# Patient Record
Sex: Male | Born: 1950
Health system: Southern US, Community
[De-identification: ages and names within clinical notes are randomized; demographics above are authoritative.]

## PROBLEM LIST (undated history)

## (undated) DIAGNOSIS — K2970 Gastritis, unspecified, without bleeding: Secondary | ICD-10-CM

## (undated) DIAGNOSIS — K3189 Other diseases of stomach and duodenum: Secondary | ICD-10-CM

## (undated) DIAGNOSIS — I10 Essential (primary) hypertension: Secondary | ICD-10-CM

## (undated) DIAGNOSIS — Z86010 Personal history of colonic polyps: Secondary | ICD-10-CM

## (undated) DIAGNOSIS — M1990 Unspecified osteoarthritis, unspecified site: Secondary | ICD-10-CM

## (undated) DIAGNOSIS — I420 Dilated cardiomyopathy: Secondary | ICD-10-CM

## (undated) DIAGNOSIS — R600 Localized edema: Secondary | ICD-10-CM

## (undated) DIAGNOSIS — I482 Chronic atrial fibrillation, unspecified: Secondary | ICD-10-CM

## (undated) DIAGNOSIS — I5022 Chronic systolic (congestive) heart failure: Secondary | ICD-10-CM

## (undated) DIAGNOSIS — B9681 Helicobacter pylori [H. pylori] as the cause of diseases classified elsewhere: Secondary | ICD-10-CM

## (undated) DIAGNOSIS — K5730 Diverticulosis of large intestine without perforation or abscess without bleeding: Secondary | ICD-10-CM

## (undated) DIAGNOSIS — K766 Portal hypertension: Secondary | ICD-10-CM

## (undated) DIAGNOSIS — F1010 Alcohol abuse, uncomplicated: Secondary | ICD-10-CM

## (undated) DIAGNOSIS — K2990 Gastroduodenitis, unspecified, without bleeding: Secondary | ICD-10-CM

## (undated) DIAGNOSIS — D61818 Other pancytopenia: Secondary | ICD-10-CM

## (undated) DIAGNOSIS — K709 Alcoholic liver disease, unspecified: Secondary | ICD-10-CM

## (undated) DIAGNOSIS — E669 Obesity, unspecified: Secondary | ICD-10-CM

## (undated) HISTORY — DX: Other diseases of stomach and duodenum: K31.89

## (undated) HISTORY — DX: Chronic systolic (congestive) heart failure: I50.22

## (undated) HISTORY — DX: Helicobacter pylori (H. pylori) as the cause of diseases classified elsewhere: B96.81

## (undated) HISTORY — DX: Chronic atrial fibrillation, unspecified: I48.20

## (undated) HISTORY — DX: Portal hypertension: K76.6

## (undated) HISTORY — DX: Gastroduodenitis, unspecified, without bleeding: K29.90

## (undated) HISTORY — DX: Localized edema: R60.0

## (undated) HISTORY — DX: Essential (primary) hypertension: I10

## (undated) HISTORY — DX: Alcohol abuse, uncomplicated: F10.10

## (undated) HISTORY — DX: Other pancytopenia: D61.818

## (undated) HISTORY — DX: Alcoholic liver disease, unspecified: K70.9

## (undated) HISTORY — DX: Gastritis, unspecified, without bleeding: K29.70

## (undated) HISTORY — DX: Personal history of colonic polyps: Z86.010

## (undated) HISTORY — DX: Dilated cardiomyopathy: I42.0

## (undated) HISTORY — DX: Diverticulosis of large intestine without perforation or abscess without bleeding: K57.30

---

## 2003-06-28 ENCOUNTER — Emergency Department (HOSPITAL_COMMUNITY): Admission: EM | Admit: 2003-06-28 | Discharge: 2003-06-28 | Payer: Self-pay | Admitting: Emergency Medicine

## 2003-06-30 ENCOUNTER — Emergency Department (HOSPITAL_COMMUNITY): Admission: EM | Admit: 2003-06-30 | Discharge: 2003-06-30 | Payer: Self-pay

## 2004-08-20 ENCOUNTER — Encounter: Admission: RE | Admit: 2004-08-20 | Discharge: 2004-08-20 | Payer: Self-pay | Admitting: Internal Medicine

## 2004-09-22 ENCOUNTER — Ambulatory Visit: Payer: Self-pay | Admitting: "Endocrinology

## 2013-06-13 ENCOUNTER — Ambulatory Visit: Payer: Self-pay | Admitting: Sports Medicine

## 2013-06-21 ENCOUNTER — Ambulatory Visit (INDEPENDENT_AMBULATORY_CARE_PROVIDER_SITE_OTHER): Payer: 59 | Admitting: Sports Medicine

## 2013-06-21 ENCOUNTER — Encounter: Payer: Self-pay | Admitting: Sports Medicine

## 2013-06-21 VITALS — BP 157/95 | Ht 73.0 in | Wt 315.0 lb

## 2013-06-21 DIAGNOSIS — M25569 Pain in unspecified knee: Secondary | ICD-10-CM

## 2013-06-21 DIAGNOSIS — G8929 Other chronic pain: Secondary | ICD-10-CM

## 2013-06-21 NOTE — Progress Notes (Signed)
   Subjective:    Patient ID: Thomas [name], male    DOB: 04-05-1951, 63 y.o.   MRN: [id number]  HPI chief complaint: Bilateral knee pain  Very pleasant 63 year old male comes in today requesting bilateral knee injections. He has been a patient of mine in the past at [place] performed injections before with good symptom relief. Last injections were thought to be about 2 years ago. He describes a diffuse aching discomfort in each knee. Some swelling in the left knee. No recent trauma. No fevers or chills. Symptoms improve at rest but do not resolve  Patient denies any significant past medical history and is on no chronic medications but the patient also does not have a PCP No known drug allergies    Review of Systems     Objective:   Physical Exam Obese, no acute distress. Awake alert and oriented x3. Vital signs are reviewed  Left knee: Range of motion 0-110. 1+ boggy synovitis. No significant effusion. Diffuse tenderness to palpation. Pain but no popping with McMurray's.  Right knee: Range of motion 0-120. No effusion. Diffuse tenderness to palpation. Pain but no popping with McMurray's.  There is bilateral lower extremity edema, 1-2+ on the right about halfway up the lower leg. He has 3+ pitting edema involving the entire left lower leg up to the knee.  Neurovascularly intact distally.  Walking with an obvious limp       Assessment & Plan:  Bilateral knee pain secondary to DJD Bilateral lower extremity edema Elevated blood pressure  I've injected each of his knees with cortisone today. The left knee was injected using an anterior lateral approach. The right knee was injected using an anterior medial approach. He tolerated this without difficulty. He will sign a records release for me to get the records from [facility]. I've also referred him to [facility] to get established there is a patient. He desperately needs  a PCP to evaluate and treat his lower extremity edema. If knee pain persists despite today's injections I would start with updated x-rays of each knee. He will followup with me for ongoing or recalcitrant issues.  Consent obtained and verified. Time-out conducted. Noted no overlying erythema, induration, or other signs of local infection. Skin prepped in a sterile fashion. Topical analgesic spray: Ethyl chloride. Joint: left knee Needle: 22g 1.5 inch Completed without difficulty. Meds: 3cc 1% xylocaine, 1cc (40mg ) depomedrol  Consent obtained and verified. Time-out conducted. Noted no overlying erythema, induration, or other signs of local infection. Skin prepped in a sterile fashion. Topical analgesic spray: Ethyl chloride. Joint: right knee Needle: 22g 1.5 inch Completed without difficulty. Meds: 3cc 1% xylocaine, 1cc (40mg ) depomedrol  Advised to call if fevers/chills, erythema, induration, drainage, or persistent bleeding.   Advised to call if fevers/chills, erythema, induration, drainage, or persistent bleeding.

## 2013-07-12 ENCOUNTER — Encounter: Payer: Self-pay | Admitting: Sports Medicine

## 2013-07-12 ENCOUNTER — Ambulatory Visit (INDEPENDENT_AMBULATORY_CARE_PROVIDER_SITE_OTHER): Payer: 59 | Admitting: Sports Medicine

## 2013-07-12 VITALS — BP 175/104 | HR 77 | Ht 73.0 in | Wt 315.0 lb

## 2013-07-12 DIAGNOSIS — M1610 Unilateral primary osteoarthritis, unspecified hip: Secondary | ICD-10-CM

## 2013-07-12 DIAGNOSIS — M169 Osteoarthritis of hip, unspecified: Secondary | ICD-10-CM

## 2013-07-12 NOTE — Progress Notes (Signed)
   Subjective:    Patient ID: Thomas [name], male    DOB: Jan 02, 1951, 63 y.o.   MRN: [id number]  HPI chief complaint: Right hip pain  Patient comes in today complaining of chronic right hip pain. I've cared for this condition at [facility] in the past. Since his last office visit I have received their notes. 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 He is on long-term disability and has been so since 2006. His symptoms are tolerable with over-the-counter NSAIDs. Pain is diffuse in the hip and worse with activity. Some nighttime pain as well. No previous hip surgeries. In regards to his bilateral knee pain, he notes good improvement with recent cortisone injections.    Review of Systems     Objective:   Physical Exam Well-developed, well-nourished. No acute distress. Awake alert and oriented x3. Vital signs are reviewed. Blood pressure is 175/104  Right hip: Limited passive and active range of motion. Reproducible groin pain with internal rotation. Mild to moderate quad atrophy. Weakness with resisted hip flexion and abduction. Neurovascularly intact distally. Walking with a limp.       Assessment & Plan:  1. Right hip pain secondary to end-stage DJD 2. Improved bilateral knee pain secondary to DJD 3. Elevated blood pressure, likely hypertension  Patient's right hip pain is not severe enough at this point in time that he wants any aggressive treatment. He is happy continuing with his when necessary over-the-counter NSAIDs. I will fill out the appropriate paperwork for his long-term disability. Patient will followup with me in one year or sooner if necessary. I once again discussed the importance of him establishing care with a primary care physician. I expressed my concern about his blood pressure. I provided him with the names of several physicians at [facility]. He reassures me that he will call them for an appointment.

## 2013-08-17 ENCOUNTER — Emergency Department (HOSPITAL_COMMUNITY): Payer: 59

## 2013-08-17 ENCOUNTER — Encounter (HOSPITAL_COMMUNITY): Payer: Self-pay | Admitting: Emergency Medicine

## 2013-08-17 ENCOUNTER — Inpatient Hospital Stay (HOSPITAL_COMMUNITY)
Admission: EM | Admit: 2013-08-17 | Discharge: 2013-08-24 | DRG: 308 | Disposition: A | Discharge status: Home / Self Care | Payer: 59 | Attending: Internal Medicine | Admitting: Internal Medicine

## 2013-08-17 DIAGNOSIS — D61818 Other pancytopenia: Secondary | ICD-10-CM

## 2013-08-17 DIAGNOSIS — I482 Chronic atrial fibrillation, unspecified: Secondary | ICD-10-CM | POA: Diagnosis present

## 2013-08-17 DIAGNOSIS — I5021 Acute systolic (congestive) heart failure: Secondary | ICD-10-CM

## 2013-08-17 DIAGNOSIS — R1310 Dysphagia, unspecified: Secondary | ICD-10-CM | POA: Diagnosis present

## 2013-08-17 DIAGNOSIS — K2900 Acute gastritis without bleeding: Secondary | ICD-10-CM | POA: Diagnosis present

## 2013-08-17 DIAGNOSIS — R195 Other fecal abnormalities: Secondary | ICD-10-CM

## 2013-08-17 DIAGNOSIS — I5022 Chronic systolic (congestive) heart failure: Secondary | ICD-10-CM | POA: Diagnosis present

## 2013-08-17 DIAGNOSIS — D126 Benign neoplasm of colon, unspecified: Secondary | ICD-10-CM | POA: Diagnosis present

## 2013-08-17 DIAGNOSIS — I5031 Acute diastolic (congestive) heart failure: Secondary | ICD-10-CM | POA: Insufficient documentation

## 2013-08-17 DIAGNOSIS — K2970 Gastritis, unspecified, without bleeding: Secondary | ICD-10-CM

## 2013-08-17 DIAGNOSIS — Z6838 Body mass index (BMI) 38.0-38.9, adult: Secondary | ICD-10-CM

## 2013-08-17 DIAGNOSIS — R0602 Shortness of breath: Secondary | ICD-10-CM

## 2013-08-17 DIAGNOSIS — E876 Hypokalemia: Secondary | ICD-10-CM | POA: Diagnosis present

## 2013-08-17 DIAGNOSIS — K709 Alcoholic liver disease, unspecified: Secondary | ICD-10-CM

## 2013-08-17 DIAGNOSIS — R7989 Other specified abnormal findings of blood chemistry: Secondary | ICD-10-CM

## 2013-08-17 DIAGNOSIS — K766 Portal hypertension: Secondary | ICD-10-CM | POA: Diagnosis present

## 2013-08-17 DIAGNOSIS — F1010 Alcohol abuse, uncomplicated: Secondary | ICD-10-CM

## 2013-08-17 DIAGNOSIS — K3189 Other diseases of stomach and duodenum: Secondary | ICD-10-CM

## 2013-08-17 DIAGNOSIS — I4891 Unspecified atrial fibrillation: Principal | ICD-10-CM | POA: Diagnosis present

## 2013-08-17 DIAGNOSIS — Z860100 Personal history of colon polyps, unspecified: Secondary | ICD-10-CM

## 2013-08-17 DIAGNOSIS — I872 Venous insufficiency (chronic) (peripheral): Secondary | ICD-10-CM | POA: Diagnosis present

## 2013-08-17 DIAGNOSIS — K5730 Diverticulosis of large intestine without perforation or abscess without bleeding: Secondary | ICD-10-CM | POA: Diagnosis present

## 2013-08-17 DIAGNOSIS — Z8249 Family history of ischemic heart disease and other diseases of the circulatory system: Secondary | ICD-10-CM

## 2013-08-17 DIAGNOSIS — I959 Hypotension, unspecified: Secondary | ICD-10-CM | POA: Diagnosis not present

## 2013-08-17 DIAGNOSIS — K449 Diaphragmatic hernia without obstruction or gangrene: Secondary | ICD-10-CM | POA: Diagnosis present

## 2013-08-17 DIAGNOSIS — I5043 Acute on chronic combined systolic (congestive) and diastolic (congestive) heart failure: Secondary | ICD-10-CM | POA: Diagnosis present

## 2013-08-17 DIAGNOSIS — R600 Localized edema: Secondary | ICD-10-CM

## 2013-08-17 DIAGNOSIS — R945 Abnormal results of liver function studies: Secondary | ICD-10-CM

## 2013-08-17 DIAGNOSIS — I420 Dilated cardiomyopathy: Secondary | ICD-10-CM | POA: Diagnosis present

## 2013-08-17 DIAGNOSIS — K2990 Gastroduodenitis, unspecified, without bleeding: Secondary | ICD-10-CM

## 2013-08-17 DIAGNOSIS — F1020 Alcohol dependence, uncomplicated: Secondary | ICD-10-CM | POA: Diagnosis present

## 2013-08-17 DIAGNOSIS — E669 Obesity, unspecified: Secondary | ICD-10-CM | POA: Diagnosis present

## 2013-08-17 DIAGNOSIS — I428 Other cardiomyopathies: Secondary | ICD-10-CM | POA: Diagnosis present

## 2013-08-17 DIAGNOSIS — I509 Heart failure, unspecified: Secondary | ICD-10-CM | POA: Diagnosis present

## 2013-08-17 HISTORY — DX: Obesity, unspecified: E66.9

## 2013-08-17 HISTORY — DX: Unspecified osteoarthritis, unspecified site: M19.90

## 2013-08-17 LAB — ABO/RH: ABO/RH(D): A POS

## 2013-08-17 LAB — DIFFERENTIAL
Basophils Absolute: 0 10*3/uL (ref 0.0–0.1)
Basophils Relative: 0 % (ref 0–1)
Eosinophils Absolute: 0 10*3/uL (ref 0.0–0.7)
Eosinophils Relative: 0 % (ref 0–5)
LYMPHS ABS: 0.3 10*3/uL — AB (ref 0.7–4.0)
LYMPHS PCT: 12 % (ref 12–46)
MONOS PCT: 6 % (ref 3–12)
Monocytes Absolute: 0.2 10*3/uL (ref 0.1–1.0)
NEUTROS ABS: 2.3 10*3/uL (ref 1.7–7.7)
NEUTROS PCT: 82 % — AB (ref 43–77)

## 2013-08-17 LAB — COMPREHENSIVE METABOLIC PANEL
ALK PHOS: 64 U/L (ref 39–117)
ALT: 28 U/L (ref 0–53)
AST: 93 U/L — ABNORMAL HIGH (ref 0–37)
Albumin: 3.4 g/dL — ABNORMAL LOW (ref 3.5–5.2)
BILIRUBIN TOTAL: 1.8 mg/dL — AB (ref 0.3–1.2)
BUN: 23 mg/dL (ref 6–23)
CHLORIDE: 87 meq/L — AB (ref 96–112)
CO2: 24 mEq/L (ref 19–32)
Calcium: 8.8 mg/dL (ref 8.4–10.5)
Creatinine, Ser: 0.7 mg/dL (ref 0.50–1.35)
GFR calc non Af Amer: >90 mL/min (ref >90)
GLUCOSE: 168 mg/dL — AB (ref 70–99)
POTASSIUM: 3.6 meq/L — AB (ref 3.7–5.3)
SODIUM: 133 meq/L — AB (ref 137–147)
TOTAL PROTEIN: 6.8 g/dL (ref 6.0–8.3)

## 2013-08-17 LAB — CBC
HCT: 36.2 % — ABNORMAL LOW (ref 39.0–52.0)
HEMOGLOBIN: 13.1 g/dL (ref 13.0–17.0)
MCH: 36.1 pg — ABNORMAL HIGH (ref 26.0–34.0)
MCHC: 36.2 g/dL — ABNORMAL HIGH (ref 30.0–36.0)
MCV: 99.7 fL (ref 78.0–100.0)
PLATELETS: 49 10*3/uL — AB (ref 150–400)
RBC: 3.63 MIL/uL — AB (ref 4.22–5.81)
RDW: 14.1 % (ref 11.5–15.5)
WBC: 2.7 10*3/uL — AB (ref 4.0–10.5)

## 2013-08-17 LAB — I-STAT TROPONIN, ED: TROPONIN I, POC: 0.01 ng/mL (ref 0.00–0.08)

## 2013-08-17 LAB — D-DIMER, QUANTITATIVE (NOT AT ARMC): D DIMER QUANT: 1.52 ug{FEU}/mL — AB (ref 0.00–0.48)

## 2013-08-17 LAB — MAGNESIUM: MAGNESIUM: 1.5 mg/dL (ref 1.5–2.5)

## 2013-08-17 LAB — PRO B NATRIURETIC PEPTIDE: Pro B Natriuretic peptide (BNP): 1234 pg/mL — ABNORMAL HIGH (ref 0–125)

## 2013-08-17 LAB — TSH: TSH: 1.65 u[IU]/mL (ref 0.350–4.500)

## 2013-08-17 MED ORDER — LORAZEPAM 2 MG/ML IJ SOLN
1.0000 mg | Freq: Four times a day (QID) | INTRAMUSCULAR | Status: AC | PRN
  Administered 2013-08-17: 1 mg via INTRAVENOUS
  Filled 2013-08-17: qty 1

## 2013-08-17 MED ORDER — ADULT MULTIVITAMIN W/MINERALS CH
1.0000 | ORAL_TABLET | Freq: Every day | ORAL | Status: DC
  Administered 2013-08-18 – 2013-08-24 (×8): 1 via ORAL
  Filled 2013-08-17 (×8): qty 1

## 2013-08-17 MED ORDER — METOPROLOL TARTRATE 1 MG/ML IV SOLN
2.5000 mg | INTRAVENOUS | Status: AC
  Administered 2013-08-17: 2.5 mg via INTRAVENOUS
  Filled 2013-08-17: qty 5

## 2013-08-17 MED ORDER — DILTIAZEM HCL 100 MG IV SOLR
5.0000 mg/h | INTRAVENOUS | Status: DC
  Administered 2013-08-17: 5 mg/h via INTRAVENOUS
  Filled 2013-08-17: qty 100

## 2013-08-17 MED ORDER — SODIUM CHLORIDE 0.9 % IV BOLUS (SEPSIS)
500.0000 mL | Freq: Once | INTRAVENOUS | Status: AC
  Administered 2013-08-17: 500 mL via INTRAVENOUS

## 2013-08-17 MED ORDER — FOLIC ACID 1 MG PO TABS
1.0000 mg | ORAL_TABLET | Freq: Every day | ORAL | Status: DC
  Administered 2013-08-18: 1 mg via ORAL
  Filled 2013-08-17 (×2): qty 1

## 2013-08-17 MED ORDER — POTASSIUM CHLORIDE CRYS ER 20 MEQ PO TBCR
40.0000 meq | EXTENDED_RELEASE_TABLET | Freq: Once | ORAL | Status: AC
  Administered 2013-08-18: 40 meq via ORAL
  Filled 2013-08-17: qty 2

## 2013-08-17 MED ORDER — VITAMIN B-1 100 MG PO TABS
100.0000 mg | ORAL_TABLET | Freq: Every day | ORAL | Status: DC
  Administered 2013-08-18 – 2013-08-24 (×7): 100 mg via ORAL
  Filled 2013-08-17 (×8): qty 1

## 2013-08-17 MED ORDER — METOPROLOL TARTRATE 12.5 MG HALF TABLET
12.5000 mg | ORAL_TABLET | Freq: Two times a day (BID) | ORAL | Status: DC
  Administered 2013-08-18 (×2): 12.5 mg via ORAL
  Filled 2013-08-17 (×5): qty 1

## 2013-08-17 MED ORDER — FUROSEMIDE 10 MG/ML IJ SOLN
40.0000 mg | Freq: Every day | INTRAMUSCULAR | Status: DC
  Administered 2013-08-17 – 2013-08-22 (×6): 40 mg via INTRAVENOUS
  Filled 2013-08-17 (×6): qty 4

## 2013-08-17 MED ORDER — LORAZEPAM 1 MG PO TABS: 1.0000 mg | ORAL_TABLET | Freq: Four times a day (QID) | ORAL | Status: AC | PRN

## 2013-08-17 MED ORDER — THIAMINE HCL 100 MG/ML IJ SOLN
100.0000 mg | Freq: Every day | INTRAMUSCULAR | Status: DC
  Administered 2013-08-18: 12:00:00 via INTRAVENOUS
  Filled 2013-08-17 (×7): qty 1

## 2013-08-17 NOTE — H&P (Signed)
Thomas [name] is an 63 y.o. male.   Chief Complaint: Afib HPI:   The patient is a 6 male with a history of obesity, ETOH abuse and arthritis.  He reports feeling dizzy and wobbly in the legs for the last two days, 2 weeks of LEE a lot worse in the left leg and some dark tarry stools for two weeks.  He reports a decreased appetite as well.  He takes no medications and has not seen a PCP in a long time.  The patient currently denies nausea, vomiting, fever, chest pain, shortness of breath, orthopnea, dizziness, PND, cough, congestion, abdominal pain, hematochezia, melena, lower extremity edema, claudication.   Past Medical History  Diagnosis Date  . Obesity   . Arthritis     History reviewed. No pertinent past surgical history.  Family History  Problem Relation Age of Onset  . Hypertension Father    Social History:  reports that he has never smoked. He has never used smokeless tobacco. He reports that he drinks about 18 ounces of alcohol per week. He reports that he does not use illicit drugs.  Allergies: No Known Allergies   (Not in a hospital admission)  Results for orders placed during the hospital encounter of 08/17/13 (from the past 48 hour(s))  CBC     Status: Abnormal   Collection Time    08/17/13  1:40 PM      Result Value Ref Range   WBC 2.7 (*) 4.0 - 10.5 K/uL   Comment: REPEATED TO VERIFY   RBC 3.63 (*) 4.22 - 5.81 MIL/uL   Hemoglobin 13.1  13.0 - 17.0 g/dL   HCT 36.2 (*) 39.0 - 52.0 %   MCV 99.7  78.0 - 100.0 fL   MCH 36.1 (*) 26.0 - 34.0 pg   MCHC 36.2 (*) 30.0 - 36.0 g/dL   RDW 14.1  11.5 - 15.5 %   Platelets 49 (*) 150 - 400 K/uL   Comment: REPEATED TO VERIFY     SPECIMEN CHECKED FOR CLOTS     PLATELET COUNT CONFIRMED BY SMEAR  DIFFERENTIAL     Status: Abnormal   Collection Time    08/17/13  1:40 PM      Result Value Ref Range   Neutrophils Relative % 82 (*) 43 - 77 %   Neutro Abs 2.3  1.7 - 7.7 K/uL   Lymphocytes Relative 12  12 - 46 %   Lymphs  Abs 0.3 (*) 0.7 - 4.0 K/uL   Monocytes Relative 6  3 - 12 %   Monocytes Absolute 0.2  0.1 - 1.0 K/uL   Eosinophils Relative 0  0 - 5 %   Eosinophils Absolute 0.0  0.0 - 0.7 K/uL   Basophils Relative 0  0 - 1 %   Basophils Absolute 0.0  0.0 - 0.1 K/uL  COMPREHENSIVE METABOLIC PANEL     Status: Abnormal   Collection Time    08/17/13  1:40 PM      Result Value Ref Range   Sodium 133 (*) 137 - 147 mEq/L   Potassium 3.6 (*) 3.7 - 5.3 mEq/L   Chloride 87 (*) 96 - 112 mEq/L   CO2 24  19 - 32 mEq/L   Glucose, Bld 168 (*) 70 - 99 mg/dL   BUN 23  6 - 23 mg/dL   Creatinine, Ser 0.70  0.50 - 1.35 mg/dL   Calcium 8.8  8.4 - 10.5 mg/dL   Total Protein 6.8  6.0 -  8.3 g/dL   Albumin 3.4 (*) 3.5 - 5.2 g/dL   AST 93 (*) 0 - 37 U/L   ALT 28  0 - 53 U/L   Alkaline Phosphatase 64  39 - 117 U/L   Total Bilirubin 1.8 (*) 0.3 - 1.2 mg/dL   GFR calc non Af Amer >90  >90 mL/min   GFR calc Af Amer >90  >90 mL/min   Comment: (NOTE)     The eGFR has been calculated using the CKD EPI equation.     This calculation has not been validated in all clinical situations.     eGFR's persistently <90 mL/min signify possible Chronic Kidney     Disease.  PRO B NATRIURETIC PEPTIDE     Status: Abnormal   Collection Time    08/17/13  1:46 PM      Result Value Ref Range   Pro B Natriuretic peptide (BNP) 1234.0 (*) 0 - 125 pg/mL  I-STAT TROPOININ, ED     Status: None   Collection Time    08/17/13  2:16 PM      Result Value Ref Range   Troponin i, poc 0.01  0.00 - 0.08 ng/mL   Comment 3            Comment: Due to the release kinetics of cTnI,     a negative result within the first hours     of the onset of symptoms does not rule out     myocardial infarction with certainty.     If myocardial infarction is still suspected,     repeat the test at appropriate intervals.   Dg Chest 2 View  08/17/2013   CLINICAL DATA:  Shortness of breath. Dizziness. Chest pain. Lower extremity swelling.  EXAM: CHEST  2 VIEW   COMPARISON:  None.  FINDINGS: The heart size and mediastinal contours are within normal limits. Both lungs are clear. The visualized skeletal structures are unremarkable.  IMPRESSION: No active cardiopulmonary disease.   Electronically Signed   By: Earle Gell M.D.   On: 08/17/2013 15:02    Review of Systems  Constitutional: Negative for fever and diaphoresis.  HENT: Negative for congestion.   Respiratory: Positive for cough and shortness of breath (with exertion). Negative for sputum production.   Cardiovascular: Positive for leg swelling. Negative for chest pain, palpitations, orthopnea and PND.  Gastrointestinal: Positive for melena. Negative for nausea, vomiting, abdominal pain and blood in stool.  Genitourinary: Negative for hematuria.  Musculoskeletal: Positive for joint pain (Hip and knee).  Neurological: Positive for dizziness.  All other systems reviewed and are negative.   Blood pressure 114/70, pulse 57, temperature 97.8 F (36.6 C), temperature source Oral, resp. rate 15, height 6' (1.829 m), weight 330 lb (149.687 kg), SpO2 98.00%. Physical Exam  Nursing note and vitals reviewed. Constitutional: He is oriented to person, place, and time. He appears well-developed. No distress.  Obese  HENT:  Head: Normocephalic and atraumatic.  Mouth/Throat: Oropharynx is clear and moist. No oropharyngeal exudate.  Eyes: EOM are normal. Pupils are equal, round, and reactive to light. No scleral icterus.  Neck: Normal range of motion. Neck supple.  Cardiovascular: S1 normal and S2 normal.  An irregularly irregular rhythm present. Tachycardia present.   No murmur heard. Pulses:      Radial pulses are 2+ on the right side, and 2+ on the left side.       Dorsalis pedis pulses are 2+ on the right side, and 2+ on the  left side.  No carotid Bruit  Respiratory: Effort normal and breath sounds normal. He has no wheezes. He has no rales.  GI: Soft. Bowel sounds are normal. He exhibits no  distension. There is no tenderness.  Musculoskeletal: He exhibits edema.  Left leg has 3+ pitting edema and trace in the right.  Neurological: He is alert and oriented to person, place, and time. He exhibits normal muscle tone.  Skin: Skin is warm and dry.  Psychiatric: He has a normal mood and affect.     Assessment/Plan Principal Problem:   Atrial fibrillation with RVR His BP is limiting the use of cardizem.  Will try lopressor.   May need to start amiodarone.  Hematology consult before adding heparin.  TEE/DCCV at some point.  Ischemic eval.  Check Echo.   Active Problems:   SOB (shortness of breath)   Acute diastolic congestive heart failure  BNP elevated will add lasix IV 40 mg daily.  Checking Echo   Pancytopenia  Hematology consult requested.  Consider megaloblastic anemia from excessive ETOH.    ETOH abuse  Check a G98 and folic acid.   CIWA order set.   Hypokalemia  Replace   Edema of left lower extremity  Severe edema compared to right leg. Check ddimer.  LE venous dopplers.   Elevated AST  Could be from [place], [facility] 08/17/2013, 4:58 PM

## 2013-08-17 NOTE — Consult Note (Signed)
[place] [phone]   [phone]  CONSULT NOTE  REFERRING PHYSICIAN: Sueanne Margarita, MD  REASON FOR CONSULTATION:   To aid in the management thrombocytopenia and leukopenia   HPI Thomas [name] is a 63 y.o. male with  history of ETOH abuse,  Obesity, lower extremity edema and DJD who started feeling  dizzy for the past two days,  Progressively worsening bilateral lower extremity swelling left more than the right for the past two weeks. He also c/o passing bright red blood in the stool with intermittent black tarry stool for the past 2 weeks. He reports  decreased appetite as well. He takes aleve for his right hip pain. He says he has not seen a PCP in a long time.In ER he was found to have Afib with RVR and cardiology was consulted. His labs revealed wbc of 2.7 with ANC of 2300, platelet count 49000. He also c/o intermittent gum bleeding while brushing teeth.   The patient currently denies nausea, vomiting, fever, chest pain, shortness of breath, orthopnea, PND, cough, congestion, abdominal pain.He c/o discomfort in both the lower extremities   Past Medical History  Diagnosis Date  . Obesity   . Arthritis     History reviewed. No pertinent past surgical history.  Family History  Problem Relation Age of Onset  . Hypertension Father     Social History History  Substance Use Topics  . Smoking status: Never Smoker   . Smokeless tobacco: Never Used  . Alcohol Use: 18.0 oz/week    30 Cans of beer per week     Comment: daily    No Known Allergies  Current Facility-Administered Medications  Medication Dose Route Frequency Provider Last Rate Last Dose  . diltiazem (CARDIZEM) 100 mg in dextrose 5 % 100 mL infusion  5-15 mg/hr Intravenous Titrated Charles B. Karle Starch, MD   10 mg/hr at 08/17/13 1509    Review of Systems  A 10 point review of symptoms were assessed and pertinent symptoms were mentioned in HPI  Physical  Exam  GENERAL:alert and oriented x3,  no distress, obese and well developed SKIN: no rashes or significant lesions HEAD: Normocephalic, atraumatic EYES: PERRLA, EOMI, Conjunctiva are pink and non-injected, sclera slightly icteric EARS: External ears normal OROPHARYNX:no erythema, lips, buccal mucosa, and tongue normal and mucous membranes are moist  NECK: supple, no adenopathy, no JVD, no stridor, non-tender LYMPH:  ?left supraclavicular  lymphadenopathy,  LUNGS: poor inspiratory effort, coarse sounds heard anteriorly HEART: positive for irregular rhythem and tachycardia ABDOMEN: soft, obese and normal bowel sounds,no hepatosplenomegaly EXTREMITIES: Bil. Lower extremity edema left more than the right. no clubbing and no cyanosis  NEURO: alert & oriented x 3 with fluent speech, no focal motor/sensory deficits, gait normal   PERFORMANCE STATUS: ECOG 2 - Symptomatic, <50% confined to bed   LABORATORY DATA: Lab Results  Component Value Date   WBC 2.7* 08/17/2013   HGB 13.1 08/17/2013   HCT 36.2* 08/17/2013   MCV 99.7 08/17/2013   PLT 49* 08/17/2013     RADIOGRAPHIC STUDIES: Dg Chest 2 View  08/17/2013   CLINICAL DATA:  Shortness of breath. Dizziness. Chest pain. Lower extremity swelling.  EXAM: CHEST  2 VIEW  COMPARISON:  None.  FINDINGS: The heart size and mediastinal contours are within normal limits. Both lungs are clear. The visualized skeletal structures are unremarkable.  IMPRESSION: No active cardiopulmonary disease.   Electronically Signed   By: Earle Gell M.D.   On: 08/17/2013 15:02  ASSESSMENT/PLAN: 1. Pancytopenia most likely secondary alcohol induced bonemarrow suppression compounded by NSAID use. F/u with vitamin b12, TSH,  ANA levels, folate, retic count and LDH levels to r/o other etiologies.  2. F/u with stool occult and recommend GI evaluation   3.He can be started on anticoagulation for AFIB, once cleared by GI and platelet count improves at least to  60,000  4. Two units of platelet transfusion were ordered  5. Will obtain ultrasound of neck to assess for left supra clavicular lymphadenopathy  6. F/u with bil.  venous doppler studies ordered to R/o DVT  7. Counselled on stop using alcohol and to avoid NSAID's  8. Monitor daily CBC and DIff  Thank you so much for allowing me to participate in the care of Thomas [name]. I will continue to follow up the patient with you and assist in his care.  I spent 35 minutes counseling the patient face to face. The total time spent in the appointment was 50 minutes    Wilmon Arms, MD Hematology/ Medical Oncology 08/17/2013, 7:23 PM

## 2013-08-17 NOTE — ED Provider Notes (Signed)
CSN: [id number]     Arrival date & time 08/17/13  1323 History   First MD Initiated Contact with Patient 08/17/13 1409     Chief Complaint  Patient presents with  . Leg Pain  . Dizziness     (Consider location/radiation/quality/duration/timing/severity/associated sxs/prior Treatment) Patient is a 63 y.o. male presenting with leg pain and dizziness.  Leg Pain Dizziness  Pt with no known PMH who does not go to a doctor regularly reports about a week of general weakness, legs giving out from time to time but no CP, SOB, vomiting. He was feeling particularly bad today and so came to the ED for eval where he was found to be in atrial fibrillation with RVR.   Past Medical History  Diagnosis Date  . Obesity   . Arthritis    History reviewed. No pertinent past surgical history. Family History  Problem Relation Age of Onset  . Hypertension Father    History  Substance Use Topics  . Smoking status: Never Smoker   . Smokeless tobacco: Never Used  . Alcohol Use: 18.0 oz/week    30 Cans of beer per week     Comment: daily    Review of Systems  Neurological: Positive for dizziness.   All other systems reviewed and are negative except as noted in HPI.     Allergies  Review of patient's allergies indicates no known allergies.  Home Medications   Prior to Admission medications   Medication Sig Start Date End Date Taking? Authorizing Provider  naproxen sodium (ANAPROX) 220 MG tablet Take 220 mg by mouth every 8 (eight) hours as needed (pain).   Yes Historical Provider, MD   BP 94/59  Pulse 120  Temp(Src) 97.8 F (36.6 C) (Oral)  Resp 13  Ht 6' (1.829 m)  Wt 330 lb (149.687 kg)  BMI 44.75 kg/m2  SpO2 100% Physical Exam  Nursing note and vitals reviewed. Constitutional: He is oriented to person, place, and time. He appears well-developed and well-nourished.  HENT:  Head: Normocephalic and atraumatic.  Eyes: EOM are normal. Pupils are equal, round, and reactive to light.   Neck: Normal range of motion. Neck supple.  Cardiovascular: Normal heart sounds and intact distal pulses.  Tachycardia present.   Pulmonary/Chest: Effort normal and breath sounds normal.  Abdominal: Bowel sounds are normal. He exhibits no distension. There is no tenderness.  Musculoskeletal: Normal range of motion. He exhibits no edema and no tenderness.  Neurological: He is alert and oriented to person, place, and time. He has normal strength. No cranial nerve deficit or sensory deficit.  Skin: Skin is warm and dry. No rash noted.  Psychiatric: He has a normal mood and affect.    ED Course  Procedures (including critical care time) Labs Review Labs Reviewed  CBC - Abnormal; Notable for the following:    WBC 2.7 (*)    RBC 3.63 (*)    HCT 36.2 (*)    MCH 36.1 (*)    MCHC 36.2 (*)    Platelets 49 (*)    All other components within normal limits  DIFFERENTIAL - Abnormal; Notable for the following:    Neutrophils Relative % 82 (*)    Lymphs Abs 0.3 (*)    All other components within normal limits  COMPREHENSIVE METABOLIC PANEL - Abnormal; Notable for the following:    Sodium 133 (*)    Potassium 3.6 (*)    Chloride 87 (*)    Glucose, Bld 168 (*)  Albumin 3.4 (*)    AST 93 (*)    Total Bilirubin 1.8 (*)    All other components within normal limits  PRO B NATRIURETIC PEPTIDE - Abnormal; Notable for the following:    Pro B Natriuretic peptide (BNP) 1234.0 (*)    All other components within normal limits  Randolm Idol, ED    Imaging Review Dg Chest 2 View  08/17/2013   CLINICAL DATA:  Shortness of breath. Dizziness. Chest pain. Lower extremity swelling.  EXAM: CHEST  2 VIEW  COMPARISON:  None.  FINDINGS: The heart size and mediastinal contours are within normal limits. Both lungs are clear. The visualized skeletal structures are unremarkable.  IMPRESSION: No active cardiopulmonary disease.   Electronically Signed   By: Earle Gell M.D.   On: 08/17/2013 15:02     EKG  Interpretation   Date/Time:  Thursday August 17 2013 13:43:14 EDT Ventricular Rate:  159 PR Interval:    QRS Duration: 86 QT Interval:  308 QTC Calculation: 501 R Axis:   22 Text Interpretation:  Atrial fibrillation with rapid ventricular response  with premature ventricular or aberrantly conducted complexes Minimal  voltage criteria for LVH, may be normal variant Abnormal ECG No old  tracing to compare Confirmed by [facility]  MD, Juanda Crumble [phone]) on 08/17/2013  2:08:58 PM      MDM   Final diagnoses:  Atrial fibrillation with rapid ventricular response    Pt started on cardizem drip, but BP dropped. Drip held and IVF bolus given. Spoke with Cards who will evaluate the patient in the ED.     Charles B. Karle Starch, MD 08/17/13 [id number]

## 2013-08-17 NOTE — H&P (Signed)
Patient seen, interviewed and examined and chart reviewed. Presented to the ER with complaints of feeling dizzy and unsteady on his feet for several days.  He also complains of LLE edema and dark stools for several weeks.  He drinks a significant amount of Alcohol up to 30 beers weekly.  Found in ER to have rapid afib and was placed on IV Cardizem but dropped his BP and was stopped.  Currently his HR is 130bpm and SBP back to 120's off Cardizem.  He has 1+ edema of the RLE and 3+ edema of the LLE.  His labs are remarkable for low WBC at 2.9 and thrombocytopenia which may be due to bone marrow suppression from ETOH abuse.  Will admit to tele bed and try low dose IV Metoprolol.  If HR not controlled then will need to start IV Amio gtt.  At this time will hold on anticoagulation in light of recent history of black stools and thrombocytopenia until seen tonight by Hematology.  Will get a 2D echo to assess LVF.  Start IV Lasix for acute diastolic CHF with elevated proBNP and LE edema.  LE venous doppler of LLE to rule out DVT.  DT prophylaxis.  SCD's for DVT prophylaxis once DVT ruled out by LE venous doppler

## 2013-08-17 NOTE — ED Notes (Addendum)
Pt reports bilateral leg pain and dizziness x 2 days. Reports difficulty ambulating due to his legs "not working right." grips equal, no facial droop or arm drift noted at triage. HR 159 at triage, denies hx of afib

## 2013-08-18 ENCOUNTER — Inpatient Hospital Stay (HOSPITAL_COMMUNITY): Payer: 59

## 2013-08-18 DIAGNOSIS — F1010 Alcohol abuse, uncomplicated: Secondary | ICD-10-CM

## 2013-08-18 DIAGNOSIS — I059 Rheumatic mitral valve disease, unspecified: Secondary | ICD-10-CM

## 2013-08-18 DIAGNOSIS — K709 Alcoholic liver disease, unspecified: Secondary | ICD-10-CM | POA: Diagnosis present

## 2013-08-18 DIAGNOSIS — R609 Edema, unspecified: Secondary | ICD-10-CM

## 2013-08-18 DIAGNOSIS — E876 Hypokalemia: Secondary | ICD-10-CM

## 2013-08-18 DIAGNOSIS — R195 Other fecal abnormalities: Secondary | ICD-10-CM | POA: Diagnosis present

## 2013-08-18 DIAGNOSIS — D61818 Other pancytopenia: Secondary | ICD-10-CM

## 2013-08-18 DIAGNOSIS — R791 Abnormal coagulation profile: Secondary | ICD-10-CM

## 2013-08-18 LAB — CBC WITH DIFFERENTIAL/PLATELET
BASOS ABS: 0 10*3/uL (ref 0.0–0.1)
Basophils Relative: 1 % (ref 0–1)
Eosinophils Absolute: 0 10*3/uL (ref 0.0–0.7)
Eosinophils Relative: 1 % (ref 0–5)
HCT: 33.4 % — ABNORMAL LOW (ref 39.0–52.0)
Hemoglobin: 11.5 g/dL — ABNORMAL LOW (ref 13.0–17.0)
Lymphocytes Relative: 27 % (ref 12–46)
Lymphs Abs: 0.7 10*3/uL (ref 0.7–4.0)
MCH: 35.4 pg — ABNORMAL HIGH (ref 26.0–34.0)
MCHC: 34.4 g/dL (ref 30.0–36.0)
MCV: 102.8 fL — ABNORMAL HIGH (ref 78.0–100.0)
Monocytes Absolute: 0.2 10*3/uL (ref 0.1–1.0)
Monocytes Relative: 7 % (ref 3–12)
NEUTROS ABS: 1.5 10*3/uL — AB (ref 1.7–7.7)
NEUTROS PCT: 64 % (ref 43–77)
Platelets: 79 10*3/uL — ABNORMAL LOW (ref 150–400)
RBC: 3.25 MIL/uL — ABNORMAL LOW (ref 4.22–5.81)
RDW: 14.2 % (ref 11.5–15.5)
WBC: 2.4 10*3/uL — ABNORMAL LOW (ref 4.0–10.5)

## 2013-08-18 LAB — BASIC METABOLIC PANEL
BUN: 20 mg/dL (ref 6–23)
CHLORIDE: 92 meq/L — AB (ref 96–112)
CO2: 32 meq/L (ref 19–32)
Calcium: 8.5 mg/dL (ref 8.4–10.5)
Creatinine, Ser: 0.8 mg/dL (ref 0.50–1.35)
GFR calc non Af Amer: >90 mL/min (ref >90)
GLUCOSE: 102 mg/dL — AB (ref 70–99)
Potassium: 3.2 mEq/L — ABNORMAL LOW (ref 3.7–5.3)
Sodium: 139 mEq/L (ref 137–147)

## 2013-08-18 LAB — LACTATE DEHYDROGENASE: LDH: 476 U/L — ABNORMAL HIGH (ref 94–250)

## 2013-08-18 LAB — CBC
HCT: 35.6 % — ABNORMAL LOW (ref 39.0–52.0)
Hemoglobin: 12.5 g/dL — ABNORMAL LOW (ref 13.0–17.0)
MCH: 35.6 pg — ABNORMAL HIGH (ref 26.0–34.0)
MCHC: 35.1 g/dL (ref 30.0–36.0)
MCV: 101.4 fL — AB (ref 78.0–100.0)
PLATELETS: 88 10*3/uL — AB (ref 150–400)
RBC: 3.51 MIL/uL — ABNORMAL LOW (ref 4.22–5.81)
RDW: 14.4 % (ref 11.5–15.5)
WBC: 3 10*3/uL — AB (ref 4.0–10.5)

## 2013-08-18 LAB — RETICULOCYTES
RBC.: 3.25 MIL/uL — AB (ref 4.22–5.81)
RETIC CT PCT: 0.3 % — AB (ref 0.4–3.1)
Retic Count, Absolute: 9.8 10*3/uL — ABNORMAL LOW (ref 19.0–186.0)

## 2013-08-18 LAB — VITAMIN B12: VITAMIN B 12: 232 pg/mL (ref 211–911)

## 2013-08-18 LAB — FOLATE: Folate: 3 ng/mL — ABNORMAL LOW

## 2013-08-18 LAB — HEPARIN LEVEL (UNFRACTIONATED): Heparin Unfractionated: 0.72 IU/mL — ABNORMAL HIGH (ref 0.30–0.70)

## 2013-08-18 MED ORDER — AMIODARONE HCL IN DEXTROSE 360-4.14 MG/200ML-% IV SOLN
30.0000 mg/h | INTRAVENOUS | Status: DC
  Administered 2013-08-18 – 2013-08-22 (×6): 30 mg/h via INTRAVENOUS
  Filled 2013-08-18 (×18): qty 200

## 2013-08-18 MED ORDER — PANTOPRAZOLE SODIUM 40 MG PO TBEC
40.0000 mg | DELAYED_RELEASE_TABLET | Freq: Every day | ORAL | Status: DC
  Administered 2013-08-18 – 2013-08-24 (×6): 40 mg via ORAL
  Filled 2013-08-18 (×7): qty 1

## 2013-08-18 MED ORDER — HEPARIN (PORCINE) IN NACL 100-0.45 UNIT/ML-% IJ SOLN
1500.0000 [IU]/h | INTRAMUSCULAR | Status: DC
  Administered 2013-08-18 – 2013-08-20 (×4): 1500 [IU]/h via INTRAVENOUS
  Filled 2013-08-18 (×6): qty 250

## 2013-08-18 MED ORDER — IOHEXOL 350 MG/ML SOLN
70.0000 mL | Freq: Once | INTRAVENOUS | Status: AC | PRN
  Administered 2013-08-18: 70 mL via INTRAVENOUS

## 2013-08-18 MED ORDER — MAGNESIUM SULFATE 40 MG/ML IJ SOLN
2.0000 g | Freq: Once | INTRAMUSCULAR | Status: DC
  Filled 2013-08-18: qty 50

## 2013-08-18 MED ORDER — FOLIC ACID 1 MG PO TABS
5.0000 mg | ORAL_TABLET | Freq: Every day | ORAL | Status: DC
Start: 2013-08-18 — End: 2013-08-24
  Administered 2013-08-18 – 2013-08-24 (×7): 5 mg via ORAL
  Filled 2013-08-18 (×7): qty 5

## 2013-08-18 MED ORDER — HEPARIN BOLUS VIA INFUSION
4000.0000 [IU] | Freq: Once | INTRAVENOUS | Status: AC
  Administered 2013-08-18: 4000 [IU] via INTRAVENOUS
  Filled 2013-08-18: qty 4000

## 2013-08-18 MED ORDER — AMIODARONE IV BOLUS ONLY 150 MG/100ML: 150.0000 mg | Freq: Once | INTRAVENOUS | Status: DC

## 2013-08-18 MED ORDER — POTASSIUM CHLORIDE CRYS ER 20 MEQ PO TBCR
40.0000 meq | EXTENDED_RELEASE_TABLET | Freq: Once | ORAL | Status: AC
  Administered 2013-08-18: 40 meq via ORAL
  Filled 2013-08-18: qty 2

## 2013-08-18 MED ORDER — AMIODARONE HCL IN DEXTROSE 360-4.14 MG/200ML-% IV SOLN
60.0000 mg/h | INTRAVENOUS | Status: AC
  Administered 2013-08-18: 60 mg/h via INTRAVENOUS
  Filled 2013-08-18: qty 200

## 2013-08-18 MED ORDER — AMIODARONE LOAD VIA INFUSION
150.0000 mg | Freq: Once | INTRAVENOUS | Status: AC
  Administered 2013-08-18: 150 mg via INTRAVENOUS
  Filled 2013-08-18: qty 83.34

## 2013-08-18 MED ORDER — CYANOCOBALAMIN 1000 MCG/ML IJ SOLN
1000.0000 ug | Freq: Once | INTRAMUSCULAR | Status: AC
  Administered 2013-08-18: 1000 ug via INTRAMUSCULAR
  Filled 2013-08-18: qty 1

## 2013-08-18 MED ORDER — AMIODARONE LOAD VIA INFUSION
150.0000 mg | Freq: Once | INTRAVENOUS | Status: AC
Start: 2013-08-18 — End: 2013-08-18
  Administered 2013-08-18: 150 mg via INTRAVENOUS
  Filled 2013-08-18: qty 83.34

## 2013-08-18 NOTE — Progress Notes (Addendum)
Nutrition Brief Note  Patient identified on the Malnutrition Screening Tool (MST) Report.  Wt Readings from Last 15 Encounters:  08/18/13 289 lb 1.6 oz (131.135 kg)  07/12/13 315 lb (142.883 kg)  06/21/13 315 lb (142.883 kg)    Body mass index is 38.15 kg/(m^2). Patient meets criteria for Obesity Class II based on current BMI.   Current diet order is 2 gm Sodium, patient is consuming approximately 100% of meals at this time. Labs and medications reviewed.   No nutrition interventions warranted at this time. If nutrition issues arise, please consult RD.   Arthur Holms, RD, LDN Pager #: [phone] After-Hours Pager #: [phone]

## 2013-08-18 NOTE — Progress Notes (Signed)
Patient seen and examined and chart reviewed. Still in afib with RVR despite IV Amio.  Will give an Amio bolus.  Start Heparin (Hematology cleared use of Heparin once platelet count >60).  Check chest CT angio given elevated d-dimer and check LE venous dopplers.  Check 2D echo.  GI consult for dark stools.  Replete potassium.

## 2013-08-18 NOTE — Progress Notes (Addendum)
Thomas [name]  [phone]    HOSPITAL PROGRESS NOTE  No new events overnight. No new complaints. Received 2 units of platelets.  Updated labs:  Platelets today at 79k. H/H 11.5/33.4 (2 g less than prior day). WBC 2.4 with ANC 1.5. vitaminB12 is 232, TSH 1.650, ANA levels pending, folate 3.0, retic count 9.8 manual and LDH levels 476. His MCV is elevated at 102. Ultrasound of the  neck to assess for left supra clavicular lymphadenopathy odered, possibly to be performed today.  D Dimer elevated at 1.52. Left supraclavicular ultrasound not performed to date.  MEDICATIONS:  Scheduled Meds: . cyanocobalamin  1,000 mcg Intramuscular Once  . folic acid  5 mg Oral Daily  . furosemide  40 mg Intravenous Daily  . magnesium sulfate 1 - 4 g bolus IVPB  2 g Intravenous Once  . metoprolol tartrate  12.5 mg Oral Q12H  . multivitamin with minerals  1 tablet Oral Daily  . thiamine  100 mg Oral Daily   Or  . thiamine  100 mg Intravenous Daily   Continuous Infusions: . amiodarone 60 mg/hr (08/18/13 0527)   Followed by  . amiodarone    . diltiazem (CARDIZEM) infusion Stopped (08/17/13 1553)   PRN Meds:.LORazepam, LORazepam ALLERGIES: No Known Allergies   PHYSICAL EXAMINATION:   Filed Vitals:   08/18/13 0541  BP: 88/62  Pulse: 116  Temp: 99.4 F (37.4 C)  Resp: 18   Filed Weights   08/17/13 1329 08/17/13 1852 08/18/13 0500  Weight: 330 lb (149.687 kg) 328 lb 7.8 oz (149 kg) 289 lb 1.6 oz (131.135 kg)   GENERAL:alert and oriented x3, no distress, obese and well developed  SKIN: no rashes or significant lesions  HEAD: Normocephalic, atraumatic  EYES: PERRLA, EOMI, Conjunctiva are pink and non-injected, sclera slightly icteric  EARS: External ears normal  OROPHARYNX:no erythema, lips, buccal mucosa, and tongue normal and mucous membranes are moist  NECK: supple, no adenopathy, no JVD, no stridor, non-tender  LYMPH: ?left supraclavicular  lymphadenopathy LUNGS: poor inspiratory effort, coarse sounds heard anteriorly  HEART: positive for irregular rhythem and tachycardia  ABDOMEN: soft, obese and normal bowel sounds,no hepatosplenomegaly  EXTREMITIES: Bilateral  Lower extremity edema left more than the right. no clubbing and no cyanosis  NEURO: alert & oriented x 3 with fluent speech, no focal motor/sensory deficits, gait normal   PERFORMANCE STATUS: ECOG 2 - Symptomatic, <50% confined to bed      LABORATORY/RADIOLOGY DATA:   Recent Labs Lab 08/17/13 1340 08/18/13 0837  WBC 2.7* 2.4*  HGB 13.1 11.5*  HCT 36.2* 33.4*  PLT 49* 79*  MCV 99.7 102.8*  MCH 36.1* 35.4*  MCHC 36.2* 34.4  RDW 14.1 14.2  LYMPHSABS 0.3* 0.7  MONOABS 0.2 0.2  EOSABS 0.0 0.0  BASOSABS 0.0 0.0    CMP    Recent Labs Lab 08/17/13 1340 08/17/13 2108 08/18/13 0837  NA 133*  --  139  K 3.6*  --  3.2*  CL 87*  --  92*  CO2 24  --  32  GLUCOSE 168*  --  102*  BUN 23  --  20  CREATININE 0.70  --  0.80  CALCIUM 8.8  --  8.5  MG  --  1.5  --   AST 93*  --   --   ALT 28  --   --   ALKPHOS 64  --   --   BILITOT 1.8*  --   --  Component Value Date/Time   BILITOT 1.8* 08/17/2013 1340    Anemia panel:    Recent Labs  08/17/13 2108 08/18/13 0837  VITAMINB12 232  --   FOLATE 3.0*  --   RETICCTPCT  --  0.3*     Recent Labs  08/17/13 2108  TSH 1.650      Liver Function Tests:  Recent Labs Lab 08/17/13 1340  AST 93*  ALT 28  ALKPHOS 64  BILITOT 1.8*  PROT 6.8  ALBUMIN 3.4*    D-Dimer  Recent Labs  08/17/13 2108  DDIMER 1.52*    Thyroid function studies  Recent Labs  08/17/13 2108  TSH 1.650    Radiology Studies:  Dg Chest 2 View  08/17/2013   CLINICAL DATA:  Shortness of breath. Dizziness. Chest pain. Lower extremity swelling.  EXAM: CHEST  2 VIEW  COMPARISON:  None.  FINDINGS: The heart size and mediastinal contours are within normal limits. Both lungs are clear. The visualized  skeletal structures are unremarkable.  IMPRESSION: No active cardiopulmonary disease.   Electronically Signed   By: Earle Gell M.D.   On: 08/17/2013 15:02       ASSESSMENT AND PLAN:  1. Pancytopenia most likely secondary alcohol induced bone marrow suppression compounded by NSAID use versus secondary R/o splenomegaly: Recommend Ultrasound of the abdomen to assess for hepatosplenomegaly  2. F/u with stool occult ; GI evaluation to be done today to rule out GIB.   3. He can be started on anticoagulation for AFIB, once cleared by GI.  Platelets now adequate at 79 k after 2 units of platelets.  Of note, patient is receiving Amiodarone and Heparin IV  per Pharmacy as of today. Continue to monitor closely his counts due to increased risk of bleeding.   5.  Ultrasound of neck to assess for left supra clavicular lymphadenopathy odered, possibly to be performed today.   6. F/u with bilateral  venous doppler studies ordered to R/o DVT, results pending. CT angio has been ordered due to D Dimer elevation.  7. Counseled on stop using alcohol and to avoid NSAID's   8. Monitor daily CBC and DIff   Thank you so much for allowing me to participate in the care of Thomas [name]. I will continue to follow up the patient with you and assist in his care.    **Disclaimer: This note was dictated with voice recognition software. Similar sounding words can inadvertently be transcribed and this note may contain transcription errors which may not have been corrected upon publication of note.Rondel Jumbo, PA-C 08/18/2013, 9:53 AM

## 2013-08-18 NOTE — Progress Notes (Signed)
MD made aware of Pt BP and told to hold night time 12.5mg  Metoprolol and continue to monitor. Call light in reach.   Thomas [name]

## 2013-08-18 NOTE — Progress Notes (Signed)
MD paged about pt BP of 88/62.  MD order to hold Magnesium Sulfate until after morning rounds.  Call light in reach, RN will continue to monitor.   Nolon Nations, RN

## 2013-08-18 NOTE — Progress Notes (Signed)
Pt given 150mg  Amnio bolus then rate set at 33.65ml/hr.  Before Bolus pt BP 100/66 with heart rate of 119, Rhythm A. Fib.  After Amnio bolus pt BP 88/62; HR 116.  Pt resting in bed, with no c/o of pain at this time.  Call light in reach, RN will conintue to monitor.    Nolon Nations, RN

## 2013-08-18 NOTE — Consult Note (Addendum)
[facility] Consult: 10:57 AM 08/18/2013  LOS: 1 day    Referring Provider: Fransico Him MD Primary Care Physician:  No PCP Per Patient Primary Gastroenterologist:  unassigned    Reason for Consultation:  FOBT + stool, pancytopenia, ETOH abuse.    HPI: Thomas [name] is an obese 63 y.o. male. Admitted 4/30 with dizziness, wobbly legs x 2 days. 2 weeks painless, accelerating LLE edema (baseline level of chronic LE swelling) and 2 weeks of dark, tarry stools and anorexia. Found to be in Afib with RVR. + D dimer. Hemocults not yet performed.    Studies reveal hypokalemia, huponatremia, AST of 93, t bili of 1.8.  Has been seen by hematology for pancytopenia. Platelets have been transfused. Neck ultrasound ordered to evaluate left supra clavicular lymphadenopathy.  However Dr Johnsie Kindred feels "Pancytopenia most likely secondary alcohol induced bonemarrow suppression compounded by NSAID use".   Doppler studies and 2d echo pending.   GI wise: drinks moon shine (90 to 95% proof) consuming about one gallon with friends over a weeks time.  Dark, loose stools once per day along with anorexia for 2 weeks.  No nausea.  + weight loss. Taking 1-2 Aleve per day but stopped taking about 3 days ago.  Some intermittent dysphagia, mostly to solids.   REVIEW OF SYSTEMS: Pain in legs, right hip is chronic.  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 No cough, some DOE, no PND.  No nose bleeds, no derm bleeding.   No syncope, blackouts, memory issues.  No falls.  No eyesight issues, no dental pain of trouble chewing. No sores or rash.  Urinates about 3 times per night, no off-colored or bloody urine.  Family hx negative for colon cancer, though a sister died of some sort of  cancer (perhaps a gyn type) in her 24s.       Past Medical History  Diagnosis Date  . Obesity   . Arthritis     History reviewed. No pertinent past surgical history.  Prior to Admission medications   Medication Sig Start Date End Date Taking? Authorizing Provider  naproxen sodium (ANAPROX) 220 MG tablet Take 220 mg by mouth every 8 (eight) hours as needed (pain).   Yes Historical Provider, MD    Scheduled Meds: . amiodarone  150 mg Intravenous Once  . cyanocobalamin  1,000 mcg Intramuscular Once  . folic acid  5 mg Oral Daily  . furosemide  40 mg Intravenous Daily  . magnesium sulfate 1 - 4 g bolus IVPB  2 g Intravenous Once  . metoprolol tartrate  12.5 mg Oral Q12H  . multivitamin with minerals  1 tablet Oral Daily  . pantoprazole  40 mg Oral Q0600  . potassium chloride  40 mEq Oral Once  . thiamine  100 mg Oral Daily   Or  . thiamine  100 mg Intravenous Daily   Infusions: . amiodarone    . diltiazem (CARDIZEM) infusion Stopped (08/17/13 1553)   PRN Meds: LORazepam, LORazepam  Allergies as of 08/17/2013  . (No Known Allergies)    Family History  Problem Relation Age of Onset  . Hypertension Father         Family hx negative for colon cancer, though a sister died of some sort of cancer (perhaps a gyn type) in her 87s.   History   Social History  . Marital Status: Single    Spouse Name: N/A    Number of Children: N/A  . Years of Education: N/A   Occupational History  . 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  Disabled as of 2006   Social History Main Topics  . Smoking status: Never Smoker   . Smokeless tobacco: Never Used  . Alcohol Use: 18.0 oz/week    30 Cans of beer per week     Comment: daily  . Drug Use: No       Social History Narrative  . Lives with his healthy 71 y/o mother.      PHYSICAL EXAM: Vital signs in last 24 hours: Filed Vitals:   08/18/13 0541  BP: 88/62  Pulse: 116  Temp: 99.4 F (37.4 C)  Resp: 18   Wt  Readings from Last 3 Encounters:  08/18/13 131.135 kg (289 lb 1.6 oz)  07/12/13 142.883 kg (315 lb)  06/21/13 142.883 kg (315 lb)   General: obese, unwell appearing AAM,  Comfortable, NAD Head:  No swelling, no asymmetry  Eyes:  No icterus, slight icterus.  EOMI.  No pallor Ears:  Slightly HOH  Nose:  No congestion or discharge Mouth:  Fair dental health, some missing teeth.  Some gum disease and build up of tartar/plaque.  No widespread dental caries.  Neck:  No mass, no TMG. Lungs:  Clear bil.  No cough or labored breathing Heart: Irreg Irreg.  No MRG Abdomen:  Soft, obese, no mass/bruits/HSM.   Rectal: scant bit of brown stool, no gross blood but FOBT is rapidly/pervasively positive.    Musc/Skeltl: no joint contractures Extremities:  Left > right LE edema  Neurologic:  No confusion, no tremors, no confusion.  Oriented x 3.  No asterixis.  Skin:  No rash, sores.  Some bruises and old scars on legs where he had bumped legs.  Dense onychomycosis on toenails Tattoos:  none Nodes:  No cervical adenopathy.    Psych:  Pleasant, relaxed, cooperative.   Intake/Output from previous day: 04/30 0701 - 05/01 0700 In: 420 [Blood:420] Out: 1175 [Urine:1175] Intake/Output this shift:    LAB RESULTS:  Recent Labs  08/17/13 1340 08/18/13 0837  WBC 2.7* 2.4*  HGB 13.1 11.5*  HCT 36.2* 33.4*  PLT 49* 79*   BMET Lab Results  Component Value Date   NA 139 08/18/2013   NA 133* 08/17/2013   K 3.2* 08/18/2013   K 3.6* 08/17/2013   CL 92* 08/18/2013   CL 87* 08/17/2013   CO2 32 08/18/2013   CO2 24 08/17/2013   GLUCOSE 102* 08/18/2013   GLUCOSE 168* 08/17/2013   BUN 20 08/18/2013   BUN 23 08/17/2013   CREATININE 0.80 08/18/2013   CREATININE 0.70 08/17/2013   CALCIUM 8.5 08/18/2013   CALCIUM 8.8 08/17/2013   LFT  Recent Labs  08/17/13 1340  PROT 6.8  ALBUMIN 3.4*  AST 93*  ALT 28  ALKPHOS 64  BILITOT 1.8*   PT/INR No results found for this basename: INR, PROTIME   Hepatitis Panel No  results found for this basename: HEPBSAG, HCVAB, HEPAIGM, HEPBIGM,  in  the last 72 hours  RADIOLOGY STUDIES: Dg Chest 2 View 08/17/2013   FINDINGS: The heart size and mediastinal contours are within normal limits. Both lungs are clear. The visualized skeletal structures are unremarkable.  IMPRESSION: No active cardiopulmonary disease.   Electronically Signed   By: Earle Gell M.D.   On: 08/17/2013 15:02    ENDOSCOPIC STUDIES: none   IMPRESSION:   *  Pancytopenia:  Leukopenia >> thrombocytopenia>> than anemia.  Platelets improved after 2 units platelets.   *  FOBT + stool.  Dark, tarry stools and anorexia for 2 weeks. Rule out ETOH/NSAID gastritis or ulcers.  Rule out portal htn/esophageal varices.   *  Afib with RVR, improved with Amiodarone.  Starting Heparin drip, likely to need long term AC.   *  Elevated D dimer, LE dopper studies ordered.   *  ETOH abuse. Slight increase of AST, c/w ETOH abuse.      PLAN:     *  Needs colonoscopy/EGDbut ? Is timing of these given heart rate and work up of ?DVT/PE.  *  Watch for acceleration of GI bleeding on Heparin gtt.  *  Add Protonix po once daily as you have done.   *  Pending tests for today include 2D echo, LE dopplers, CTA chest, neck ultrasound. *  Ok to feed from GI standpoint.  *  CMET, CBC in AM.  *  Ok to send one FOBT card to lab, so + status is documented in Epic, but more than one is not necessary.    Vena Rua  08/18/2013, 10:57 AM Pager: [phone]    [facility] Attending  I have also seen and assessed the patient and agree with the above note. He has alcoholic liver disease - I bet he has cirrhosis. He says he will quit drinking. Pancytopenia mostly from liver dz/EtyOh/portal htn I bet. Given anemia and heme + stool and need for A/C Tx EGD and colonoscopy will be appropriate when he is improved but before he starts oral anti-coag tx.  The risks and benefits as well as alternatives of endoscopic procedure(s)  have been discussed and reviewed. All questions answered. The patient agrees to proceed.  Will check a lead level given moonshine consumption.  Dr. Collene Mares will check him this weekend.  Gatha Mayer, MD, [facility] [phone] (pager) 08/18/2013 4:15 PM

## 2013-08-18 NOTE — Progress Notes (Signed)
    Subjective:  No complaints, calm, cooperative  Objective:  Vital Signs in the last 24 hours: Temp:  [97.8 F (36.6 C)-99.4 F (37.4 C)] 99.4 F (37.4 C) (05/01 0541) Pulse Rate:  [29-120] 116 (05/01 0541) Resp:  [10-22] 18 (05/01 0541) BP: (87-136)/(44-89) 88/62 mmHg (05/01 0541) SpO2:  [92 %-100 %] 96 % (05/01 0541) Weight:  [289 lb 1.6 oz (131.135 kg)-330 lb (149.687 kg)] 289 lb 1.6 oz (131.135 kg) (05/01 0500)  Intake/Output from previous day:  Intake/Output Summary (Last 24 hours) at 08/18/13 1111 Last data filed at 08/18/13 0527  Gross per 24 hour  Intake    420 ml  Output   1175 ml  Net   -755 ml    Physical Exam: General appearance: alert, cooperative and no distress Lungs: clear to auscultation bilaterally Heart: irregularly irregular rhythm   Rate: 100  Rhythm: atrial fibrillation  Lab Results:  Recent Labs  08/17/13 1340 08/18/13 0837  WBC 2.7* 2.4*  HGB 13.1 11.5*  PLT 49* 79*    Recent Labs  08/17/13 1340 08/18/13 0837  NA 133* 139  K 3.6* 3.2*  CL 87* 92*  CO2 24 32  GLUCOSE 168* 102*  BUN 23 20  CREATININE 0.70 0.80   No results found for this basename: TROPONINI, CK, MB,  in the last 72 hours No results found for this basename: INR,  in the last 72 hours  Imaging: Imaging results have been reviewed  Cardiac Studies:  Assessment/Plan:   Principal Problem:   Atrial fibrillation with RVR Active Problems:   SOB (shortness of breath)   Pancytopenia   ETOH abuse   Positive D dimer   Hypokalemia   Edema of left lower extremity   Elevated LFTs    PLAN: Replace K+, start IV Heparin, f/u LFTs in am. D dimer + so he will need CTA of chest, LE venous dopplers cancelled ? wil re order.  Echo ordered for today. I have contacted GI.  Add PPI.   Kerin Ransom PA-C [facility] [phone] 08/18/2013, 11:11 AM

## 2013-08-18 NOTE — Consult Note (Signed)
  Amiodarone Drug - Drug Interaction Consult Note  Recommendations: 63 y/o male with history of ETOH abuse admitted for complaints of dizziness, unsteadiness, LLE edema, and dark stools.  He was found to have atrial fibrillation w/RVR.  Amiodarone has been started to control his HR. Current potential drug interactions noted below.  Amiodarone is metabolized by the cytochrome P450 system and therefore has the potential to cause many drug interactions. Amiodarone has an average plasma half-life of 50 days (range 20 to 100 days).   There is potential for drug interactions to occur several weeks or months after stopping treatment and the onset of drug interactions may be slow after initiating amiodarone.   []  Statins: Increased risk of myopathy. Simvastatin- restrict dose to 20mg  daily. Other statins: counsel patients to report any muscle pain or weakness immediately.  []  Anticoagulants: Amiodarone can increase anticoagulant effect. Consider warfarin dose reduction. Patients should be monitored closely and the dose of anticoagulant altered accordingly, remembering that amiodarone levels take several weeks to stabilize.  []  Antiepileptics: Amiodarone can increase plasma concentration of phenytoin, the dose should be reduced. Note that small changes in phenytoin dose can result in large changes in levels. Monitor patient and counsel on signs of toxicity.  [x]  Beta blockers: increased risk of bradycardia, AV block and myocardial depression. Sotalol - avoid concomitant use.  [x]   Calcium channel blockers (diltiazem and verapamil): increased risk of bradycardia, AV block and myocardial depression.  []   Cyclosporine: Amiodarone increases levels of cyclosporine. Reduced dose of cyclosporine is recommended.  []  Digoxin dose should be halved when amiodarone is started.  [x]  Diuretics: increased risk of cardiotoxicity if hypokalemia occurs.  []  Oral hypoglycemic agents (glyburide, glipizide,  glimepiride): increased risk of hypoglycemia. Patient's glucose levels should be monitored closely when initiating amiodarone therapy.   []  Drugs that prolong the QT interval:  Torsades de pointes risk may be increased with concurrent use - avoid if possible.  Monitor QTc, also keep magnesium/potassium WNL if concurrent therapy can't be avoided. Marland Kitchen Antibiotics: e.g. fluoroquinolones, erythromycin. . Antiarrhythmics: e.g. quinidine, procainamide, disopyramide, sotalol. . Antipsychotics: e.g. phenothiazines, haloperidol.  . Lithium, tricyclic antidepressants, and methadone. Thank You,  Estelle June , Pharm. D. 08/18/2013 8:58 AM

## 2013-08-18 NOTE — Progress Notes (Signed)
Pt resting in bed, had ambulated to bathroom with minimal assist.  Prior to ambulation pt BP 80/66; and 78/60 manually.  Pt asymptomatic, currently washing up in chair.  MD paged, waiting on call back.  Call light in reach, RN will continue to monitor.   Nolon Nations, RN

## 2013-08-18 NOTE — Progress Notes (Signed)
  Echocardiogram 2D Echocardiogram has been performed.  Thomas [name] 08/18/2013, 5:46 PM

## 2013-08-18 NOTE — Progress Notes (Signed)
RN notified pt in [place] with heart rate as high as 233.  Pt resting in bed asymptomatic.  VS stable BP 105/68; HR 11; 98% on RA.  EKG performed.  MD paged and made aware pt had 48 beats of VTach.  MD going to update orders.  Call light in reach, RN will continue to monitor.   Nolon Nations, RN

## 2013-08-18 NOTE — Progress Notes (Addendum)
*  PRELIMINARY RESULTS* Vascular Ultrasound Left lower extremity venous duplex has been completed.  Preliminary findings: Left:  No evidence of DVT, superficial thrombosis, or Baker's cyst.  Left groin area: enlarged lymph node noted.    [place] 08/18/2013, 6:13 PM

## 2013-08-18 NOTE — Consult Note (Signed)
PHARMACY CONSULT NOTE  Pharmacy Consult :  Heparin Indication : atrial fibrillation w/RVR   Allergies: No Known Allergies  Dosing weight : 110 kg  Vital Signs: BP 88/62  Pulse 116  Temp(Src) 99.4 F (37.4 C) (Oral)  Resp 18  Ht 6\' 1"  (1.854 m)  Wt 289 lb 1.6 oz (131.135 kg)  BMI 38.15 kg/m2  SpO2 96%  Active Problems: Principal Problem:   Atrial fibrillation with RVR Active Problems:   SOB (shortness of breath)   Acute diastolic congestive heart failure   Pancytopenia   ETOH abuse   Hypokalemia   Edema of left lower extremity   Elevated LFTs   Positive D dimer   Labs:  Recent Labs  08/17/13 1340 08/18/13 0837  HGB 13.1 11.5*  HCT 36.2* 33.4*  PLT 49* 79*  CREATININE 0.70 0.80   Estimated Creatinine Clearance: 139.4 ml/min (by C-G formula based on Cr of 0.8).  Medical / Surgical History: Past Medical History  Diagnosis Date  . Obesity   . Arthritis    History reviewed. No pertinent past surgical history.  Current Medication[s] Include: Medication PTA: Prescriptions prior to admission  Medication Sig Dispense Refill  . naproxen sodium (ANAPROX) 220 MG tablet Take 220 mg by mouth every 8 (eight) hours as needed (pain).        Scheduled:  Scheduled:  . amiodarone  150 mg Intravenous Once  . cyanocobalamin  1,000 mcg Intramuscular Once  . folic acid  5 mg Oral Daily  . furosemide  40 mg Intravenous Daily  . magnesium sulfate 1 - 4 g bolus IVPB  2 g Intravenous Once  . metoprolol tartrate  12.5 mg Oral Q12H  . multivitamin with minerals  1 tablet Oral Daily  . pantoprazole  40 mg Oral Q0600  . potassium chloride  40 mEq Oral Once  . thiamine  100 mg Oral Daily   Or  . thiamine  100 mg Intravenous Daily   Infusion[s]: Infusions:  . amiodarone    . diltiazem (CARDIZEM) infusion Stopped (08/17/13 1553)   Antibiotic[s]: Anti-infectives   None     Assessment:  62 y.o.male with history of ETOH, pancytopenia with Plts < 100 k, Acute  diastolic CHF, and atrial fibrillation w/RVR.  Hematology consulted regarding use of anticoagulants with low platelets.  Cleared to start Heparin when Platelets > 60,000.  Cardiology ordered Heparin to be started per pharmacy consult with Platelets up to 79,000  Goal of Therapy:  Heparin goal is 0.3-0.7 units/ml.      Plan:   Heparin bolus 4000 units IV now, then  Start Heparin infusion at 1500 units/hr  Next Heparin level, CBC in 6 hours, 1730 pm  Daily heparin level and CBC.  Monitor for bleeding complications. Follow Platelet counts.  Porschia Willbanks, Craig Guess,  Pharm.D.. 08/18/2013,  10:53 AM

## 2013-08-19 DIAGNOSIS — I428 Other cardiomyopathies: Secondary | ICD-10-CM

## 2013-08-19 DIAGNOSIS — I5022 Chronic systolic (congestive) heart failure: Secondary | ICD-10-CM | POA: Diagnosis present

## 2013-08-19 DIAGNOSIS — I420 Dilated cardiomyopathy: Secondary | ICD-10-CM | POA: Diagnosis present

## 2013-08-19 DIAGNOSIS — R7989 Other specified abnormal findings of blood chemistry: Secondary | ICD-10-CM

## 2013-08-19 DIAGNOSIS — I5021 Acute systolic (congestive) heart failure: Secondary | ICD-10-CM

## 2013-08-19 LAB — COMPREHENSIVE METABOLIC PANEL
ALT: 34 U/L (ref 0–53)
AST: 168 U/L — ABNORMAL HIGH (ref 0–37)
Albumin: 3.2 g/dL — ABNORMAL LOW (ref 3.5–5.2)
Alkaline Phosphatase: 59 U/L (ref 39–117)
BUN: 18 mg/dL (ref 6–23)
CO2: 28 mEq/L (ref 19–32)
Calcium: 8.6 mg/dL (ref 8.4–10.5)
Chloride: 91 mEq/L — ABNORMAL LOW (ref 96–112)
Creatinine, Ser: 0.84 mg/dL (ref 0.50–1.35)
GFR calc Af Amer: >90 mL/min (ref >90)
GFR calc non Af Amer: >90 mL/min (ref >90)
Glucose, Bld: 100 mg/dL — ABNORMAL HIGH (ref 70–99)
Potassium: 3.5 mEq/L — ABNORMAL LOW (ref 3.7–5.3)
Sodium: 134 mEq/L — ABNORMAL LOW (ref 137–147)
Total Bilirubin: 1.1 mg/dL (ref 0.3–1.2)
Total Protein: 6.2 g/dL (ref 6.0–8.3)

## 2013-08-19 LAB — PREPARE PLATELET PHERESIS
UNIT DIVISION: 0
UNIT DIVISION: 0

## 2013-08-19 LAB — RETICULOCYTES
RBC.: 3.17 MIL/uL — ABNORMAL LOW (ref 4.22–5.81)
RETIC CT PCT: 0.4 % (ref 0.4–3.1)
Retic Count, Absolute: 12.7 10*3/uL — ABNORMAL LOW (ref 19.0–186.0)

## 2013-08-19 LAB — CBC WITH DIFFERENTIAL/PLATELET
BASOS ABS: 0 10*3/uL (ref 0.0–0.1)
BASOS PCT: 1 % (ref 0–1)
Eosinophils Absolute: 0 10*3/uL (ref 0.0–0.7)
Eosinophils Relative: 1 % (ref 0–5)
HCT: 32.4 % — ABNORMAL LOW (ref 39.0–52.0)
Hemoglobin: 11.3 g/dL — ABNORMAL LOW (ref 13.0–17.0)
Lymphocytes Relative: 40 % (ref 12–46)
Lymphs Abs: 1.2 10*3/uL (ref 0.7–4.0)
MCH: 35.3 pg — ABNORMAL HIGH (ref 26.0–34.0)
MCHC: 34.9 g/dL (ref 30.0–36.0)
MCV: 101.3 fL — ABNORMAL HIGH (ref 78.0–100.0)
Monocytes Absolute: 0.3 10*3/uL (ref 0.1–1.0)
Monocytes Relative: 11 % (ref 3–12)
NEUTROS PCT: 48 % (ref 43–77)
Neutro Abs: 1.4 10*3/uL — ABNORMAL LOW (ref 1.7–7.7)
Platelets: 82 10*3/uL — ABNORMAL LOW (ref 150–400)
RBC: 3.2 MIL/uL — ABNORMAL LOW (ref 4.22–5.81)
RDW: 14.2 % (ref 11.5–15.5)
WBC: 3 10*3/uL — ABNORMAL LOW (ref 4.0–10.5)

## 2013-08-19 LAB — HEPARIN LEVEL (UNFRACTIONATED): HEPARIN UNFRACTIONATED: 0.57 [IU]/mL (ref 0.30–0.70)

## 2013-08-19 LAB — MAGNESIUM: Magnesium: 1.6 mg/dL (ref 1.5–2.5)

## 2013-08-19 LAB — GLUCOSE, CAPILLARY: Glucose-Capillary: 100 mg/dL — ABNORMAL HIGH (ref 70–99)

## 2013-08-19 MED ORDER — DIGOXIN 0.25 MG/ML IJ SOLN
0.2500 mg | Freq: Four times a day (QID) | INTRAMUSCULAR | Status: AC
  Administered 2013-08-19 (×2): 0.25 mg via INTRAVENOUS
  Filled 2013-08-19 (×3): qty 1

## 2013-08-19 MED ORDER — DIGOXIN 125 MCG PO TABS
0.1250 mg | ORAL_TABLET | Freq: Every day | ORAL | Status: DC
  Administered 2013-08-20 – 2013-08-24 (×5): 0.125 mg via ORAL
  Filled 2013-08-19 (×5): qty 1

## 2013-08-19 MED ORDER — POTASSIUM CHLORIDE CRYS ER 20 MEQ PO TBCR
40.0000 meq | EXTENDED_RELEASE_TABLET | Freq: Every day | ORAL | Status: DC
  Administered 2013-08-19: 40 meq via ORAL
  Filled 2013-08-19 (×2): qty 2

## 2013-08-19 NOTE — Consult Note (Signed)
PHARMACY CONSULT NOTE  Pharmacy Consult :  Heparin Indication : atrial fibrillation w/RVR   Allergies: No Known Allergies  Dosing weight : 110 kg  Vital Signs: BP 111/72  Pulse 115  Temp(Src) 98.3 F (36.8 C) (Oral)  Resp 20  Ht 6\' 1"  (1.854 m)  Wt 288 lb 11.2 oz (130.953 kg)  BMI 38.10 kg/m2  SpO2 97%  Active Problems: Principal Problem:   Atrial fibrillation with RVR Active Problems:   SOB (shortness of breath)   Pancytopenia   ETOH abuse   Hypokalemia   Edema of left lower extremity   Elevated LFTs   Positive D dimer   Alcoholic liver disease   Heme + stool   Labs:  Recent Labs  08/17/13 1340 08/18/13 0837  HGB 13.1 11.5*  HCT 36.2* 33.4*  PLT 49* 79*  CREATININE 0.70 0.80   Estimated Creatinine Clearance: 132.7 ml/min (by C-G formula based on Cr of 0.84).  Medical / Surgical History: Past Medical History  Diagnosis Date  . Obesity   . Arthritis    History reviewed. No pertinent past surgical history.  Current Medication[s] Include: Medication PTA: Prescriptions prior to admission  Medication Sig Dispense Refill  . naproxen sodium (ANAPROX) 220 MG tablet Take 220 mg by mouth every 8 (eight) hours as needed (pain).        Scheduled:  Scheduled:  . folic acid  5 mg Oral Daily  . furosemide  40 mg Intravenous Daily  . magnesium sulfate 1 - 4 g bolus IVPB  2 g Intravenous Once  . metoprolol tartrate  12.5 mg Oral Q12H  . multivitamin with minerals  1 tablet Oral Daily  . pantoprazole  40 mg Oral Q0600  . thiamine  100 mg Oral Daily   Or  . thiamine  100 mg Intravenous Daily   Infusion[s]: Infusions:  . amiodarone 30 mg/hr (08/19/13 0006)  . diltiazem (CARDIZEM) infusion Stopped (08/17/13 1553)  . heparin 1,500 Units/hr (08/19/13 0150)   Antibiotic[s]: Anti-infectives   None     Assessment:  63 y.o.male with history of ETOH, pancytopenia with Plts < 604 k, Acute diastolic CHF, and atrial fibrillation w/RVR. Heparin is therapeutic  this AM.   Goal of Therapy:  Heparin goal is 0.3-0.7 units/ml.      Plan:   Cont heparin at 1500 units/hr Daily HL

## 2013-08-19 NOTE — Progress Notes (Addendum)
Cross cover [facility] Subjective: Patient admitted with guaiac positive stools and pancytopenia thought to be from bone marrow supression. Also has atrial fibrillation with RVR improved on Amiodarone. He denies having any abdominal pain, nausea, vomiting, diarrhea or constipation.  Objective: Vital signs in last 24 hours: Temp:  [98.3 F (36.8 C)-98.7 F (37.1 C)] 98.3 F (36.8 C) (05/02 0420) Pulse Rate:  [76-115] 115 (05/02 0420) Resp:  [18-20] 20 (05/02 0420) BP: (78-111)/(60-72) 111/72 mmHg (05/02 0420) SpO2:  [97 %-100 %] 97 % (05/02 0420) Weight:  [130.953 kg (288 lb 11.2 oz)] 130.953 kg (288 lb 11.2 oz) (05/02 0420) Last BM Date: 08/18/13  Intake/Output from previous day: 05/01 0701 - 05/02 0700 In: 120 [P.O.:120] Out: 100 [Urine:100] Intake/Output this shift: Total I/O In: 240 [P.O.:240] Out: 400 [Urine:400]  General appearance: alert, cooperative, appears stated age and morbidly obese Resp: clear to auscultation bilaterally Cardio: irregularly irregular rhythm GI: soft, non-tender; bowel sounds normal; no masses,  no organomegaly Extremities: extremities atraumatic, no cyanosis but 3+ edema noted with exfoliating skin over both lower limbs   Lab Results:  Recent Labs  08/18/13 0837 08/18/13 1852 08/19/13 0420  WBC 2.4* 3.0* 3.0*  HGB 11.5* 12.5* 11.3*  HCT 33.4* 35.6* 32.4*  PLT 79* 88* 82*   BMET  Recent Labs  08/17/13 1340 08/18/13 0837 08/19/13 0420  NA 133* 139 134*  K 3.6* 3.2* 3.5*  CL 87* 92* 91*  CO2 24 32 28  GLUCOSE 168* 102* 100*  BUN 23 20 18   CREATININE 0.70 0.80 0.84  CALCIUM 8.8 8.5 8.6   LFT  Recent Labs  08/19/13 0420  PROT 6.2  ALBUMIN 3.2*  AST 168*  ALT 34  ALKPHOS 59  BILITOT 1.1   Studies/Results: Dg Chest 2 View  08/17/2013   CLINICAL DATA:  Shortness of breath. Dizziness. Chest pain. Lower extremity swelling.  EXAM: CHEST  2 VIEW  COMPARISON:  None.  FINDINGS: The heart size and mediastinal contours are within  normal limits. Both lungs are clear. The visualized skeletal structures are unremarkable.  IMPRESSION: No active cardiopulmonary disease.   Electronically Signed   By: Earle Gell M.D.   On: 08/17/2013 15:02   Ct Angio Chest Pe W/cm &/or Wo Cm  08/18/2013   CLINICAL DATA:  Positive D-dimer. Admitted with dizziness and left lower extremity edema.  EXAM: CT ANGIOGRAPHY CHEST WITH CONTRAST  TECHNIQUE: Multidetector CT imaging of the chest was performed using the standard protocol during bolus administration of intravenous contrast. Multiplanar CT image reconstructions and MIPs were obtained to evaluate the vascular anatomy.  CONTRAST:  50mL OMNIPAQUE IOHEXOL 350 MG/ML SOLN  COMPARISON:  Chest radiograph 08/17/2013  FINDINGS: There is no evidence for a large or central pulmonary embolism. The study has technical limitations due to motion artifact and poor opacification of the distal branches. Coronary arteries are heavily calcified. No significant pericardial or pleural fluid. The left hepatic lobe is diffusely low density. The right hepatic lobe has poor definition of the internal architecture. There appears to be recanalization of the umbilical vein. Findings are concerning for geographic hepatic steatosis and possibly cirrhosis. Normal appearance of the adrenal glands. No significant chest lymphadenopathy. The gallbladder is distended and contains high density fluid. Findings are suggestive for sludge.  Trachea and mainstem bronchi are patent. There are patchy parenchymal densities in the lower lungs, left side greater than right. Small amount of pleural-based consolidation in the medial right lower lobe. Findings are concerning for areas of airspace disease  and infection. No significant pleural effusions. No acute bone abnormality.  Review of the MIP images confirms the above findings.  IMPRESSION: The study has technical limitations due to motion artifact. There is no evidence for a large or central pulmonary  embolism.  Patchy airspace densities in the lower lobes, left side greater than right. Findings are concerning for pneumonia or aspiration.  Abnormal appearance of the liver. Findings may represent a combination of geographic hepatic steatosis and cirrhosis.  High-density material in the gallbladder. Findings could represent sludge.   Electronically Signed   By: Markus Daft M.D.   On: 08/18/2013 16:01   Medications: I have reviewed the patient's current medications.  Assessment/Plan: *1) Pancytopenia with guaiac positive stools: An EGD/Colonoscopy is planned for Monday.  2) ETOH [moonshine] abuse; alcoholic liver disease; abnormal LFT's.  3) Atrial fibrillation with RVR-improving. 4) Bilateral lower extremity swelling. 5) Dilated cardiomypathy.     LOS: 2 days   Juanita Craver 08/19/2013, 12:39 PM

## 2013-08-19 NOTE — Progress Notes (Addendum)
SUBJECTIVE:  No complaints  OBJECTIVE:   Vitals:   Filed Vitals:   08/18/13 0541 08/18/13 2043 08/18/13 2054 08/19/13 0420  BP: 88/62 80/66 78/60  111/72  Pulse: 116 76  115  Temp: 99.4 F (37.4 C) 98.7 F (37.1 C)  98.3 F (36.8 C)  TempSrc: Oral Oral  Oral  Resp: 18 18  20   Height:      Weight:    288 lb 11.2 oz (130.953 kg)  SpO2: 96% 100%  97%   I&O's:   Intake/Output Summary (Last 24 hours) at 08/19/13 0757 Last data filed at 08/19/13 0300  Gross per 24 hour  Intake    120 ml  Output    100 ml  Net     20 ml   TELEMETRY: Reviewed telemetry pt in atrial fibrillation with RVR:     PHYSICAL EXAM General: Well developed, well nourished, in no acute distress Head: Eyes PERRLA, No xanthomas.   Normal cephalic and atramatic  Lungs:   Clear bilaterally to auscultation and percussion. Heart:   Irregularly irregular and tachy S1 S2 Pulses are 2+ & equal. Abdomen: Bowel sounds are positive, abdomen soft and non-tender without masses  Extremities:   No clubbing, cyanosis or edema.  DP +1 Neuro: Alert and oriented X 3. Psych:  Good affect, responds appropriately   LABS: Basic Metabolic Panel:  Recent Labs  08/17/13 2108 08/18/13 0837 08/19/13 0420  NA  --  139 134*  K  --  3.2* 3.5*  CL  --  92* 91*  CO2  --  32 28  GLUCOSE  --  102* 100*  BUN  --  20 18  CREATININE  --  0.80 0.84  CALCIUM  --  8.5 8.6  MG 1.5  --  1.6   Liver Function Tests:  Recent Labs  08/17/13 1340 08/19/13 0420  AST 93* 168*  ALT 28 34  ALKPHOS 64 59  BILITOT 1.8* 1.1  PROT 6.8 6.2  ALBUMIN 3.4* 3.2*   No results found for this basename: LIPASE, AMYLASE,  in the last 72 hours CBC:  Recent Labs  08/18/13 0837 08/18/13 1852 08/19/13 0420  WBC 2.4* 3.0* 3.0*  NEUTROABS 1.5*  --  1.4*  HGB 11.5* 12.5* 11.3*  HCT 33.4* 35.6* 32.4*  MCV 102.8* 101.4* 101.3*  PLT 79* 88* 82*   Cardiac Enzymes: No results found for this basename: CKTOTAL, CKMB, CKMBINDEX, TROPONINI,  in  the last 72 hours BNP: No components found with this basename: POCBNP,  D-Dimer:  Recent Labs  08/17/13 2108  DDIMER 1.52*   Hemoglobin A1C: No results found for this basename: HGBA1C,  in the last 72 hours Fasting Lipid Panel: No results found for this basename: CHOL, HDL, LDLCALC, TRIG, CHOLHDL, LDLDIRECT,  in the last 72 hours Thyroid Function Tests:  Recent Labs  08/17/13 2108  TSH 1.650   Anemia Panel:  Recent Labs  08/17/13 2108  08/19/13 0420  VITAMINB12 232  --   --   FOLATE 3.0*  --   --   RETICCTPCT  --   < > 0.4  < > = values in this interval not displayed. Coag Panel:   No results found for this basename: INR, PROTIME    RADIOLOGY: Dg Chest 2 View  08/17/2013   CLINICAL DATA:  Shortness of breath. Dizziness. Chest pain. Lower extremity swelling.  EXAM: CHEST  2 VIEW  COMPARISON:  None.  FINDINGS: The heart size and mediastinal contours are within normal limits.  Both lungs are clear. The visualized skeletal structures are unremarkable.  IMPRESSION: No active cardiopulmonary disease.   Electronically Signed   By: Earle Gell M.D.   On: 08/17/2013 15:02   Ct Angio Chest Pe W/cm &/or Wo Cm  08/18/2013   CLINICAL DATA:  Positive D-dimer. Admitted with dizziness and left lower extremity edema.  EXAM: CT ANGIOGRAPHY CHEST WITH CONTRAST  TECHNIQUE: Multidetector CT imaging of the chest was performed using the standard protocol during bolus administration of intravenous contrast. Multiplanar CT image reconstructions and MIPs were obtained to evaluate the vascular anatomy.  CONTRAST:  54mL OMNIPAQUE IOHEXOL 350 MG/ML SOLN  COMPARISON:  Chest radiograph 08/17/2013  FINDINGS: There is no evidence for a large or central pulmonary embolism. The study has technical limitations due to motion artifact and poor opacification of the distal branches. Coronary arteries are heavily calcified. No significant pericardial or pleural fluid. The left hepatic lobe is diffusely low density. The  right hepatic lobe has poor definition of the internal architecture. There appears to be recanalization of the umbilical vein. Findings are concerning for geographic hepatic steatosis and possibly cirrhosis. Normal appearance of the adrenal glands. No significant chest lymphadenopathy. The gallbladder is distended and contains high density fluid. Findings are suggestive for sludge.  Trachea and mainstem bronchi are patent. There are patchy parenchymal densities in the lower lungs, left side greater than right. Small amount of pleural-based consolidation in the medial right lower lobe. Findings are concerning for areas of airspace disease and infection. No significant pleural effusions. No acute bone abnormality.  Review of the MIP images confirms the above findings.  IMPRESSION: The study has technical limitations due to motion artifact. There is no evidence for a large or central pulmonary embolism.  Patchy airspace densities in the lower lobes, left side greater than right. Findings are concerning for pneumonia or aspiration.  Abnormal appearance of the liver. Findings may represent a combination of geographic hepatic steatosis and cirrhosis.  High-density material in the gallbladder. Findings could represent sludge.   Electronically Signed   By: Markus Daft M.D.   On: 08/18/2013 16:01   Assessment/Plan:  Principal Problem:  Atrial fibrillation with RVR on IV Heparin gtt  Active Problems:  SOB (shortness of breath) secondary to acute systolic CHF Pancytopenia - improved and most likely secondary to bone marrow suppression from ETOH - Heme following  ETOH abuse  Positive D dimer - no PE on chest CT Hypokalemia  Edema of left lower extremity - no DVT by venous dopplers but large left groin lymph node Elevated AST c/s ETOH abuse per GI Moderate LV dysfunction EF 30% with diffuse HK Moderate MR Dark Stools with anorexia for 2 weeks ? Secondary to ETOH/NSAID gastritis/ulcers - appreciate GI input Acute  systolic CHF with moderate LV dysfunction EF 30% probably from [facility] abuse +/- tachycardia induced CM  PLAN: 1.  Continue Protonix 2.  Continue IV Heparin gtt - will transition to NOAC once all GI/Heme workup complete 3.  Colonoscopy/EGD in near future per GI 4.  Replete potassium 5.  Will need Lexiscan myoview once elevated  HR and CHF resolved  to rule out ischemia given LV dysfunction 6.  Continue IV Amio gtt 7.  D/C Metoprolol due to hypotension 8.  Start Digoxin for rate control 9.  Kdur 78meq daily 10.  Check BMET in am 11.  Unable to add low dose ACE I at present due to low BP    Sueanne Margarita, MD  08/19/2013  7:57 AM

## 2013-08-20 LAB — GLUCOSE, CAPILLARY: GLUCOSE-CAPILLARY: 88 mg/dL (ref 70–99)

## 2013-08-20 LAB — CBC WITH DIFFERENTIAL/PLATELET
BASOS PCT: 1 % (ref 0–1)
Basophils Absolute: 0 10*3/uL (ref 0.0–0.1)
Eosinophils Absolute: 0.1 10*3/uL (ref 0.0–0.7)
Eosinophils Relative: 2 % (ref 0–5)
HCT: 29 % — ABNORMAL LOW (ref 39.0–52.0)
HEMOGLOBIN: 10.2 g/dL — AB (ref 13.0–17.0)
LYMPHS ABS: 1 10*3/uL (ref 0.7–4.0)
Lymphocytes Relative: 35 % (ref 12–46)
MCH: 35.4 pg — AB (ref 26.0–34.0)
MCHC: 35.2 g/dL (ref 30.0–36.0)
MCV: 100.7 fL — ABNORMAL HIGH (ref 78.0–100.0)
MONO ABS: 0.6 10*3/uL (ref 0.1–1.0)
Monocytes Relative: 20 % — ABNORMAL HIGH (ref 3–12)
Neutro Abs: 1.2 10*3/uL — ABNORMAL LOW (ref 1.7–7.7)
Neutrophils Relative %: 43 % (ref 43–77)
Platelets: 60 10*3/uL — ABNORMAL LOW (ref 150–400)
RBC: 2.88 MIL/uL — ABNORMAL LOW (ref 4.22–5.81)
RDW: 14.2 % (ref 11.5–15.5)
WBC: 2.9 10*3/uL — ABNORMAL LOW (ref 4.0–10.5)

## 2013-08-20 LAB — BASIC METABOLIC PANEL
BUN: 12 mg/dL (ref 6–23)
CHLORIDE: 93 meq/L — AB (ref 96–112)
CO2: 30 mEq/L (ref 19–32)
Calcium: 8.1 mg/dL — ABNORMAL LOW (ref 8.4–10.5)
Creatinine, Ser: 0.84 mg/dL (ref 0.50–1.35)
GFR calc Af Amer: >90 mL/min (ref >90)
GFR calc non Af Amer: >90 mL/min (ref >90)
Glucose, Bld: 89 mg/dL (ref 70–99)
Potassium: 3.3 mEq/L — ABNORMAL LOW (ref 3.7–5.3)
Sodium: 135 mEq/L — ABNORMAL LOW (ref 137–147)

## 2013-08-20 LAB — RETICULOCYTES
RBC.: 2.89 MIL/uL — ABNORMAL LOW (ref 4.22–5.81)
Retic Count, Absolute: 17.3 10*3/uL — ABNORMAL LOW (ref 19.0–186.0)
Retic Ct Pct: 0.6 % (ref 0.4–3.1)

## 2013-08-20 LAB — MAGNESIUM: MAGNESIUM: 1.5 mg/dL (ref 1.5–2.5)

## 2013-08-20 LAB — HEPARIN LEVEL (UNFRACTIONATED): Heparin Unfractionated: 0.48 IU/mL (ref 0.30–0.70)

## 2013-08-20 MED ORDER — PEG 3350-KCL-NA BICARB-NACL 420 G PO SOLR
4000.0000 mL | Freq: Once | ORAL | Status: AC
  Administered 2013-08-20: 4000 mL via ORAL
  Filled 2013-08-20: qty 4000

## 2013-08-20 MED ORDER — POTASSIUM CHLORIDE CRYS ER 20 MEQ PO TBCR
40.0000 meq | EXTENDED_RELEASE_TABLET | Freq: Two times a day (BID) | ORAL | Status: DC
  Administered 2013-08-20 (×2): 40 meq via ORAL
  Filled 2013-08-20 (×3): qty 2

## 2013-08-20 NOTE — Progress Notes (Signed)
ANTICOAGULATION CONSULT NOTE - Follow Up Consult  Pharmacy Consult for Heparin Indication: atrial fibrillation  No Known Allergies  Patient Measurements: Height: 6\' 1"  (185.4 cm) Weight: 288 lb 11.2 oz (130.953 kg) IBW/kg (Calculated) : 79.9 Heparin Dosing Weight: 110 kg  Vital Signs: Temp: 98.7 F (37.1 C) (05/03 0600) Temp src: Oral (05/03 0600) BP: 112/75 mmHg (05/03 0939) Pulse Rate: 93 (05/03 0939)  Labs:  Recent Labs  08/18/13 0837 08/18/13 1852 08/19/13 0420 08/20/13 0506  HGB 11.5* 12.5* 11.3* 10.2*  HCT 33.4* 35.6* 32.4* 29.0*  PLT 79* 88* 82* 60*  HEPARINUNFRC  --  0.72* 0.57 0.48  CREATININE 0.80  --  0.84 0.84    Estimated Creatinine Clearance: 132.7 ml/min (by C-G formula based on Cr of 0.84).   Assessment: 68 YOM with history of ETOH, pancytopenia with Plts < 124 k, Acute diastolic CHF, and atrial fibrillation w/RVR, on IV heparin, heparin level (0.48) is therapeutic this AM on 1500 units/hr. Hgb 11.3 > 10.2, plt 82 > 60K, no bleeding noted per RN.    Goal of Therapy:  Heparin level 0.3-0.7 units/ml Monitor platelets by anticoagulation protocol: Yes   Plan:  - Continue heparin infusion 1500 units/hr - Monitor s/sx of bleeding closely since pltc is trending down to 60k - f/u plan for oral anticoagulation.  Maryanna Shape, PharmD, BCPS  Clinical Pharmacist  Pager: [phone]   08/20/2013,11:12 AM

## 2013-08-20 NOTE — Progress Notes (Signed)
Cross cover [facility] Subjective: Since I last evaluated the patient, he seems to be doing somewhat better. On a heparin drip for atrial fibrillation. Aawiting workup for anemia and guaiac positive stools.  Objective: Vital signs in last 24 hours: Temp:  [97.6 F (36.4 C)-98.7 F (37.1 C)] 98.7 F (37.1 C) (05/03 0600) Pulse Rate:  [81-100] 93 (05/03 0939) Resp:  [18-21] 18 (05/03 0600) BP: (100-112)/(68-78) 112/75 mmHg (05/03 0939) SpO2:  [95 %-99 %] 95 % (05/03 0600) Weight:  [130.953 kg (288 lb 11.2 oz)] 130.953 kg (288 lb 11.2 oz) (05/03 0500) Last BM Date: 08/18/13  Intake/Output from previous day: 05/02 0701 - 05/03 0700 In: 1220.4 [P.O.:840; I.V.:380.4] Out: 3050 [Urine:3050] Intake/Output this shift:   General appearance: alert, cooperative, appears stated age, no distress and morbidly obese Resp: clear to auscultation bilaterally Cardio: irregularly irregular rhythm GI: soft, non-tender; bowel sounds normal; no masses,  no organomegaly Extremities: there is significant edema of the lower limbs with venous stasis Lab Results:  Recent Labs  08/18/13 1852 08/19/13 0420 08/20/13 0506  WBC 3.0* 3.0* 2.9*  HGB 12.5* 11.3* 10.2*  HCT 35.6* 32.4* 29.0*  PLT 88* 82* 60*   BMET  Recent Labs  08/18/13 0837 08/19/13 0420 08/20/13 0506  NA 139 134* 135*  K 3.2* 3.5* 3.3*  CL 92* 91* 93*  CO2 32 28 30  GLUCOSE 102* 100* 89  BUN 20 18 12   CREATININE 0.80 0.84 0.84  CALCIUM 8.5 8.6 8.1*   LFT  Recent Labs  08/19/13 0420  PROT 6.2  ALBUMIN 3.2*  AST 168*  ALT 34  ALKPHOS 59  BILITOT 1.1   Studies/Results: Ct Angio Chest Pe W/cm &/or Wo Cm  08/18/2013   CLINICAL DATA:  Positive D-dimer. Admitted with dizziness and left lower extremity edema.  EXAM: CT ANGIOGRAPHY CHEST WITH CONTRAST  TECHNIQUE: Multidetector CT imaging of the chest was performed using the standard protocol during bolus administration of intravenous contrast. Multiplanar CT image  reconstructions and MIPs were obtained to evaluate the vascular anatomy.  CONTRAST:  83mL OMNIPAQUE IOHEXOL 350 MG/ML SOLN  COMPARISON:  Chest radiograph 08/17/2013  FINDINGS: There is no evidence for a large or central pulmonary embolism. The study has technical limitations due to motion artifact and poor opacification of the distal branches. Coronary arteries are heavily calcified. No significant pericardial or pleural fluid. The left hepatic lobe is diffusely low density. The right hepatic lobe has poor definition of the internal architecture. There appears to be recanalization of the umbilical vein. Findings are concerning for geographic hepatic steatosis and possibly cirrhosis. Normal appearance of the adrenal glands. No significant chest lymphadenopathy. The gallbladder is distended and contains high density fluid. Findings are suggestive for sludge.  Trachea and mainstem bronchi are patent. There are patchy parenchymal densities in the lower lungs, left side greater than right. Small amount of pleural-based consolidation in the medial right lower lobe. Findings are concerning for areas of airspace disease and infection. No significant pleural effusions. No acute bone abnormality.  Review of the MIP images confirms the above findings.  IMPRESSION: The study has technical limitations due to motion artifact. There is no evidence for a large or central pulmonary embolism.  Patchy airspace densities in the lower lobes, left side greater than right. Findings are concerning for pneumonia or aspiration.  Abnormal appearance of the liver. Findings may represent a combination of geographic hepatic steatosis and cirrhosis.  High-density material in the gallbladder. Findings could represent sludge.   Electronically Signed  By: Markus Daft M.D.   On: 08/18/2013 16:01   Medications: I have reviewed the patient's current medications.  Assessment/Plan: 1) Pancytopenia [anemia] with guaiac positive stools: will schedule  an EGD/Colonoscopy for tomorrow. His Heparin will have to be held 4 hours prior to the procedure.  2) ETOH abuse with elevated AST/fatty liver. 3) Sludge in the gallbladder on CT.  4) Atrial fibrillation on Heparin drip.  5) Dilated cardiomyopathy.  6) Acute systolic CHF.  7) Positive D dimer.  LOS: 3 days   Juanita Craver 08/20/2013, 10:23 AM

## 2013-08-20 NOTE — Progress Notes (Addendum)
SUBJECTIVE:  No complaints except for LE edema  OBJECTIVE:   Vitals:   Filed Vitals:   08/19/13 1400 08/19/13 2053 08/20/13 0500 08/20/13 0600  BP: 100/77 112/78  104/68  Pulse: 81 100  91  Temp: 97.6 F (36.4 C) 98.2 F (36.8 C)  98.7 F (37.1 C)  TempSrc: Oral Oral  Oral  Resp: 18 21  18   Height:      Weight:   288 lb 11.2 oz (130.953 kg)   SpO2: 99% 99%  95%   I&O's:   Intake/Output Summary (Last 24 hours) at 08/20/13 1194 Last data filed at 08/20/13 0600  Gross per 24 hour  Intake  980.4 ml  Output   3050 ml  Net -2069.6 ml   TELEMETRY: Reviewed telemetry pt in atrial fibrillation at 106bpm  PHYSICAL EXAM General: Well developed, well nourished, in no acute distress Head: Eyes PERRLA, No xanthomas.   Normal cephalic and atramatic  Lungs:   Clear bilaterally to auscultation and percussion. Heart:   Irregularly irregular and tachy S1 S2 Pulses are 2+ & equal. Abdomen: Bowel sounds are positive, abdomen soft and non-tender without masses Extremities:  2+ edema Neuro: Alert and oriented X 3. Psych:  Good affect, responds appropriately   LABS: Basic Metabolic Panel:  Recent Labs  08/17/13 2108  08/19/13 0420 08/20/13 0506  NA  --   < > 134* 135*  K  --   < > 3.5* 3.3*  CL  --   < > 91* 93*  CO2  --   < > 28 30  GLUCOSE  --   < > 100* 89  BUN  --   < > 18 12  CREATININE  --   < > 0.84 0.84  CALCIUM  --   < > 8.6 8.1*  MG 1.5  --  1.6  --   < > = values in this interval not displayed. Liver Function Tests:  Recent Labs  08/17/13 1340 08/19/13 0420  AST 93* 168*  ALT 28 34  ALKPHOS 64 59  BILITOT 1.8* 1.1  PROT 6.8 6.2  ALBUMIN 3.4* 3.2*   No results found for this basename: LIPASE, AMYLASE,  in the last 72 hours CBC:  Recent Labs  08/19/13 0420 08/20/13 0506  WBC 3.0* 2.9*  NEUTROABS 1.4* 1.2*  HGB 11.3* 10.2*  HCT 32.4* 29.0*  MCV 101.3* 100.7*  PLT 82* 60*   Cardiac Enzymes: No results found for this basename: CKTOTAL, CKMB,  CKMBINDEX, TROPONINI,  in the last 72 hours BNP: No components found with this basename: POCBNP,  D-Dimer:  Recent Labs  08/17/13 2108  DDIMER 1.52*   Hemoglobin A1C: No results found for this basename: HGBA1C,  in the last 72 hours Fasting Lipid Panel: No results found for this basename: CHOL, HDL, LDLCALC, TRIG, CHOLHDL, LDLDIRECT,  in the last 72 hours Thyroid Function Tests:  Recent Labs  08/17/13 2108  TSH 1.650   Anemia Panel:  Recent Labs  08/17/13 2108  08/20/13 0506  VITAMINB12 232  --   --   FOLATE 3.0*  --   --   RETICCTPCT  --   < > 0.6  < > = values in this interval not displayed. Coag Panel:   No results found for this basename: INR, PROTIME    RADIOLOGY: Dg Chest 2 View  08/17/2013   CLINICAL DATA:  Shortness of breath. Dizziness. Chest pain. Lower extremity swelling.  EXAM: CHEST  2 VIEW  COMPARISON:  None.  FINDINGS: The heart size and mediastinal contours are within normal limits. Both lungs are clear. The visualized skeletal structures are unremarkable.  IMPRESSION: No active cardiopulmonary disease.   Electronically Signed   By: Earle Gell M.D.   On: 08/17/2013 15:02   Ct Angio Chest Pe W/cm &/or Wo Cm  08/18/2013   CLINICAL DATA:  Positive D-dimer. Admitted with dizziness and left lower extremity edema.  EXAM: CT ANGIOGRAPHY CHEST WITH CONTRAST  TECHNIQUE: Multidetector CT imaging of the chest was performed using the standard protocol during bolus administration of intravenous contrast. Multiplanar CT image reconstructions and MIPs were obtained to evaluate the vascular anatomy.  CONTRAST:  74mL OMNIPAQUE IOHEXOL 350 MG/ML SOLN  COMPARISON:  Chest radiograph 08/17/2013  FINDINGS: There is no evidence for a large or central pulmonary embolism. The study has technical limitations due to motion artifact and poor opacification of the distal branches. Coronary arteries are heavily calcified. No significant pericardial or pleural fluid. The left hepatic lobe is  diffusely low density. The right hepatic lobe has poor definition of the internal architecture. There appears to be recanalization of the umbilical vein. Findings are concerning for geographic hepatic steatosis and possibly cirrhosis. Normal appearance of the adrenal glands. No significant chest lymphadenopathy. The gallbladder is distended and contains high density fluid. Findings are suggestive for sludge.  Trachea and mainstem bronchi are patent. There are patchy parenchymal densities in the lower lungs, left side greater than right. Small amount of pleural-based consolidation in the medial right lower lobe. Findings are concerning for areas of airspace disease and infection. No significant pleural effusions. No acute bone abnormality.  Review of the MIP images confirms the above findings.  IMPRESSION: The study has technical limitations due to motion artifact. There is no evidence for a large or central pulmonary embolism.  Patchy airspace densities in the lower lobes, left side greater than right. Findings are concerning for pneumonia or aspiration.  Abnormal appearance of the liver. Findings may represent a combination of geographic hepatic steatosis and cirrhosis.  High-density material in the gallbladder. Findings could represent sludge.   Electronically Signed   By: Markus Daft M.D.   On: 08/18/2013 16:01   Assessment/Plan:  Principal Problem:  Atrial fibrillation with RVR on IV Heparin gtt - HR improved after adding dig Active Problems:  SOB (shortness of breath) secondary to acute systolic CHF - improved Pancytopenia - stable and most likely secondary to bone marrow suppression from ETOH - Heme following  ETOH abuse  Positive D dimer - no PE on chest CT  Hypokalemia  Edema of left lower extremity - no DVT by venous dopplers but large left groin lymph node  Elevated AST c/s ETOH abuse per GI  Moderate LV dysfunction EF 30% with diffuse HK  Moderate MR  Dark Stools with anorexia for 2 weeks ?  Secondary to ETOH/NSAID gastritis/ulcers - appreciate GI input  Acute systolic CHF with moderate LV dysfunction EF 30% probably from ETOH abuse +/- tachycardia induced CM .  He is net 2.5L neg   PLAN:  1. Continue Protonix  2. Continue IV Heparin gtt - will transition to NOAC once all GI/Heme workup complete  3. Colonoscopy/EGD in near future per GI  4. Replete potassium  5. Will need Lexiscan myoview once elevated HR and CHF resolved to rule out ischemia given LV dysfunction  6. Continue IV Amio gtt - will give an extra bolus of 150mg  IV to try to get HR below 100 7. Beta blocker on  hold due to hypotension  8. Contniue Digoxin, IV Cardizem gtt and Amio gtt for rate control  9. Replete potassium 10. Check BMET in am  11. Unable to add low dose ACE I at present due to low BP 12.  Continue IV Lasix - he still has significant LE edema     Sueanne Margarita, MD  08/20/2013  9:06 AM

## 2013-08-21 ENCOUNTER — Encounter (HOSPITAL_COMMUNITY): Payer: Self-pay | Admitting: Internal Medicine

## 2013-08-21 ENCOUNTER — Inpatient Hospital Stay (HOSPITAL_COMMUNITY): Payer: 59

## 2013-08-21 ENCOUNTER — Inpatient Hospital Stay (HOSPITAL_COMMUNITY): Payer: 59 | Admitting: Anesthesiology

## 2013-08-21 ENCOUNTER — Encounter (HOSPITAL_COMMUNITY)
Admission: EM | Disposition: A | Discharge status: Home / Self Care | Payer: Self-pay | Source: Home / Self Care | Attending: Cardiology

## 2013-08-21 ENCOUNTER — Encounter (HOSPITAL_COMMUNITY): Payer: 59 | Admitting: Anesthesiology

## 2013-08-21 ENCOUNTER — Encounter: Payer: Self-pay | Admitting: Internal Medicine

## 2013-08-21 DIAGNOSIS — K3189 Other diseases of stomach and duodenum: Secondary | ICD-10-CM

## 2013-08-21 DIAGNOSIS — Z860100 Personal history of colon polyps, unspecified: Secondary | ICD-10-CM

## 2013-08-21 DIAGNOSIS — K766 Portal hypertension: Secondary | ICD-10-CM

## 2013-08-21 DIAGNOSIS — R599 Enlarged lymph nodes, unspecified: Secondary | ICD-10-CM

## 2013-08-21 DIAGNOSIS — K2990 Gastroduodenitis, unspecified, without bleeding: Secondary | ICD-10-CM

## 2013-08-21 DIAGNOSIS — K2970 Gastritis, unspecified, without bleeding: Secondary | ICD-10-CM

## 2013-08-21 DIAGNOSIS — K319 Disease of stomach and duodenum, unspecified: Secondary | ICD-10-CM

## 2013-08-21 DIAGNOSIS — D126 Benign neoplasm of colon, unspecified: Secondary | ICD-10-CM

## 2013-08-21 HISTORY — PX: COLONOSCOPY: SHX5424

## 2013-08-21 HISTORY — DX: Personal history of colonic polyps: Z86.010

## 2013-08-21 HISTORY — PX: ESOPHAGOGASTRODUODENOSCOPY: SHX5428

## 2013-08-21 HISTORY — DX: Personal history of colon polyps, unspecified: Z86.0100

## 2013-08-21 LAB — BASIC METABOLIC PANEL
BUN: 10 mg/dL (ref 6–23)
CALCIUM: 8.1 mg/dL — AB (ref 8.4–10.5)
CO2: 28 mEq/L (ref 19–32)
CREATININE: 0.96 mg/dL (ref 0.50–1.35)
Chloride: 95 mEq/L — ABNORMAL LOW (ref 96–112)
GFR calc Af Amer: >90 mL/min (ref >90)
GFR calc non Af Amer: 88 mL/min — ABNORMAL LOW (ref >90)
Glucose, Bld: 91 mg/dL (ref 70–99)
Potassium: 3.4 mEq/L — ABNORMAL LOW (ref 3.7–5.3)
Sodium: 135 mEq/L — ABNORMAL LOW (ref 137–147)

## 2013-08-21 LAB — CBC WITH DIFFERENTIAL/PLATELET
BASOS ABS: 0 10*3/uL (ref 0.0–0.1)
Basophils Relative: 1 % (ref 0–1)
EOS ABS: 0 10*3/uL (ref 0.0–0.7)
Eosinophils Relative: 1 % (ref 0–5)
HCT: 28.5 % — ABNORMAL LOW (ref 39.0–52.0)
Hemoglobin: 10 g/dL — ABNORMAL LOW (ref 13.0–17.0)
LYMPHS PCT: 25 % (ref 12–46)
Lymphs Abs: 0.9 10*3/uL (ref 0.7–4.0)
MCH: 35.6 pg — ABNORMAL HIGH (ref 26.0–34.0)
MCHC: 35.1 g/dL (ref 30.0–36.0)
MCV: 101.4 fL — ABNORMAL HIGH (ref 78.0–100.0)
MONO ABS: 1 10*3/uL (ref 0.1–1.0)
Monocytes Relative: 28 % — ABNORMAL HIGH (ref 3–12)
NEUTROS PCT: 45 % (ref 43–77)
Neutro Abs: 1.6 10*3/uL — ABNORMAL LOW (ref 1.7–7.7)
PLATELETS: 66 10*3/uL — AB (ref 150–400)
RBC: 2.81 MIL/uL — ABNORMAL LOW (ref 4.22–5.81)
RDW: 14.4 % (ref 11.5–15.5)
WBC: 3.5 10*3/uL — ABNORMAL LOW (ref 4.0–10.5)

## 2013-08-21 LAB — HEPARIN LEVEL (UNFRACTIONATED): Heparin Unfractionated: 0.58 IU/mL (ref 0.30–0.70)

## 2013-08-21 LAB — RETICULOCYTES
RBC.: 2.81 MIL/uL — ABNORMAL LOW (ref 4.22–5.81)
RETIC CT PCT: 1.2 % (ref 0.4–3.1)
Retic Count, Absolute: 33.7 10*3/uL (ref 19.0–186.0)

## 2013-08-21 LAB — GLUCOSE, CAPILLARY
GLUCOSE-CAPILLARY: 101 mg/dL — AB (ref 70–99)
Glucose-Capillary: 85 mg/dL (ref 70–99)

## 2013-08-21 LAB — ANA: Anti Nuclear Antibody(ANA): NEGATIVE

## 2013-08-21 SURGERY — EGD (ESOPHAGOGASTRODUODENOSCOPY)
Anesthesia: Monitor Anesthesia Care

## 2013-08-21 MED ORDER — IOHEXOL 300 MG/ML  SOLN
100.0000 mL | Freq: Once | INTRAMUSCULAR | Status: AC | PRN
  Administered 2013-08-21: 100 mL via INTRAVENOUS

## 2013-08-21 MED ORDER — PROMETHAZINE HCL 25 MG/ML IJ SOLN
6.2500 mg | INTRAMUSCULAR | Status: DC | PRN
Start: 2013-08-21 — End: 2013-08-24

## 2013-08-21 MED ORDER — POTASSIUM CHLORIDE CRYS ER 20 MEQ PO TBCR
40.0000 meq | EXTENDED_RELEASE_TABLET | Freq: Three times a day (TID) | ORAL | Status: DC
  Administered 2013-08-21 – 2013-08-24 (×9): 40 meq via ORAL
  Filled 2013-08-21 (×10): qty 2

## 2013-08-21 MED ORDER — SODIUM CHLORIDE 0.9 % IV SOLN
INTRAVENOUS | Status: DC
  Administered 2013-08-21: 18:00:00 via INTRAVENOUS

## 2013-08-21 MED ORDER — LACTATED RINGERS IV SOLN
INTRAVENOUS | Status: DC | PRN
  Administered 2013-08-21: 12:00:00 via INTRAVENOUS

## 2013-08-21 MED ORDER — OXYCODONE HCL 5 MG PO TABS: 5.0000 mg | ORAL_TABLET | Freq: Once | ORAL | Status: AC | PRN

## 2013-08-21 MED ORDER — OXYCODONE HCL 5 MG/5ML PO SOLN: 5.0000 mg | Freq: Once | ORAL | Status: AC | PRN

## 2013-08-21 MED ORDER — LACTATED RINGERS IV SOLN
INTRAVENOUS | Status: DC
  Administered 2013-08-21: 1000 mL via INTRAVENOUS

## 2013-08-21 MED ORDER — PROPOFOL 10 MG/ML IV BOLUS
INTRAVENOUS | Status: DC | PRN
  Administered 2013-08-21 (×2): 50 mg via INTRAVENOUS
  Administered 2013-08-21: 20 mg via INTRAVENOUS
  Administered 2013-08-21 (×3): 50 mg via INTRAVENOUS
  Administered 2013-08-21: 20 mg via INTRAVENOUS

## 2013-08-21 MED ORDER — HYDROMORPHONE HCL PF 1 MG/ML IJ SOLN: 0.2500 mg | INTRAMUSCULAR | Status: DC | PRN

## 2013-08-21 MED ORDER — SODIUM CHLORIDE 0.9 % IV SOLN: INTRAVENOUS | Status: DC

## 2013-08-21 NOTE — Anesthesia Postprocedure Evaluation (Signed)
Anesthesia Post Note  Patient: Thomas [name]  Procedure(s) Performed: Procedure(s) (LRB): ESOPHAGOGASTRODUODENOSCOPY (EGD) (N/A) COLONOSCOPY (N/A)  Anesthesia type: MAC  Patient location: PACU  Post pain: Pain level controlled  Post assessment: Patient's Cardiovascular Status Stable  Last Vitals:  Filed Vitals:   08/21/13 1445  BP: 117/83  Pulse: 81  Temp: 36.4 C  Resp: 20    Post vital signs: Reviewed and stable  Level of consciousness: sedated  Complications: No apparent anesthesia complications

## 2013-08-21 NOTE — Op Note (Signed)
[facility] [place], [address]   ENDOSCOPY PROCEDURE REPORT  PATIENT: Thomas [name], Thomas [name]  MR#: [id number] BIRTHDATE: 03/10/1951 , 62  yrs. old GENDER: Male ENDOSCOPIST: Jerene Bears, MD REFERRED BY:  [facility] PROCEDURE DATE:  08/21/2013 PROCEDURE:  EGD, diagnostic ASA CLASS:     Class III INDICATIONS:  Anemia.   abnormal CT of the GI tract.   Heme positive stool. MEDICATIONS: MAC sedation, administered by CRNA and See Anesthesia Report. TOPICAL ANESTHETIC: none  DESCRIPTION OF PROCEDURE: After the risks benefits and alternatives of the procedure were thoroughly explained, informed consent was obtained.  The PENTAX GASTOROSCOPE [id number] endoscope was introduced through the mouth and advanced to the second portion of the duodenum. Without limitations.  The instrument was slowly withdrawn as the mucosa was fully examined.     ESOPHAGUS: The mucosa of the esophagus appeared normal.   Regular Z-line.  No esophageal varices seen.  STOMACH: Moderate portal hypertensive gastropathy was found in the gastric cardia, fundus and proximal gastric body.   Moderate acute gastritis (inflammation) with 2 small superficial ulcers was found in the gastric antrum.  There were erosions present.  Query alcohol induced gastritis.  A small hiatal hernia was noted.  DUODENUM: Mild duodenal inflammation was found in the duodenal bulb. Normal examined portion of D2  Retroflexed views revealed a hiatal hernia.     The scope was then withdrawn from the patient and the procedure completed.  COMPLICATIONS: There were no complications.  ENDOSCOPIC IMPRESSION: 1.   The mucosa of the esophagus appeared normal. No esophageal varices 2.   Portal hypertensive gastropathy was found in the gastric fundus and gastric body 3.   Acute gastritis (inflammation) was found in the gastric antrum 4.   Small hiatal hernia 5.   Duodenal inflammation was found in the  duodenal bulb    RECOMMENDATIONS: 1.  Daily PPI 2.  Check H pylori serology and treat if positive 3.  Alcohol cessation 4.  Outpatient GI follow-up  eSigned:  Jerene Bears, MD 08/21/2013 1:11 PM   CC:The Patient  PATIENT NAME:  Thomas [name], Thomas [name] MR#: [id number]

## 2013-08-21 NOTE — Anesthesia Procedure Notes (Signed)
Procedure Name: MAC Date/Time: 08/21/2013 12:00 PM Performed by: Neldon Newport Pre-anesthesia Checklist: Emergency Drugs available, Suction available, Timeout performed, Patient being monitored and Patient identified Patient Re-evaluated:Patient Re-evaluated prior to inductionPlacement Confirmation: positive ETCO2

## 2013-08-21 NOTE — Anesthesia Preprocedure Evaluation (Addendum)
Anesthesia Evaluation  Patient identified by MRN, date of birth, ID band Patient awake    Reviewed: Allergy & Precautions, H&P , NPO status , Patient's Chart, lab work & pertinent test results  Airway Mallampati: II TM Distance: >3 FB Neck ROM: full    Dental  (+) Teeth Intact, Dental Advidsory Given, Poor Dentition   Pulmonary neg pulmonary ROS, shortness of breath and with exertion,    Pulmonary exam normal       Cardiovascular +CHF Rhythm:Irregular     Neuro/Psych negative neurological ROS  negative psych ROS   GI/Hepatic (+)     substance abuse  alcohol use,   Endo/Other  Morbid obesity  Renal/GU      Musculoskeletal   Abdominal   Peds  Hematology  (+) anemia ,   Anesthesia Other Findings   Reproductive/Obstetrics                         Anesthesia Physical Anesthesia Plan  ASA: III  Anesthesia Plan: MAC   Post-op Pain Management:    Induction:   Airway Management Planned:   Additional Equipment:   Intra-op Plan:   Post-operative Plan:   Informed Consent: I have reviewed the patients History and Physical, chart, labs and discussed the procedure including the risks, benefits and alternatives for the proposed anesthesia with the patient or authorized representative who has indicated his/her understanding and acceptance.   Dental Advisory Given  Plan Discussed with: CRNA, Anesthesiologist and Surgeon  Anesthesia Plan Comments:       Anesthesia Quick Evaluation

## 2013-08-21 NOTE — Progress Notes (Addendum)
Patient seen, examined, and I agree with the above documentation, including the assessment and plan. Plan EGD and colonoscopy today, heparin stopped 6 hours before procedure The nature of the procedures, as well as the risks, benefits, and alternatives were carefully and thoroughly reviewed with the patient. Ample time for discussion and questions allowed. The patient understood, was satisfied, and agreed to proceed.

## 2013-08-21 NOTE — Progress Notes (Signed)
Subjective: No SOB, CP.  Legs feel weak when he walks  Objective: Vital signs in last 24 hours: Temp:  [98.2 F (36.8 C)-98.6 F (37 C)] 98.6 F (37 C) (05/04 0449) Pulse Rate:  [86-100] 86 (05/04 0449) Resp:  [16-19] 19 (05/04 0449) BP: (99-116)/(56-77) 114/77 mmHg (05/04 0449) SpO2:  [99 %-100 %] 99 % (05/04 0449) Weight:  [289 lb (131.09 kg)] 289 lb (131.09 kg) (05/04 0449) Last BM Date: 08/18/13  Intake/Output from previous day: 05/03 0701 - 05/04 0700 In: 240 [P.O.:240] Out: 1850 [Urine:1850] Intake/Output this shift:    Medications Current Facility-Administered Medications  Medication Dose Route Frequency Provider Last Rate Last Dose  . amiodarone IV infusion  30 mg/hr Intravenous Continuous Alwyn Pea, MD 16.7 mL/hr at 08/20/13 2009 30 mg/hr at 08/20/13 2009  . digoxin (LANOXIN) tablet 0.125 mg  0.125 mg Oral Daily Sueanne Margarita, MD   0.125 mg at 08/20/13 0939  . diltiazem (CARDIZEM) 100 mg in dextrose 5 % 100 mL infusion  5-15 mg/hr Intravenous Titrated Charles B. Karle Starch, MD   10 mg/hr at 08/17/13 1509  . folic acid (FOLVITE) tablet 5 mg  5 mg Oral Daily Padma Kamineni, MD   5 mg at 08/20/13 0939  . furosemide (LASIX) injection 40 mg  40 mg Intravenous Daily Tarri Fuller, PA-C   40 mg at 08/20/13 0939  . magnesium sulfate IVPB 2 g 50 mL  2 g Intravenous Once Alwyn Pea, MD      . multivitamin with minerals tablet 1 tablet  1 tablet Oral Daily Tarri Fuller, PA-C   1 tablet at 08/20/13 [id number]  . pantoprazole (PROTONIX) EC tablet 40 mg  40 mg Oral Q0600 Erlene Quan, PA-C   40 mg at 08/21/13 2956  . potassium chloride SA (K-DUR,KLOR-CON) CR tablet 40 mEq  40 mEq Oral BID Sueanne Margarita, MD   40 mEq at 08/20/13 2237  . thiamine (VITAMIN B-1) tablet 100 mg  100 mg Oral Daily Tarri Fuller, PA-C   100 mg at 08/20/13 2130   Or  . thiamine (B-1) injection 100 mg  100 mg Intravenous Daily Tarri Fuller, PA-C        PE: General appearance: alert, cooperative and  no distress Lungs: clear to auscultation bilaterally Heart: irregularly irregular rhythm and No MM Abdomen: +BS nontender Extremities: 1 left LEE Pulses: 2+ and symmetric Skin: Warm and dry Neurologic: Grossly normal  Lab Results:   Recent Labs  08/19/13 0420 08/20/13 0506 08/21/13 0530  WBC 3.0* 2.9* 3.5*  HGB 11.3* 10.2* 10.0*  HCT 32.4* 29.0* 28.5*  PLT 82* 60* 66*   BMET  Recent Labs  08/19/13 0420 08/20/13 0506 08/21/13 0530  NA 134* 135* 135*  K 3.5* 3.3* 3.4*  CL 91* 93* 95*  CO2 28 30 28   GLUCOSE 100* 89 91  BUN 18 12 10   CREATININE 0.84 0.84 0.96  CALCIUM 8.6 8.1* 8.1*     Assessment/Plan   Principal Problem:   Atrial fibrillation with RVR  Still in afib.  Improved.  Rate ~110.   On digoxin, Amiodarone IV.  IV dilt held.  Active Problems:   SOB (shortness of breath)  Likely from Afib   Pancytopenia  Partially from ETOH use.  SP 2 units of platelets.  Hgb stable 10.0.  Hematology following.    Heme + stool  GI planning EGD/Colonoscopy. On protonix.     ETOH abuse  Elevated AST and evidence of cirrhosis on CT  Hypokalemia  Continue replacement.  Change to TID     Edema of left lower extremity  No DVT found.  Enlarged lymph node in groin.    Elevated LFTs  Continue to follow   Positive D dimer  No evidence of a large or central pulmonary embolism    Alcoholic liver disease      DCM (dilated cardiomyopathy) EF 30% with diffuse hypokinesis, moderate MR, LA moderately dilated.  Needs ischemic evaluation.    Acute systolic CHF (congestive heart failure)  Lasix 40mg  IV daily.  SCr stable.  Net fluids -1.6L/-4.2L.  Continue dose and reevaluate in the morning.    LOS: 4 days    Tarri Fuller PA-C 08/21/2013 8:48 AM  Attending Note:   The patient was seen and examined.  Agree with assessment and plan as noted above.  Changes made to the above note as needed.  Patient is going for colonscopy today.   Stable from a cardiac  standpoint  Ramond Dial., MD, [facility] 08/21/2013, 7:07 PM

## 2013-08-21 NOTE — Progress Notes (Signed)
ANTICOAGULATION CONSULT NOTE - Follow Up Consult  Pharmacy Consult for Heparin Indication: atrial fibrillation  No Known Allergies  Patient Measurements: Height: 6\' 1"  (185.4 cm) Weight: 289 lb (131.09 kg) IBW/kg (Calculated) : 79.9 Heparin Dosing Weight: 110 kg  Vital Signs: Temp: 97.5 F (36.4 C) (05/04 1445) Temp src: Oral (05/04 1445) BP: 117/83 mmHg (05/04 1445) Pulse Rate: 81 (05/04 1445)  Labs:  Recent Labs  08/19/13 0420 08/20/13 0506 08/21/13 0530  HGB 11.3* 10.2* 10.0*  HCT 32.4* 29.0* 28.5*  PLT 82* 60* 66*  HEPARINUNFRC 0.57 0.48 0.58  CREATININE 0.84 0.84 0.96    Estimated Creatinine Clearance: 116.2 ml/min (by C-G formula based on Cr of 0.96).   Assessment: 64 YOM with history of ETOH, pancytopenia with Plts < 482 k, acute diastolic CHF, and atrial fibrillation w/RVR. Pt was on therapeutic IV heparin (level 0.58) this a.m. Heparin d/c at 0800 for EGD/colonoscopy today. Noted pt also underwent polypectomy of 2 polyps 3-5 mm each today. Hgb down to 10. Plt 66 (low but stable). No bleeding noted per RN.   Goal of Therapy:  Heparin level 0.3-0.7 units/ml Monitor platelets by anticoagulation protocol: Yes   Plan:  - F/u restart heparin infusion tomorrow - doubt will restart today s/p polypectomy this afternoon. Will d/c a.m. heparin level as heparin likely to be restarted after 0500 tomorrow. - Monitor s/sx of bleeding closely since pltc low - F/u plan for oral anticoagulation start  Sherlon Handing, PharmD, BCPS Clinical pharmacist, pager [phone]  08/21/2013,2:48 PM

## 2013-08-21 NOTE — Op Note (Signed)
[facility] [place], [address]   COLONOSCOPY PROCEDURE REPORT  PATIENT: [name], [name]  MR#: [id number] BIRTHDATE: 1951/04/11 , 62  yrs. old GENDER: Male ENDOSCOPIST: Jerene Bears, MD REFERRED JO:[facility] Hospital[facility] PROCEDURE DATE:  08/21/2013 PROCEDURE:   Colonoscopy with cold biopsy polypectomy First Screening Colonoscopy - Avg.  risk and is 50 yrs.  old or older - No.  Prior Negative Screening - Now for repeat screening. N/A  History of Adenoma - Now for follow-up colonoscopy & has been > or = to 3 yrs.  N/A  Polyps Removed Today? Yes. ASA CLASS:   Class III INDICATIONS:Anemia, non-specific, heme-positive stool, and first colonoscopy. MEDICATIONS: MAC sedation, administered by CRNA and See Anesthesia Report.  DESCRIPTION OF PROCEDURE:   After the risks benefits and alternatives of the procedure were thoroughly explained, informed consent was obtained.  A digital rectal exam revealed no rectal mass.   The adult Pentax colonoscopy   was introduced through the anus and advanced to the IC valve and cecum, though the cecal base was incompletely visualized. No adverse events experienced.   The quality of the prep was fair, using Colyte and despite 2 tap water enemas.  Copious irrigation and lavage was performed.  The instrument was then slowly withdrawn as the colon was fully examined.   COLON FINDINGS: Despite counter-pressure and change in patient position the scope could not be completely seated in the cecal base.  Cold forceps were used to pull the cecum into view as much as possible. Two sessile polyps ranging between 3-56mm in size were found in the ascending colon and descending colon.  Polypectomy was performed with cold forceps.  All resections were complete and all polyp tissue was completely retrieved.   There was moderate diverticulosis noted in the sigmoid colon with associated muscular hypertrophy and petechiae.  No active  bleeding or evidence of recent bleeding was seen. Retroflexed views revealed no abnormalities.        The scope was withdrawn and the procedure completed.  COMPLICATIONS: There were no complications.   ENDOSCOPIC IMPRESSION: 1.  Two sessile polyps ranging between 3-13mm in size were found in the ascending colon and descending colon; Polypectomy was performed with cold forceps 2.  There was moderate diverticulosis noted in the sigmoid colon 3.   Incomplete visualization of cecal base  RECOMMENDATIONS: 1.  Await pathology results 2.  High fiber diet 3.  Follow-up in GI clinic after discharge.  See EGD report. Consider Cologuard as an outpatient given incomplete visualization of the cecum.   eSigned:  Jerene Bears, MD 08/21/2013 1:20 PM   cc: The Patient   PATIENT NAME:  Thomas [name], Thomas [name] MR#: [id number]

## 2013-08-21 NOTE — Progress Notes (Signed)
          Daily Rounding Note  08/21/2013, 9:32 AM  LOS: 4 days   SUBJECTIVE:       Stools green, brown, not dark or bloody.  Drank all golytely last night.   OBJECTIVE:         Vital signs in last 24 hours:    Temp:  [98.2 F (36.8 C)-98.6 F (37 C)] 98.6 F (37 C) (05/04 0449) Pulse Rate:  [86-100] 86 (05/04 0449) Resp:  [16-19] 19 (05/04 0449) BP: (99-116)/(56-77) 114/77 mmHg (05/04 0449) SpO2:  [99 %-100 %] 99 % (05/04 0449) Weight:  [131.09 kg (289 lb)] 131.09 kg (289 lb) (05/04 0449) Last BM Date: 08/18/13 General: pleasant, NAD   Heart: RRR Chest: clear Abdomen: soft, NT, ND.  Active BS  Extremities: no CCE Neuro/Psych:  Pleasant, relaxed, not confused.  Intake/Output from previous day: 05/03 0701 - 05/04 0700 In: 240 [P.O.:240] Out: 1850 [Urine:1850]  Intake/Output this shift:    Lab Results:  Recent Labs  08/19/13 0420 08/20/13 0506 08/21/13 0530  WBC 3.0* 2.9* 3.5*  HGB 11.3* 10.2* 10.0*  HCT 32.4* 29.0* 28.5*  PLT 82* 60* 66*   BMET  Recent Labs  08/19/13 0420 08/20/13 0506 08/21/13 0530  NA 134* 135* 135*  K 3.5* 3.3* 3.4*  CL 91* 93* 95*  CO2 28 30 28   GLUCOSE 100* 89 91  BUN 18 12 10   CREATININE 0.84 0.84 0.96  CALCIUM 8.6 8.1* 8.1*   LFT  Recent Labs  08/19/13 0420  PROT 6.2  ALBUMIN 3.2*  AST 168*  ALT 34  ALKPHOS 59  BILITOT 1.1    ASSESMENT:   *  Pancytopenia  *  FOBT +  *  ETOH abuse  *  Fatty liver, likely cirrhosis  *  ? Biliary sludge.   *  Dilated CM   PLAN   *  Tap water enemas, colon egd today.     Vena Rua  08/21/2013, 9:32 AM Pager: [phone]

## 2013-08-21 NOTE — Consult Note (Signed)
Thomas [name]   DOB:Jul 01, 1950   WC#:[id number]   VOH#:[id number]  Subjective: Patient denies any shortness of breath, chest pain or palpitations S/p EGD and colonoscopy today  Objective:  Filed Vitals:   08/21/13 2037  BP: 98/63  Pulse: 83  Temp: 98.5 F (36.9 C)  Resp: 18    Body mass index is 38.14 kg/(m^2).  Intake/Output Summary (Last 24 hours) at 08/21/13 2136 Last data filed at 08/21/13 2039  Gross per 24 hour  Intake   1040 ml  Output   1450 ml  Net   -410 ml     Sclerae unicteric  Oropharynx clear  No peripheral adenopathy  Lungs clear -- no rales or rhonchi  Heart irregular rate and rhythm  Abdomen benign  MSK no focal spinal tenderness, no peripheral edema  Neuro nonfocal    CBG (last 3)   Recent Labs  08/20/13 0623 08/21/13 0608 08/21/13 1141  GLUCAP 88 101* 85     Labs:  Lab Results  Component Value Date   WBC 3.5* 08/21/2013   HGB 10.0* 08/21/2013   HCT 28.5* 08/21/2013   MCV 101.4* 08/21/2013   PLT 66* 08/21/2013   NEUTROABS 1.6* 08/21/2013    @LASTCHEMISTRY @  Urine Studies No results found for this basename: UACOL, UAPR, USPG, UPH, UTP, UGL, UKET, UBIL, UHGB, UNIT, UROB, ULEU, UEPI, UWBC, URBC, UBAC, CAST, CRYS, UCOM, BILUA,  in the last 72 hours  Basic Metabolic Panel:  Recent Labs Lab 08/17/13 1340 08/17/13 2108 08/18/13 0837 08/19/13 0420 08/20/13 0506 08/21/13 0530  NA 133*  --  139 134* 135* 135*  K 3.6*  --  3.2* 3.5* 3.3* 3.4*  CL 87*  --  92* 91* 93* 95*  CO2 24  --  32 28 30 28   GLUCOSE 168*  --  102* 100* 89 91  BUN 23  --  20 18 12 10   CREATININE 0.70  --  0.80 0.84 0.84 0.96  CALCIUM 8.8  --  8.5 8.6 8.1* 8.1*  MG  --  1.5  --  1.6 1.5  --    GFR Estimated Creatinine Clearance: 116.2 ml/min (by C-G formula based on Cr of 0.96). Liver Function Tests:  Recent Labs Lab 08/17/13 1340 08/19/13 0420  AST 93* 168*  ALT 28 34  ALKPHOS 64 59  BILITOT 1.8* 1.1  PROT 6.8 6.2  ALBUMIN 3.4* 3.2*   No results found for  this basename: LIPASE, AMYLASE,  in the last 168 hours No results found for this basename: AMMONIA,  in the last 168 hours Coagulation profile No results found for this basename: INR, PROTIME,  in the last 168 hours  CBC:  Recent Labs Lab 08/17/13 1340 08/18/13 0837 08/18/13 1852 08/19/13 0420 08/20/13 0506 08/21/13 0530  WBC 2.7* 2.4* 3.0* 3.0* 2.9* 3.5*  NEUTROABS 2.3 1.5*  --  1.4* 1.2* 1.6*  HGB 13.1 11.5* 12.5* 11.3* 10.2* 10.0*  HCT 36.2* 33.4* 35.6* 32.4* 29.0* 28.5*  MCV 99.7 102.8* 101.4* 101.3* 100.7* 101.4*  PLT 49* 79* 88* 82* 60* 66*   Cardiac Enzymes: No results found for this basename: CKTOTAL, CKMB, CKMBINDEX, TROPONINI,  in the last 168 hours BNP: No components found with this basename: POCBNP,  CBG:  Recent Labs Lab 08/19/13 0610 08/20/13 0623 08/21/13 0608 08/21/13 1141  GLUCAP 100* 88 101* 85   D-Dimer No results found for this basename: DDIMER,  in the last 72 hours Hgb A1c No results found for this basename: HGBA1C,  in the last  72 hours Lipid Profile No results found for this basename: CHOL, HDL, LDLCALC, TRIG, CHOLHDL, LDLDIRECT,  in the last 72 hours Thyroid function studies No results found for this basename: TSH, T4TOTAL, FREET3, T3FREE, THYROIDAB,  in the last 72 hours Anemia work up  Recent Labs  08/20/13 0506 08/21/13 0530  RETICCTPCT 0.6 1.2   Microbiology No results found for this or any previous visit (from the past 240 hour(s)).    Studies:  US Soft Tissue Head/neck  08/21/2013   CLINICAL DATA:  Adenopathy suspected on physical exam  EXAM: ULTRASOUND OF HEAD/NECK SOFT TISSUES  TECHNIQUE: Ultrasound examination of the head and neck soft tissues was performed in the area of clinical concern.  COMPARISON:  None.  FINDINGS: Two left supraclavicular lymph nodes are identified both with characteristic ultrasound morphology, both 6 mm short axis diameter. No adenopathy, mass, cyst, or other lesion identified. Survey images of the  right supraclavicular region unremarkable.  IMPRESSION: Two normal-sized left supraclavicular lymph nodes are identified.   Electronically Signed   By: Arne Cleveland M.D.   On: 08/21/2013 16:21    Assessment/Plan:   1. Pancytopenia most likely secondary alcohol induced bone marrow suppression compounded by NSAID use versus secondary R/o splenomegaly: Recommend Ultrasound of the abdomen to assess for hepatosplenomegaly   2.Ultrasound of neck revealed  left supra clavicular lymphadenopathy. Spoke with radiology and radiology recommended CT of neck to assess for accesable lymphnodes for biopsy.   3.continue anticoagulation as per pharmacy recommendations with careful monitoring of platelet count    Wilmon Arms, MD Medical oncology 08/21/2013  9:36 PM

## 2013-08-21 NOTE — Transfer of Care (Signed)
Immediate Anesthesia Transfer of Care Note  Patient: Thomas [name]  Procedure(s) Performed: Procedure(s): ESOPHAGOGASTRODUODENOSCOPY (EGD) (N/A) COLONOSCOPY (N/A)  Patient Location: Endoscopy Unit  Anesthesia Type:MAC  Level of Consciousness: awake, alert  and oriented  Airway & Oxygen Therapy: Patient Spontanous Breathing and Patient connected to nasal cannula oxygen  Post-op Assessment: Report given to PACU RN, Post -op Vital signs reviewed and stable and Patient moving all extremities X 4  Post vital signs: Reviewed and stable  Complications: No apparent anesthesia complications

## 2013-08-22 ENCOUNTER — Encounter (HOSPITAL_COMMUNITY): Payer: Self-pay | Admitting: Internal Medicine

## 2013-08-22 ENCOUNTER — Encounter: Payer: Self-pay | Admitting: Internal Medicine

## 2013-08-22 ENCOUNTER — Inpatient Hospital Stay (HOSPITAL_COMMUNITY): Payer: 59

## 2013-08-22 LAB — CBC WITH DIFFERENTIAL/PLATELET
BASOS ABS: 0 10*3/uL (ref 0.0–0.1)
EOS ABS: 0 10*3/uL (ref 0.0–0.7)
HEMATOCRIT: 29.6 % — AB (ref 39.0–52.0)
Hemoglobin: 10.2 g/dL — ABNORMAL LOW (ref 13.0–17.0)
Lymphocytes Relative: 22 % (ref 12–46)
Lymphs Abs: 0.9 10*3/uL (ref 0.7–4.0)
MCH: 35.1 pg — AB (ref 26.0–34.0)
MCHC: 34.5 g/dL (ref 30.0–36.0)
MCV: 101.7 fL — AB (ref 78.0–100.0)
MONO ABS: 0.9 10*3/uL (ref 0.1–1.0)
Monocytes Relative: 21 % — ABNORMAL HIGH (ref 3–12)
NEUTROS ABS: 2.2 10*3/uL (ref 1.7–7.7)
Neutrophils Relative %: 55 % (ref 43–77)
Platelets: 73 10*3/uL — ABNORMAL LOW (ref 150–400)
RBC: 2.91 MIL/uL — ABNORMAL LOW (ref 4.22–5.81)
RDW: 14.7 % (ref 11.5–15.5)
WBC: 4 10*3/uL (ref 4.0–10.5)

## 2013-08-22 LAB — BASIC METABOLIC PANEL
BUN: 9 mg/dL (ref 6–23)
CALCIUM: 8.6 mg/dL (ref 8.4–10.5)
CO2: 27 meq/L (ref 19–32)
CREATININE: 1.07 mg/dL (ref 0.50–1.35)
Chloride: 98 mEq/L (ref 96–112)
GFR calc Af Amer: 85 mL/min — ABNORMAL LOW (ref >90)
GFR calc non Af Amer: 74 mL/min — ABNORMAL LOW (ref >90)
Glucose, Bld: 89 mg/dL (ref 70–99)
Potassium: 3.9 mEq/L (ref 3.7–5.3)
Sodium: 140 mEq/L (ref 137–147)

## 2013-08-22 LAB — GLUCOSE, CAPILLARY
GLUCOSE-CAPILLARY: 88 mg/dL (ref 70–99)
GLUCOSE-CAPILLARY: 107 mg/dL — AB (ref 70–99)
Glucose-Capillary: 79 mg/dL (ref 70–99)
Glucose-Capillary: 108 mg/dL — ABNORMAL HIGH (ref 70–99)

## 2013-08-22 LAB — RETICULOCYTES
RBC.: 2.89 MIL/uL — ABNORMAL LOW (ref 4.22–5.81)
Retic Count, Absolute: 75.1 10*3/uL (ref 19.0–186.0)
Retic Ct Pct: 2.6 % (ref 0.4–3.1)

## 2013-08-22 LAB — LEAD, BLOOD: Lead-Whole Blood: 60 ug/dL — ABNORMAL HIGH (ref <10)

## 2013-08-22 MED ORDER — REGADENOSON 0.4 MG/5ML IV SOLN
0.4000 mg | Freq: Once | INTRAVENOUS | Status: AC
  Administered 2013-08-23: 0.4 mg via INTRAVENOUS
  Filled 2013-08-22: qty 5

## 2013-08-22 MED ORDER — CARVEDILOL 6.25 MG PO TABS
6.2500 mg | ORAL_TABLET | Freq: Two times a day (BID) | ORAL | Status: DC
  Administered 2013-08-22 – 2013-08-24 (×4): 6.25 mg via ORAL
  Filled 2013-08-22 (×8): qty 1

## 2013-08-22 MED ORDER — FUROSEMIDE 10 MG/ML IJ SOLN
40.0000 mg | Freq: Two times a day (BID) | INTRAMUSCULAR | Status: DC
  Administered 2013-08-22 – 2013-08-24 (×4): 40 mg via INTRAVENOUS
  Filled 2013-08-22 (×6): qty 4

## 2013-08-22 NOTE — Progress Notes (Signed)
Patient seen, examined, and I agree with the above documentation, including the assessment and plan. New diagnosis of cirrhosis, alcohol related. Viral hepatitis serologies pending H. pylori antibody pending, treated positive I expect gastritis was alcohol related and also portal hypertensive gastropathy related Small polyps are adenoma, repeat colonoscopy 5 years Has GI followup with Dr. Carlean Purl,  Would continue daily PPI until then Alcohol abstinence paramount given advanced liver disease and also to help reduce portal pressure. Consider outpatient rehabilitation and importance of AA meetings emphasized Labs consistent with some degree of acute alcoholic hepatitis Check LFTs tomorrow Please call with questions

## 2013-08-22 NOTE — Care Management Note (Signed)
    Page 1 of 2   08/24/2013     4:15:14 PM CARE MANAGEMENT NOTE 08/24/2013  Patient:  Thomas [name],Thomas [name]   Account Number:  [id number]  Date Initiated:  08/22/2013  Documentation initiated by:  Welford Christmas  Subjective/Objective Assessment:   Pt adm on 4/30 with AFIB with RVR, CHF, ETOH abuse.  PTA, pt independent, lives with mother.     Action/Plan:   Will follow for dc needs as pt progresses.  P.T. recommending HHPT; would benefit from [facility] for CHF follow up at dc.   Anticipated DC Date:  08/25/2013   Anticipated DC Plan:  [place]  CM consult      [facility] Choice  HOME HEALTH   Choice offered to / List presented to:  C-1 Patient   DME arranged  Vassie Moselle      DME agency  [place] arranged  HH-1 RN  [facility]      [place].   Status of service:  Completed, signed off Medicare Important Message given?   (If response is "NO", the following Medicare IM given date fields will be blank) Date Medicare IM given:   Date Additional Medicare IM given:    Discharge Disposition:  [place]  Per UR Regulation:  Reviewed for med. necessity/level of care/duration of stay  If discussed at [place] of Stay Meetings, dates discussed:   08/24/2013    Comments:  08/24/13 Ellan Lambert, RN, BSN [phone] Pt for dc home today; agreeable to [facility] care.  Referral to [facility] for follow up; start of care 24-48h post dc date. Requests RW for home.  Follow up appt made at [facility] for 5/13 at 4:30pm.

## 2013-08-22 NOTE — Evaluation (Signed)
Physical Therapy Evaluation Patient Details Name: Thomas [name] MRN: [id number] DOB: 07-Feb-1951 Today's Date: 08/22/2013   History of Present Illness  47 male with a history of obesity, ETOH abuse and arthritis. Pt to ED with dizziness and weakness and found to have A-fib with RVR. Pt also with pancytopenia and seen by hematology.  Clinical Impression  Pt admitted with above. Pt currently with functional limitations due to the deficits listed below (see PT Problem List).  Pt will benefit from skilled PT to increase their independence and safety with mobility to allow discharge to the venue listed below.       Follow Up Recommendations Home health PT;Supervision - Intermittent    Equipment Recommendations  Rolling walker with 5" wheels    Recommendations for Other Services       Precautions / Restrictions Precautions Precautions: Fall      Mobility  Bed Mobility Overal bed mobility: Modified Independent                Transfers Overall transfer level: Needs assistance Equipment used: Rolling walker (2 wheeled);None Transfers: Sit to/from Stand Sit to Stand: Min guard         General transfer comment: For balance and safety.  Ambulation/Gait Ambulation/Gait assistance: Min assist Ambulation Distance (Feet): 70 Feet Assistive device: Rolling walker (2 wheeled);None Gait Pattern/deviations: Step-through pattern;Decreased step length - right;Decreased step length - left;Trunk flexed Gait velocity: decr Gait velocity interpretation: Below normal speed for age/gender General Gait Details: Verbal cues to stay closer to walker and to stand more erect.  Pt with hips externally rotated and heavy reliance on arms.  Stairs            Wheelchair Mobility    Modified Rankin (Stroke Patients Only)       Balance Overall balance assessment: Needs assistance         Standing balance support: During functional activity;No upper extremity supported Standing  balance-Leahy Scale: Fair                               Pertinent Vitals/Pain VSS    Home Living Family/patient expects to be discharged to:: Private residence Living Arrangements: Parent Available Help at Discharge: Family Type of Home: House Home Access: Stairs to enter Entrance Stairs-Rails: Right Entrance Stairs-Number of Steps: 3-4 Home Layout: One level Home Equipment: Strawberry - single point      Prior Function Level of Independence: Independent               Hand Dominance        Extremity/Trunk Assessment   Upper Extremity Assessment: Generalized weakness           Lower Extremity Assessment: Generalized weakness         Communication   Communication: No difficulties  Cognition Arousal/Alertness: Awake/alert Behavior During Therapy: WFL for tasks assessed/performed Overall Cognitive Status: Within Functional Limits for tasks assessed                      General Comments      Exercises        Assessment/Plan    PT Assessment Patient needs continued PT services  PT Diagnosis Difficulty walking;Abnormality of gait;Generalized weakness   PT Problem List Decreased strength;Decreased balance;Decreased activity tolerance;Decreased mobility;Obesity;Decreased knowledge of use of DME;Decreased safety awareness;Decreased knowledge of precautions  PT Treatment Interventions DME instruction;Gait training;Functional mobility training;Therapeutic activities;Therapeutic exercise;Balance training;Patient/family education   PT Goals (  Current goals can be found in the Care Plan section) Acute Rehab PT Goals Patient Stated Goal: to get stronger PT Goal Formulation: With patient Time For Goal Achievement: 08/29/13 Potential to Achieve Goals: Good    Frequency Min 3X/week   Barriers to discharge        Co-evaluation               End of Session   Activity Tolerance: Patient tolerated treatment well Patient left: in  bed;with call bell/phone within reach           Time: 1154-1216 PT Time Calculation (min): 22 min   Charges:   PT Evaluation $Initial PT Evaluation Tier I: 1 Procedure $PT Re-evaluation: 1 Procedure PT Treatments $Gait Training: 8-22 mins   PT G CodesShary Decamp Cartina Brousseau 08/22/2013, 2:01 PM  [employer] PT [phone]

## 2013-08-22 NOTE — Progress Notes (Signed)
Daily Rounding Note  08/22/2013, 12:27 PM  LOS: 5 days   SUBJECTIVE:       Passing flatus, eating solids.  No complaints except unsteady and weak on feet. Staff helped him walk with walker.  Still on amiodarone drip.  OBJECTIVE:         Vital signs in last 24 hours:    Temp:  [97.5 F (36.4 C)-98.6 F (37 C)] 98.6 F (37 C) (05/05 0405) Pulse Rate:  [59-87] 86 (05/05 0405) Resp:  [13-20] 18 (05/05 0405) BP: (98-127)/(63-90) 127/78 mmHg (05/05 0405) SpO2:  [94 %-100 %] 97 % (05/05 0405) Weight:  [131.9 kg (290 lb 12.6 oz)] 131.9 kg (290 lb 12.6 oz) (05/05 0500) Last BM Date: 08/22/13 General: looks chronically unwell, obese   Heart: irreg, irreg.  Rate in 90s. Chest: clear, no labored breathing Abdomen: obese, soft, NT, ND.  Active BS  Extremities: 3 + pedal edema Neuro/Psych:  Pleasant, no tremor, no confusion, no asterixis.   Intake/Output from previous day: 05/04 0701 - 05/05 0700 In: 1811.7 [P.O.:240; I.V.:1571.7] Out: 2200 [Urine:2200]  Intake/Output this shift: Total I/O In: 276.7 [P.O.:240; I.V.:36.7] Out: 200 [Urine:200]  Lab Results:  Recent Labs  08/20/13 0506 08/21/13 0530 08/22/13 0418  WBC 2.9* 3.5* 4.0  HGB 10.2* 10.0* 10.2*  HCT 29.0* 28.5* 29.6*  PLT 60* 66* 73*   BMET  Recent Labs  08/20/13 0506 08/21/13 0530 08/22/13 0418  NA 135* 135* 140  K 3.3* 3.4* 3.9  CL 93* 95* 98  CO2 30 28 27   GLUCOSE 89 91 89  BUN 12 10 9   CREATININE 0.84 0.96 1.07  CALCIUM 8.1* 8.1* 8.6   LFT No results found for this basename: PROT, ALBUMIN, AST, ALT, ALKPHOS, BILITOT, BILIDIR, IBILI,  in the last 72 hours PT/INR No results found for this basename: LABPROT, INR,  in the last 72 hours Hepatitis Panel No results found for this basename: HEPBSAG, HCVAB, HEPAIGM, HEPBIGM,  in the last 72 hours  Studies/Results: Ct Soft Tissue Neck W Contrast  08/22/2013   CLINICAL DATA:  Lymphadenopathy.   EXAM: CT NECK WITH CONTRAST  TECHNIQUE: Multidetector CT imaging of the neck was performed using the standard protocol following the bolus administration of intravenous contrast.  CONTRAST:  146mL OMNIPAQUE IOHEXOL 300 MG/ML  SOLN  COMPARISON:  US SOFT TISSUE HEAD/NECK dated 08/21/2013; CT ANGIO CHEST W/CM &/OR WO/CM dated 08/18/2013  FINDINGS: Prominent venous structures in the supraclavicular fossa without discrete nodal conglomeration. 7 mm round left level 4 versus 5b lymph node, in addition to smaller level 5 lymph nodes (coronal 58/101). No lymphadenopathy by CT size criteria.  Aerodigestive tract is nonsuspicious, retropharyngeal course of the carotid arteries mildly deforms the posterior wall of the pharynx. Mild calcific atherosclerosis of the carotid bulbs. No free fluid or focal without collections of the neck. Normal appearance of the major salivary glands and thyroid gland though, evaluation somewhat limited by shoulder attenuation.  Strain cervical lordosis with moderate degenerative disc disease. Mild levoscoliosis. Small right maxillary mucosal retention cyst with mucosal thickening. Mastoid air cells are well aerated.  IMPRESSION: No lymphadenopathy by CT size criteria; 7 mm left level 4 versus 5b round lymph node.   Electronically Signed   By: Elon Alas   On: 08/22/2013 04:05   US Soft Tissue Head/neck  08/21/2013   CLINICAL DATA:  Adenopathy suspected on physical exam  EXAM: ULTRASOUND OF HEAD/NECK SOFT TISSUES  TECHNIQUE: Ultrasound examination  of the head and neck soft tissues was performed in the area of clinical concern.  COMPARISON:  None.  FINDINGS: Two left supraclavicular lymph nodes are identified both with characteristic ultrasound morphology, both 6 mm short axis diameter. No adenopathy, mass, cyst, or other lesion identified. Survey images of the right supraclavicular region unremarkable.  IMPRESSION: Two normal-sized left supraclavicular lymph nodes are identified.    Electronically Signed   By: Arne Cleveland M.D.   On: 08/21/2013 16:21   US Abdomen Complete  08/22/2013   CLINICAL DATA:  Pancytopenia  EXAM: ULTRASOUND ABDOMEN COMPLETE  COMPARISON:  None.  FINDINGS: Gallbladder:  Mild gallbladder distention. No wall thickening or stones. Negative sonographic Murphy's.  Common bile duct:  Diameter: Normal caliber, 4 mm  Liver:  Increased/heterogeneous echotexture suggesting fatty infiltration or intrinsic liver disease. No biliary ductal dilatation or focal abnormality.  IVC:  No abnormality visualized.  Pancreas:  Visualized portion unremarkable.  Spleen:  Normal in craniocaudal length at 6.7 cm.  No focal abnormality.  Right Kidney:  Length: 12.6. Echogenicity within normal limits. No mass or hydronephrosis visualized.  Left Kidney:  Length: 13.3. Normal echogenicity. No hydronephrosis. 5 mm echogenic focus within the lower pole may represent a small angiomyolipoma.  Abdominal aorta:  No aneurysm visualized.  Other findings:  None.  IMPRESSION: Gallbladder appears mildly distended, but there is no wall thickening or sonographic Murphy's sign. Negative for stones.  Suspect small angiomyolipoma in the lower pole of the left kidney.  Increased echotexture throughout the liver compatible with fatty infiltration or intrinsic liver disease.  No evidence of splenomegaly.  No acute findings.   Electronically Signed   By: Rolm Baptise M.D.   On: 08/22/2013 10:23   Scheduled Meds: . carvedilol  6.25 mg Oral BID WC  . digoxin  0.125 mg Oral Daily  . folic acid  5 mg Oral Daily  . furosemide  40 mg Intravenous BID  . magnesium sulfate 1 - 4 g bolus IVPB  2 g Intravenous Once  . multivitamin with minerals  1 tablet Oral Daily  . pantoprazole  40 mg Oral Q0600  . potassium chloride  40 mEq Oral TID  . [START ON 08/23/2013] regadenoson  0.4 mg Intravenous Once  . thiamine  100 mg Oral Daily   Or  . thiamine  100 mg Intravenous Daily   Continuous Infusions: . sodium chloride  20 mL/hr at 08/22/13 0800  . sodium chloride 20 mL/hr at 08/21/13 1750  . lactated ringers 1,000 mL (08/21/13 1131)   PRN Meds:.HYDROmorphone (DILAUDID) injection, promethazine  Radiology/Imaging: ULTRASOUND ABDOMEN COMPLETE 08/22/13 FINDINGS:  Gallbladder: Mild gallbladder distention. No wall thickening or stones. Negative sonographic Murphy's.  Common bile duct: Diameter: Normal caliber, 4 mm  Liver: Increased/heterogeneous echotexture suggesting fatty infiltration or  intrinsic liver disease. No biliary ductal dilatation or focal abnormality.  IVC: No abnormality visualized.  Pancreas: Visualized portion unremarkable.  Spleen: Normal in craniocaudal length at 6.7 cm. No focal abnormality.  Right Kidney: Length: 12.6. Echogenicity within normal limits. No mass or  hydronephrosis visualized.  Left Kidney: Length: 13.3. Normal echogenicity. No hydronephrosis. 5 mm echogenic  focus within the lower pole may represent a small angiomyolipoma.  Abdominal aorta: No aneurysm visualized.  Other findings: None.  IMPRESSION:  Gallbladder appears mildly distended, but there is no wall  thickening or sonographic Murphy's sign. Negative for stones.  Suspect small angiomyolipoma in the lower pole of the left kidney.  Increased echotexture throughout the liver compatible with fatty  infiltration or intrinsic liver disease. No evidence of splenomegaly.  No acute findings.  CT NECK WITH CONTRAST 08/21/2013 IMPRESSION:  No lymphadenopathy by CT size criteria; 7 mm left level 4 versus 5b  round lymph node.  ULTRASOUND OF HEAD/NECK SOFT TISSUES 08/21/13 FINDINGS:  Two left supraclavicular lymph nodes are identified both with  characteristic ultrasound morphology, both 6 mm short axis diameter.  No adenopathy, mass, cyst, or other lesion identified. Survey images  of the right supraclavicular region unremarkable.  IMPRESSION:  Two normal-sized left supraclavicular lymph nodes are  identified.   Pathology Colon, biopsy, Right ascending polyp-1/ descending polyp-1 - TUBULAR ADENOMA(S). - HIGH GRADE DYSPLASIA IS NOT IDENTIFIED.  ASSESMENT:   *  Anemia.  FOBT +.  Using Aleve PTA.  Colonoscopy 08/21/13: sessile polyps removed from ascending and descending colon.  Sigmoid tics.  Cecum visualization incomplete.  Pathology shows Tubular adenomas, no HGD EGD 08/21/13: portal hypertensive  Gastropathy, acute gastritis, small HH, bulbar duodentitis.  Serum H Pylori pending  *  New diagnoses of cirrhosis based on portal htn, hx ETOH abuse, may also have element of fatty liver.  .  However Ultrasound shows fatty liver.  Hx ETOH abuse, last ETOH was near day of admission.   *  Pancytopenia.   *  A fib with RVR. Remains on Amio drip.   *  Dilated CM. 30% EF.  myoview study tomorrow to assess for vascular ischemia.   *  Supraclavicular adenopathy. CT and US neck ordered.    PLAN   *  Daily PPI,  *  Has ROV set for July with Dr Carlean Purl. *  Will need surveillance colonoscopy in 08/2018, in 5 years.  *  Total ETOH abstinence.  *  If H Pylori is + will plan to treat.  *  Hold initiation of anticoagulation until 08/25/13.  This will lessen potential for post polypectomy bleed. With the portal hypertension however, he may not tolerated anticoagulation due to GI bleeding.  *   Consider Cologuard as an outpatient given incomplete visualization of the cecum.  Dr Carlean Purl can decide.  *  Will check hepatitis serologies to rule out B/C     Vena Rua  08/22/2013, 12:27 PM Pager: [phone]

## 2013-08-22 NOTE — Progress Notes (Signed)
Subjective: No SOB, CP.  Legs feel weak when he walks  Objective: Vital signs in last 24 hours: Temp:  [97.5 F (36.4 C)-98.9 F (37.2 C)] 98.6 F (37 C) (05/05 0405) Pulse Rate:  [59-87] 86 (05/05 0405) Resp:  [13-20] 18 (05/05 0405) BP: (98-127)/(63-90) 127/78 mmHg (05/05 0405) SpO2:  [94 %-100 %] 97 % (05/05 0405) Weight:  [290 lb 12.6 oz (131.9 kg)] 290 lb 12.6 oz (131.9 kg) (05/05 0500) Last BM Date: 08/22/13  Intake/Output from previous day: 05/04 0701 - 05/05 0700 In: 1811.7 [P.O.:240; I.V.:1571.7] Out: 2200 [Urine:2200] Intake/Output this shift: Total I/O In: 276.7 [P.O.:240; I.V.:36.7] Out: 200 [Urine:200]  Medications Current Facility-Administered Medications  Medication Dose Route Frequency Provider Last Rate Last Dose  . 0.9 %  sodium chloride infusion   Intravenous Continuous Juanita Craver, MD 20 mL/hr at 08/22/13 0800    . 0.9 %  sodium chloride infusion   Intravenous Continuous Vena Rua, PA-C 20 mL/hr at 08/21/13 1750    . amiodarone IV infusion  30 mg/hr Intravenous Continuous Alwyn Pea, MD 16.7 mL/hr at 08/22/13 0800 30 mg/hr at 08/22/13 0800  . digoxin (LANOXIN) tablet 0.125 mg  0.125 mg Oral Daily Sueanne Margarita, MD   0.125 mg at 08/21/13 1751  . diltiazem (CARDIZEM) 100 mg in dextrose 5 % 100 mL infusion  5-15 mg/hr Intravenous Titrated Charles B. Karle Starch, MD   10 mg/hr at 08/17/13 1509  . folic acid (FOLVITE) tablet 5 mg  5 mg Oral Daily Padma Kamineni, MD   5 mg at 08/21/13 1751  . furosemide (LASIX) injection 40 mg  40 mg Intravenous Daily Tarri Fuller, PA-C   40 mg at 08/21/13 1752  . HYDROmorphone (DILAUDID) injection 0.25-0.5 mg  0.25-0.5 mg Intravenous Q5 min PRN Duane Boston, MD      . lactated ringers infusion   Intravenous Continuous Jerene Bears, MD 50 mL/hr at 08/21/13 1131 1,000 mL at 08/21/13 1131  . magnesium sulfate IVPB 2 g 50 mL  2 g Intravenous Once Alwyn Pea, MD      . multivitamin with minerals tablet 1 tablet  1  tablet Oral Daily Tarri Fuller, PA-C   1 tablet at 08/21/13 1752  . pantoprazole (PROTONIX) EC tablet 40 mg  40 mg Oral Q0600 Erlene Quan, PA-C   40 mg at 08/22/13 4854  . potassium chloride SA (K-DUR,KLOR-CON) CR tablet 40 mEq  40 mEq Oral TID Tarri Fuller, PA-C   40 mEq at 08/21/13 2147  . promethazine (PHENERGAN) injection 6.25-12.5 mg  6.25-12.5 mg Intravenous Q15 min PRN Duane Boston, MD      . thiamine (VITAMIN B-1) tablet 100 mg  100 mg Oral Daily Tarri Fuller, PA-C   100 mg at 08/21/13 1752   Or  . thiamine (B-1) injection 100 mg  100 mg Intravenous Daily Tarri Fuller, PA-C        PE: General appearance: alert, cooperative and no distress Lungs: clear to auscultation bilaterally Heart: irregularly irregular rhythm and No MM Abdomen: +BS nontender Extremities: 1 left LEE Pulses: 2+ and symmetric Skin: Warm and dry Neurologic: Grossly normal  Lab Results:   Recent Labs  08/20/13 0506 08/21/13 0530 08/22/13 0418  WBC 2.9* 3.5* 4.0  HGB 10.2* 10.0* 10.2*  HCT 29.0* 28.5* 29.6*  PLT 60* 66* 73*   BMET  Recent Labs  08/20/13 0506 08/21/13 0530 08/22/13 0418  NA 135* 135* 140  K 3.3* 3.4* 3.9  CL 93* 95* 98  CO2 30 28 27   GLUCOSE 89 91 89  BUN 12 10 9   CREATININE 0.84 0.96 1.07  CALCIUM 8.1* 8.1* 8.6     Assessment/Plan    1.  Atrial fibrillation with RVR  Still in afib.  Improved.  Rate = 86 - 110 , on amio drip.  Will dc.  He has significant liver disease.  2. Chronic systolic CHF:  His EF is 97% by echo.  Likely due to his ETOH abuse., has mod. MR Starting coreg.  Will DC dilt drip, increase lasix IV  For myoview tomorrow to R/O CAD.   3.  Pancytopenia:  With elevated MCV.   Partially from ETOH use.   GI did not find any specific source of bleeding.   4. Alcoholic liver disease:  Elevated AST.  , evidence of cirrhosis on CT    5.    Hypokalemia  Continue replacement.  Change to TID

## 2013-08-22 NOTE — Consult Note (Signed)
[facility] Medical Consultation  Thomas [name] ACZ:[id number] DOB: Oct 09, 1950 DOA: 08/17/2013 PCP: No PCP Per Patient   Requesting physician: Dr. Acie Fredrickson Date of consultation: 08/22/13 Reason for consultation: Transfer of care  Impression/Recommendations Principal Problem:   Atrial fibrillation with RVR Active Problems:   SOB (shortness of breath)   Pancytopenia   ETOH abuse   Hypokalemia   Edema of left lower extremity   Elevated LFTs   Positive D dimer   Alcoholic liver disease   Heme + stool   DCM (dilated cardiomyopathy)   Acute systolic CHF (congestive heart failure)   Diverticulosis of colon without hemorrhage   Benign neoplasm of colon   Portal hypertensive gastropathy   Unspecified gastritis and gastroduodenitis without mention of hemorrhage  1. Afib w/ RVR 1. Defer to Cardiology service 2. For stress test tomorrow 2. Pancytopenia related to ETOH abuse 1. Heme-Onc following 2. Recs for abd [place] 3. Blood counts appear stable thus far 4. Anticoagulation started per Cardiology - monitor closely for bleeding 3. ETOH abuse 1. Outside window for DT's 2. Monitor for now 3. May need treatment program as outpatient 4. Alcoholic liver disease 1. GI following 2. Hepatitis panel pending 3. LFT's appear stable 4. GB walls appear mildly thickened, neg for Murphy's sign - fatty liver on [place] 5. Anemia w/ heme pos stools 1. Pt is s/p EGD and colon on 5/4 2. Hypertensive gastropathy and gastritis seen with duodenal inflammation on EGD 3. Recs to f/u h.pylori and if pos, would treat 4. Will transfer to hospitalist service 6. DVT prophylaxis 1. scd  Chief Complaint: "I'm doing well."  HPI:  62yo with a hx of etoh abuse who initially presented with dizziness, found to be in afib w/ RVR in the ED. The patient was subsequently admitted to the Cardiology service with heart rate eventually improved with dgoxin and amiodarone. The patient was also found to have pancytopenia  with macrocytic anemia likely related to chronic etoh abuse.During the course, the patient was found to have heme pos stools. GI was consulted and the patient underwent egd and colon on 5/4, notably pos for gastritis and inflammation of the duodenum. Heme/Onc was also consulted to help manage pancytopenia - felt to be secondary to marrow suppression. The hospitalist service was consulted for consideration of transfer of care  Review of Systems:  Per above, the remainder of the 10pt ros reviewed and are neg  Past Medical History  Diagnosis Date  . Obesity   . Arthritis    Past Surgical History  Procedure Laterality Date  . Esophagogastroduodenoscopy N/A 08/21/2013    Procedure: ESOPHAGOGASTRODUODENOSCOPY (EGD);  Surgeon: Jerene Bears, MD;  Location: [facility] ENDOSCOPY;  Service: Endoscopy;  Laterality: N/A;  . Colonoscopy N/A 08/21/2013    Procedure: COLONOSCOPY;  Surgeon: Jerene Bears, MD;  Location: [facility] ENDOSCOPY;  Service: Endoscopy;  Laterality: N/A;   Social History:  reports that he has never smoked. He has never used smokeless tobacco. He reports that he drinks about 18 ounces of alcohol per week. He reports that he does not use illicit drugs.  No Known Allergies Family History  Problem Relation Age of Onset  . Hypertension Father     Prior to Admission medications   Medication Sig Start Date End Date Taking? Authorizing Provider  naproxen sodium (ANAPROX) 220 MG tablet Take 220 mg by mouth every 8 (eight) hours as needed (pain).   Yes Historical Provider, MD   Physical Exam: Blood pressure 127/78, pulse 86, temperature 98.6 F (  37 C), temperature source Oral, resp. rate 18, height 6\' 1"  (1.854 m), weight 131.9 kg (290 lb 12.6 oz), SpO2 97.00%. Filed Vitals:   08/21/13 1445 08/21/13 2037 08/22/13 0405 08/22/13 0500  BP: 117/83 98/63 127/78   Pulse: 81 83 86   Temp: 97.5 F (36.4 C) 98.5 F (36.9 C) 98.6 F (37 C)   TempSrc: Oral Oral Oral   Resp: 20 18 18    Height:      Weight:     131.9 kg (290 lb 12.6 oz)  SpO2: 97% 99% 97%      General:  Awake, in nad  Eyes: PERRL B  ENT: membranes moist, dentition fair  Neck: trachea midline, neck supple  Cardiovascular: regular, s1, s2  Respiratory: normal resp effort, no wheezing  Abdomen: soft, nondistended  Skin: normal skin turgor, no abnormal skin lesions seen  Musculoskeletal: perfused, no clubbing  Psychiatric: mood/affect normal // no auditory/visual hallucinations  Neurologic: cn2-12 grossly intact, strength/sensation intact  Labs on Admission:  Basic Metabolic Panel:  Recent Labs Lab 08/17/13 1340 08/17/13 2108 08/18/13 0837 08/19/13 0420 08/20/13 0506 08/21/13 0530 08/22/13 0418  NA 133*  --  139 134* 135* 135* 140  K 3.6*  --  3.2* 3.5* 3.3* 3.4* 3.9  CL 87*  --  92* 91* 93* 95* 98  CO2 24  --  32 28 30 28 27   GLUCOSE 168*  --  102* 100* 89 91 89  BUN 23  --  20 18 12 10 9   CREATININE 0.70  --  0.80 0.84 0.84 0.96 1.07  CALCIUM 8.8  --  8.5 8.6 8.1* 8.1* 8.6  MG  --  1.5  --  1.6 1.5  --   --    Liver Function Tests:  Recent Labs Lab 08/17/13 1340 08/19/13 0420  AST 93* 168*  ALT 28 34  ALKPHOS 64 59  BILITOT 1.8* 1.1  PROT 6.8 6.2  ALBUMIN 3.4* 3.2*   No results found for this basename: LIPASE, AMYLASE,  in the last 168 hours No results found for this basename: AMMONIA,  in the last 168 hours CBC:  Recent Labs Lab 08/18/13 0837 08/18/13 1852 08/19/13 0420 08/20/13 0506 08/21/13 0530 08/22/13 0418  WBC 2.4* 3.0* 3.0* 2.9* 3.5* 4.0  NEUTROABS 1.5*  --  1.4* 1.2* 1.6* 2.2  HGB 11.5* 12.5* 11.3* 10.2* 10.0* 10.2*  HCT 33.4* 35.6* 32.4* 29.0* 28.5* 29.6*  MCV 102.8* 101.4* 101.3* 100.7* 101.4* 101.7*  PLT 79* 88* 82* 60* 66* 73*   Cardiac Enzymes: No results found for this basename: CKTOTAL, CKMB, CKMBINDEX, TROPONINI,  in the last 168 hours BNP: No components found with this basename: POCBNP,  CBG:  Recent Labs Lab 08/19/13 0610 08/20/13 0623  08/21/13 0608 08/21/13 1141 08/22/13 0613  GLUCAP 100* 88 101* 85 79    Radiological Exams on Admission: Ct Soft Tissue Neck W Contrast  08/22/2013   CLINICAL DATA:  Lymphadenopathy.  EXAM: CT NECK WITH CONTRAST  TECHNIQUE: Multidetector CT imaging of the neck was performed using the standard protocol following the bolus administration of intravenous contrast.  CONTRAST:  17mL OMNIPAQUE IOHEXOL 300 MG/ML  SOLN  COMPARISON:  US SOFT TISSUE HEAD/NECK dated 08/21/2013; CT ANGIO CHEST W/CM &/OR WO/CM dated 08/18/2013  FINDINGS: Prominent venous structures in the supraclavicular fossa without discrete nodal conglomeration. 7 mm round left level 4 versus 5b lymph node, in addition to smaller level 5 lymph nodes (coronal 58/101). No lymphadenopathy by CT size criteria.  Aerodigestive  tract is nonsuspicious, retropharyngeal course of the carotid arteries mildly deforms the posterior wall of the pharynx. Mild calcific atherosclerosis of the carotid bulbs. No free fluid or focal without collections of the neck. Normal appearance of the major salivary glands and thyroid gland though, evaluation somewhat limited by shoulder attenuation.  Strain cervical lordosis with moderate degenerative disc disease. Mild levoscoliosis. Small right maxillary mucosal retention cyst with mucosal thickening. Mastoid air cells are well aerated.  IMPRESSION: No lymphadenopathy by CT size criteria; 7 mm left level 4 versus 5b round lymph node.   Electronically Signed   By: Elon Alas   On: 08/22/2013 04:05   US Soft Tissue Head/neck  08/21/2013   CLINICAL DATA:  Adenopathy suspected on physical exam  EXAM: ULTRASOUND OF HEAD/NECK SOFT TISSUES  TECHNIQUE: Ultrasound examination of the head and neck soft tissues was performed in the area of clinical concern.  COMPARISON:  None.  FINDINGS: Two left supraclavicular lymph nodes are identified both with characteristic ultrasound morphology, both 6 mm short axis diameter. No adenopathy,  mass, cyst, or other lesion identified. Survey images of the right supraclavicular region unremarkable.  IMPRESSION: Two normal-sized left supraclavicular lymph nodes are identified.   Electronically Signed   By: Arne Cleveland M.D.   On: 08/21/2013 16:21   US Abdomen Complete  08/22/2013   CLINICAL DATA:  Pancytopenia  EXAM: ULTRASOUND ABDOMEN COMPLETE  COMPARISON:  None.  FINDINGS: Gallbladder:  Mild gallbladder distention. No wall thickening or stones. Negative sonographic Murphy's.  Common bile duct:  Diameter: Normal caliber, 4 mm  Liver:  Increased/heterogeneous echotexture suggesting fatty infiltration or intrinsic liver disease. No biliary ductal dilatation or focal abnormality.  IVC:  No abnormality visualized.  Pancreas:  Visualized portion unremarkable.  Spleen:  Normal in craniocaudal length at 6.7 cm.  No focal abnormality.  Right Kidney:  Length: 12.6. Echogenicity within normal limits. No mass or hydronephrosis visualized.  Left Kidney:  Length: 13.3. Normal echogenicity. No hydronephrosis. 5 mm echogenic focus within the lower pole may represent a small angiomyolipoma.  Abdominal aorta:  No aneurysm visualized.  Other findings:  None.  IMPRESSION: Gallbladder appears mildly distended, but there is no wall thickening or sonographic Murphy's sign. Negative for stones.  Suspect small angiomyolipoma in the lower pole of the left kidney.  Increased echotexture throughout the liver compatible with fatty infiltration or intrinsic liver disease.  No evidence of splenomegaly.  No acute findings.   Electronically Signed   By: Rolm Baptise M.D.   On: 08/22/2013 10:23    Time spent: 99min  Stephen K Chiu [facility] Pager [phone]  If 7PM-7AM, please contact night-coverage [email or web address] Password [email or web address] 08/22/2013, 1:10 PM

## 2013-08-23 ENCOUNTER — Encounter (HOSPITAL_COMMUNITY): Payer: 59

## 2013-08-23 ENCOUNTER — Inpatient Hospital Stay (HOSPITAL_COMMUNITY): Payer: 59

## 2013-08-23 DIAGNOSIS — R079 Chest pain, unspecified: Secondary | ICD-10-CM

## 2013-08-23 LAB — RETICULOCYTES
RBC.: 2.9 MIL/uL — ABNORMAL LOW (ref 4.22–5.81)
RETIC CT PCT: 4.1 % — AB (ref 0.4–3.1)
Retic Count, Absolute: 118.9 10*3/uL (ref 19.0–186.0)

## 2013-08-23 LAB — CBC WITH DIFFERENTIAL/PLATELET
BASOS ABS: 0 10*3/uL (ref 0.0–0.1)
BASOS PCT: 0 % (ref 0–1)
Eosinophils Absolute: 0.1 10*3/uL (ref 0.0–0.7)
Eosinophils Relative: 1 % (ref 0–5)
HEMATOCRIT: 29.4 % — AB (ref 39.0–52.0)
HEMOGLOBIN: 10.2 g/dL — AB (ref 13.0–17.0)
Lymphocytes Relative: 21 % (ref 12–46)
Lymphs Abs: 0.8 10*3/uL (ref 0.7–4.0)
MCH: 35.4 pg — ABNORMAL HIGH (ref 26.0–34.0)
MCHC: 34.7 g/dL (ref 30.0–36.0)
MCV: 102.1 fL — ABNORMAL HIGH (ref 78.0–100.0)
MONOS PCT: 23 % — AB (ref 3–12)
Monocytes Absolute: 0.9 10*3/uL (ref 0.1–1.0)
NEUTROS ABS: 2.1 10*3/uL (ref 1.7–7.7)
NEUTROS PCT: 54 % (ref 43–77)
Platelets: 88 10*3/uL — ABNORMAL LOW (ref 150–400)
RBC: 2.88 MIL/uL — ABNORMAL LOW (ref 4.22–5.81)
RDW: 15 % (ref 11.5–15.5)
WBC: 3.8 10*3/uL — AB (ref 4.0–10.5)

## 2013-08-23 LAB — COMPREHENSIVE METABOLIC PANEL
ALT: 29 U/L (ref 0–53)
AST: 63 U/L — ABNORMAL HIGH (ref 0–37)
Albumin: 2.8 g/dL — ABNORMAL LOW (ref 3.5–5.2)
Alkaline Phosphatase: 54 U/L (ref 39–117)
BUN: 9 mg/dL (ref 6–23)
CALCIUM: 8.5 mg/dL (ref 8.4–10.5)
CO2: 27 mEq/L (ref 19–32)
Chloride: 99 mEq/L (ref 96–112)
Creatinine, Ser: 1.13 mg/dL (ref 0.50–1.35)
GFR calc Af Amer: 80 mL/min — ABNORMAL LOW (ref >90)
GFR calc non Af Amer: 69 mL/min — ABNORMAL LOW (ref >90)
Glucose, Bld: 92 mg/dL (ref 70–99)
Potassium: 3.9 mEq/L (ref 3.7–5.3)
Sodium: 138 mEq/L (ref 137–147)
TOTAL PROTEIN: 5.7 g/dL — AB (ref 6.0–8.3)
Total Bilirubin: 0.8 mg/dL (ref 0.3–1.2)

## 2013-08-23 LAB — HEPATITIS C ANTIBODY: HCV AB: NEGATIVE

## 2013-08-23 LAB — HEPATITIS B SURFACE ANTIBODY,QUALITATIVE: Hep B S Ab: POSITIVE — AB

## 2013-08-23 LAB — HEPATITIS B SURFACE ANTIGEN: HEP B S AG: NEGATIVE

## 2013-08-23 LAB — HELICOBACTER PYLORI ABS-IGG+IGA, BLD
H Pylori IgA: 29.2 U/mL — ABNORMAL HIGH (ref <9.0)
H Pylori IgG: 6.06 {ISR} — ABNORMAL HIGH

## 2013-08-23 LAB — HEPATITIS B CORE ANTIBODY, TOTAL: HEP B C TOTAL AB: NONREACTIVE

## 2013-08-23 MED ORDER — TECHNETIUM TC 99M SESTAMIBI GENERIC - CARDIOLITE
10.0000 | Freq: Once | INTRAVENOUS | Status: AC | PRN
  Administered 2013-08-23: 10 via INTRAVENOUS

## 2013-08-23 MED ORDER — TECHNETIUM TC 99M SESTAMIBI GENERIC - CARDIOLITE
30.0000 | Freq: Once | INTRAVENOUS | Status: AC | PRN
  Administered 2013-08-23: 30 via INTRAVENOUS

## 2013-08-23 MED ORDER — REGADENOSON 0.4 MG/5ML IV SOLN
INTRAVENOUS | Status: AC
  Administered 2013-08-23: 0.4 mg via INTRAVENOUS
  Filled 2013-08-23: qty 5

## 2013-08-23 MED ORDER — VITAMIN B-12 1000 MCG PO TABS
1000.0000 ug | ORAL_TABLET | Freq: Every day | ORAL | Status: DC
  Administered 2013-08-23 – 2013-08-24 (×2): 1000 ug via ORAL
  Filled 2013-08-23 (×2): qty 1

## 2013-08-23 MED ORDER — LOSARTAN POTASSIUM 25 MG PO TABS
25.0000 mg | ORAL_TABLET | Freq: Every day | ORAL | Status: DC
  Administered 2013-08-23 – 2013-08-24 (×2): 25 mg via ORAL
  Filled 2013-08-23 (×2): qty 1

## 2013-08-23 MED ORDER — ASPIRIN EC 81 MG PO TBEC
81.0000 mg | DELAYED_RELEASE_TABLET | Freq: Every day | ORAL | Status: DC
  Administered 2013-08-23 – 2013-08-24 (×2): 81 mg via ORAL
  Filled 2013-08-23 (×2): qty 1

## 2013-08-23 NOTE — Progress Notes (Signed)
Physical Therapy Treatment Patient Details Name: Thomas [name] MRN: [id number] DOB: 1950-06-01 Today's Date: 09/06/13    History of Present Illness 23 male with a history of obesity, ETOH abuse and arthritis. Pt to ED with dizziness and weakness and found to have A-fib with RVR. Pt also with pancytopenia and seen by hematology.    PT Comments    Pt agreeable to participate in therapy.  Progressing with mobility but still limited by fatigue & weakness.  Encouraged pt to ambulate daily with Nsing.    Follow Up Recommendations  Home health PT;Supervision - Intermittent     Equipment Recommendations  Rolling walker with 5" wheels    Recommendations for Other Services       Precautions / Restrictions Restrictions Weight Bearing Restrictions: No    Mobility  Bed Mobility Overal bed mobility: Modified Independent                Transfers Overall transfer level: Needs assistance Equipment used: None Transfers: Sit to/from Stand Sit to Stand: Supervision         General transfer comment: Supervision for safety.    Ambulation/Gait Ambulation/Gait assistance: Min guard Ambulation Distance (Feet): 100 Feet Assistive device: Rolling walker (2 wheeled);None Gait Pattern/deviations: Step-through pattern;Decreased stride length;Trunk flexed Gait velocity: decr   General Gait Details: cues for tall posture, stay closer to RW.  Pt relies heavily on walker with UE's.  Distance limited by fatigue & LE weakness   Stairs            Wheelchair Mobility    Modified Rankin (Stroke Patients Only)       Balance                                    Cognition Arousal/Alertness: Awake/alert Behavior During Therapy: WFL for tasks assessed/performed Overall Cognitive Status: Within Functional Limits for tasks assessed                      Exercises General Exercises - Lower Extremity Long Arc Quad: AROM;Strengthening;Both;10 reps Hip  ABduction/ADduction: AROM;Strengthening;Both;10 reps Hip Flexion/Marching: AROM;Strengthening;Both;10 reps    General Comments        Pertinent Vitals/Pain No pain reported.     Home Living                      Prior Function            PT Goals (current goals can now be found in the care plan section) Acute Rehab PT Goals Patient Stated Goal: to get stronger PT Goal Formulation: With patient Time For Goal Achievement: 08/29/13 Potential to Achieve Goals: Good Progress towards PT goals: Progressing toward goals    Frequency  Min 3X/week    PT Plan Current plan remains appropriate    Co-evaluation             End of Session   Activity Tolerance: Patient tolerated treatment well Patient left: in bed;with call bell/phone within reach     Time: 1341-1351 PT Time Calculation (min): 10 min  Charges:  $Gait Training: 8-22 mins                    G Codes:      Sena Hitch 09/06/2013, 2:51 PM  Sarajane Marek, PTA [phone] 2013/09/06

## 2013-08-23 NOTE — Progress Notes (Deleted)
Subjective: Denies CP and SOB.   Objective: Vital signs in last 24 hours: Temp:  [98.3 F (36.8 C)-99 F (37.2 C)] 98.3 F (36.8 C) (05/06 0420) Pulse Rate:  [77-87] 83 (05/06 0902) Resp:  [18-20] 18 (05/06 0420) BP: (99-119)/(63-81) 119/81 mmHg (05/06 0902) SpO2:  [97 %-100 %] 97 % (05/06 0420) Weight:  [279 lb 8.7 oz (126.8 kg)] 279 lb 8.7 oz (126.8 kg) (05/06 0420) Last BM Date: 08/22/13  Intake/Output from previous day: 05/05 0701 - 05/06 0700 In: 516.7 [P.O.:480; I.V.:36.7] Out: 2100 [Urine:2100] Intake/Output this shift:    Medications Current Facility-Administered Medications  Medication Dose Route Frequency Provider Last Rate Last Dose  . 0.9 %  sodium chloride infusion   Intravenous Continuous Juanita Craver, MD 20 mL/hr at 08/22/13 0800    . 0.9 %  sodium chloride infusion   Intravenous Continuous Vena Rua, PA-C 20 mL/hr at 08/21/13 1750    . carvedilol (COREG) tablet 6.25 mg  6.25 mg Oral BID WC Thayer Headings, MD   6.25 mg at 08/22/13 1739  . digoxin (LANOXIN) tablet 0.125 mg  0.125 mg Oral Daily Sueanne Margarita, MD   0.125 mg at 08/22/13 1037  . folic acid (FOLVITE) tablet 5 mg  5 mg Oral Daily Padma Kamineni, MD   5 mg at 08/22/13 1037  . furosemide (LASIX) injection 40 mg  40 mg Intravenous BID Thayer Headings, MD   40 mg at 08/22/13 1739  . HYDROmorphone (DILAUDID) injection 0.25-0.5 mg  0.25-0.5 mg Intravenous Q5 min PRN Duane Boston, MD      . lactated ringers infusion   Intravenous Continuous Jerene Bears, MD 50 mL/hr at 08/21/13 1131 1,000 mL at 08/21/13 1131  . magnesium sulfate IVPB 2 g 50 mL  2 g Intravenous Once Alwyn Pea, MD      . multivitamin with minerals tablet 1 tablet  1 tablet Oral Daily Tarri Fuller, PA-C   1 tablet at 08/22/13 1037  . pantoprazole (PROTONIX) EC tablet 40 mg  40 mg Oral Q0600 Erlene Quan, PA-C   40 mg at 08/22/13 6712  . potassium chloride SA (K-DUR,KLOR-CON) CR tablet 40 mEq  40 mEq Oral TID Tarri Fuller, PA-C    40 mEq at 08/22/13 2351  . promethazine (PHENERGAN) injection 6.25-12.5 mg  6.25-12.5 mg Intravenous Q15 min PRN Duane Boston, MD      . thiamine (VITAMIN B-1) tablet 100 mg  100 mg Oral Daily Tarri Fuller, PA-C   100 mg at 08/22/13 1037   Or  . thiamine (B-1) injection 100 mg  100 mg Intravenous Daily Tarri Fuller, PA-C        PE: General appearance: alert, cooperative and no distress Lungs: clear to auscultation bilaterally Heart: irregularly irregular rhythm and No MM Abdomen: +BS nontender Extremities: 1+ bilateral LEE Pulses: 2+ and symmetric Skin: Warm and dry Neurologic: Grossly normal  Lab Results:   Recent Labs  08/21/13 0530 08/22/13 0418 08/23/13 0255  WBC 3.5* 4.0 3.8*  HGB 10.0* 10.2* 10.2*  HCT 28.5* 29.6* 29.4*  PLT 66* 73* 88*   BMET  Recent Labs  08/21/13 0530 08/22/13 0418 08/23/13 0255  NA 135* 140 138  K 3.4* 3.9 3.9  CL 95* 98 99  CO2 28 27 27   GLUCOSE 91 89 92  BUN 10 9 9   CREATININE 0.96 1.07 1.13  CALCIUM 8.1* 8.6 8.5     Assessment/Plan  1.  Atrial fibrillation with RVR:  Still in afib. Ventricular rate is controlled with resting HR in the 70s.          -  Continue Coreg and digoxin.  2.  Chronic systolic CHF:  His EF is 65% by echo.  Likely due to his ETOH abuse., has mod. MR.  -  Continue Coreg  - ? Adding a low dose ACE-I, if BP allows. Renal function is normal.  - Lexiscan NST completed today. No ischemic changes noted on EKG. Full interpretation to follow. If abnormal,   he will require a LHC for definitive assessment of coronaries.   3.  Pancytopenia:  With elevated MCV.   Partially from ETOH use.   GI did not find any specific source of bleeding.    4.  Alcoholic liver disease:  Elevated AST. Evidence of cirrhosis on CT . Amiodarone has been discontinue. No statins.    5.   Hypokalemia:  Resolved after repletion. K today is 3.9. Continue 40 mEq of Potassium TID.   MD to follow with further recommendations.   Lyda Jester, PA-C 08/23/2013 9:21

## 2013-08-23 NOTE — Progress Notes (Signed)
Clinical Social Work Department BRIEF PSYCHOSOCIAL ASSESSMENT 08/23/2013  Patient:  Thomas [name],Thomas [name]     Account Number:  [id number]     Admit date:  08/17/2013  Clinical Social Worker:  Megan Salon  Date/Time:  08/23/2013 01:50 PM  Referred by:  Physician  Date Referred:  08/23/2013 Referred for  Substance Abuse   Other Referral:   Interview type:  Patient Other interview type:    PSYCHOSOCIAL DATA Living Status:  FAMILY Admitted from facility:   Level of care:   Primary support name:  Thomas [name] Primary support relationship to patient:  PARENT Degree of support available:   Adequate    CURRENT CONCERNS Current Concerns  Substance Abuse   Other Concerns:    SOCIAL WORK ASSESSMENT / PLAN CSW recieved consult by MD to  speak with patient regarding current ETOH use. CSW met with patient, introduced self and explained reason for visit. Patient states, "I can stop on my own." CSW attempted to start SBIRT and assess patient for current ETOH. Patient stated he does not want social worker assistance and again states he is going to stop on his own and does not wish to speak to the social worker at this time. CSW explained that social worker will be available if patient changes their mind.   Assessment/plan status:  No Further Intervention Required Other assessment/ plan:   Information/referral to community resources:   Patient refused services    PATIENT'S/FAMILY'S RESPONSE TO PLAN OF CARE: Patient states he is going to stop on his own and does not wish to speak to Education officer, museum. CSW signing off at this time. CSW will be available as needed.        Jeanette Caprice, MSW, [place]

## 2013-08-23 NOTE — Progress Notes (Signed)
Subjective: No SOB, CP.  Legs feel weak when he walks  Objective: Vital signs in last 24 hours: Temp:  [98.3 F (36.8 C)-99 F (37.2 C)] 98.3 F (36.8 C) (05/06 0420) Pulse Rate:  [77-87] 83 (05/06 0902) Resp:  [18-20] 18 (05/06 0420) BP: (99-119)/(63-81) 119/81 mmHg (05/06 0902) SpO2:  [97 %-100 %] 97 % (05/06 0420) Weight:  [279 lb 8.7 oz (126.8 kg)] 279 lb 8.7 oz (126.8 kg) (05/06 0420) Last BM Date: 08/23/13  Intake/Output from previous day: 05/05 0701 - 05/06 0700 In: 516.7 [P.O.:480; I.V.:36.7] Out: 2100 [Urine:2100] Intake/Output this shift: Total I/O In: -  Out: 175 [Urine:175]  Medications Current Facility-Administered Medications  Medication Dose Route Frequency Provider Last Rate Last Dose  . 0.9 %  sodium chloride infusion   Intravenous Continuous Juanita Craver, MD 20 mL/hr at 08/22/13 0800    . 0.9 %  sodium chloride infusion   Intravenous Continuous Vena Rua, PA-C 20 mL/hr at 08/21/13 1750    . carvedilol (COREG) tablet 6.25 mg  6.25 mg Oral BID WC Thayer Headings, MD   6.25 mg at 08/23/13 1025  . digoxin (LANOXIN) tablet 0.125 mg  0.125 mg Oral Daily Sueanne Margarita, MD   0.125 mg at 03/47/42 5956  . folic acid (FOLVITE) tablet 5 mg  5 mg Oral Daily Padma Kamineni, MD   5 mg at 08/23/13 1025  . furosemide (LASIX) injection 40 mg  40 mg Intravenous BID Thayer Headings, MD   40 mg at 08/23/13 1024  . HYDROmorphone (DILAUDID) injection 0.25-0.5 mg  0.25-0.5 mg Intravenous Q5 min PRN Duane Boston, MD      . lactated ringers infusion   Intravenous Continuous Jerene Bears, MD 50 mL/hr at 08/21/13 1131 1,000 mL at 08/21/13 1131  . magnesium sulfate IVPB 2 g 50 mL  2 g Intravenous Once Alwyn Pea, MD      . multivitamin with minerals tablet 1 tablet  1 tablet Oral Daily Tarri Fuller, PA-C   1 tablet at 08/23/13 1024  . pantoprazole (PROTONIX) EC tablet 40 mg  40 mg Oral Q0600 Erlene Quan, PA-C   40 mg at 08/22/13 3875  . potassium chloride SA  (K-DUR,KLOR-CON) CR tablet 40 mEq  40 mEq Oral TID Tarri Fuller, PA-C   40 mEq at 08/23/13 1024  . promethazine (PHENERGAN) injection 6.25-12.5 mg  6.25-12.5 mg Intravenous Q15 min PRN Duane Boston, MD      . thiamine (VITAMIN B-1) tablet 100 mg  100 mg Oral Daily Tarri Fuller, PA-C   100 mg at 08/23/13 1024   Or  . thiamine (B-1) injection 100 mg  100 mg Intravenous Daily Tarri Fuller, PA-C      . vitamin B-12 (CYANOCOBALAMIN) tablet 1,000 mcg  1,000 mcg Oral Daily Geradine Girt, DO        PE: General appearance: alert, cooperative and no distress Lungs: clear to auscultation bilaterally Heart: irregularly irregular rhythm and No MM Abdomen: +BS nontender Extremities: 1 left LEE Pulses: 2+ and symmetric Skin: Warm and dry Neurologic: Grossly normal  Lab Results:   Recent Labs  08/21/13 0530 08/22/13 0418 08/23/13 0255  WBC 3.5* 4.0 3.8*  HGB 10.0* 10.2* 10.2*  HCT 28.5* 29.6* 29.4*  PLT 66* 73* 88*   BMET  Recent Labs  08/21/13 0530 08/22/13 0418 08/23/13 0255  NA 135* 140 138  K 3.4* 3.9 3.9  CL 95* 98 99  CO2 28 27 27   GLUCOSE  91 89 92  BUN 10 9 9   CREATININE 0.96 1.07 1.13  CALCIUM 8.1* 8.6 8.5     Assessment/Plan    1.  Atrial fibrillation with RVR  Still in afib.  Improved.  Rate = 86 - 110 , this will be difficult to effectively treat.  Long hx of alcoholism.  Poor amio candidate due to his liver disease.  Not a candidate for flecainide, propafanone, because of systolic chf.  I suspect he is a poor candidate for coumadin therapy.    continue dig. Continue asa  2. Chronic systolic CHF:  His EF is 16% by echo.  Likely due to his ETOH abuse., has mod. MR Starting coreg.  Will DC dilt drip, increase lasix IV  myoview study does not show any ischemia ( by my viewing via computer on the floor, official reading to be done later today) .  Continue coreg. Will start Losartan 25 mg a day.  3.  Pancytopenia:  With elevated MCV.   Partially from ETOH use.   GI  did not find any specific source of bleeding.   4. Alcoholic liver disease:  Elevated AST.  , evidence of cirrhosis on CT    5.    Hypokalemia  Continue replacement.  Change to TID  Ramond Dial., MD, [facility] 08/23/2013, 11:34 AM Office - [phone] Pager [place]

## 2013-08-23 NOTE — Progress Notes (Addendum)
PROGRESS NOTE  Thomas [name] LNL:[id number] DOB: [birth year] DOA: 08/17/2013 PCP: No PCP Per Patient  Assessment/Plan: Afib w/ RVR  Defer to Cardiology service stress test today- await results- if positive, will need cath -coreg, dig  B12- lower end of normal will replace PO  Pancytopenia related to ETOH abuse  Heme-Onc following abd US- shows no splenomegaly Blood counts appear stable thus far Anticoagulation started per Cardiology - monitor closely for bleeding  Ultrasound of neck revealed left supra clavicular lymphadenopathy. No lymphadenopathy by CT size criteria    ETOH abuse  Outside window for DT's Monitor for now Social work for outpatient treatment -spoke at length with patient and he plans to stop drinking so "I can live"  Alcoholic liver disease  GI following LFT's appear stable GB walls appear mildly thickened, neg for Murphy's sign - fatty liver on [place]  Anemia w/ heme pos stools  Pt is s/p EGD and colon on 5/4 Hypertensive gastropathy and gastritis seen with duodenal inflammation on EGD Recs to f/u h.pylori and if pos, would treat  Needs PCP and home health Rolling walker   Code Status: full Family Communication: patient Disposition Plan:    Consultants:  cards  Procedures:  Stress test    HPI/Subjective: Plans to stop drinking  Objective: Filed Vitals:   08/23/13 0902  BP: 119/81  Pulse: 83  Temp:   Resp:     Intake/Output Summary (Last 24 hours) at 08/23/13 1101 Last data filed at 08/23/13 4081  Gross per 24 hour  Intake    240 ml  Output   1900 ml  Net  -1660 ml   Filed Weights   08/21/13 0449 08/22/13 0500 08/23/13 0420  Weight: 131.09 kg (289 lb) 131.9 kg (290 lb 12.6 oz) 126.8 kg (279 lb 8.7 oz)    Exam:   General:  A+Ox3, NAD  Cardiovascular: rrr  Respiratory: clear  Abdomen: +Bs, soft  Musculoskeletal: moves all 4 ext  Data Reviewed: Basic Metabolic Panel:  Recent Labs Lab 08/17/13 1340  08/17/13 2108  08/19/13 0420 08/20/13 0506 08/21/13 0530 08/22/13 0418 08/23/13 0255  NA 133*  --   < > 134* 135* 135* 140 138  K 3.6*  --   < > 3.5* 3.3* 3.4* 3.9 3.9  CL 87*  --   < > 91* 93* 95* 98 99  CO2 24  --   < > 28 30 28 27 27   GLUCOSE 168*  --   < > 100* 89 91 89 92  BUN 23  --   < > 18 12 10 9 9   CREATININE 0.70  --   < > 0.84 0.84 0.96 1.07 1.13  CALCIUM 8.8  --   < > 8.6 8.1* 8.1* 8.6 8.5  MG  --  1.5  --  1.6 1.5  --   --   --   < > = values in this interval not displayed. Liver Function Tests:  Recent Labs Lab 08/17/13 1340 08/19/13 0420 08/23/13 0255  AST 93* 168* 63*  ALT 28 34 29  ALKPHOS 64 59 54  BILITOT 1.8* 1.1 0.8  PROT 6.8 6.2 5.7*  ALBUMIN 3.4* 3.2* 2.8*   No results found for this basename: LIPASE, AMYLASE,  in the last 168 hours No results found for this basename: AMMONIA,  in the last 168 hours CBC:  Recent Labs Lab 08/19/13 0420 08/20/13 0506 08/21/13 0530 08/22/13 0418 08/23/13 0255  WBC 3.0* 2.9* 3.5* 4.0 3.8*  NEUTROABS 1.4* 1.2*  1.6* 2.2 2.1  HGB 11.3* 10.2* 10.0* 10.2* 10.2*  HCT 32.4* 29.0* 28.5* 29.6* 29.4*  MCV 101.3* 100.7* 101.4* 101.7* 102.1*  PLT 82* 60* 66* 73* 88*   Cardiac Enzymes: No results found for this basename: CKTOTAL, CKMB, CKMBINDEX, TROPONINI,  in the last 168 hours BNP (last 3 results)  Recent Labs  08/17/13 1346  PROBNP 1234.0*   CBG:  Recent Labs Lab 08/21/13 1141 08/22/13 0613 08/22/13 1156 08/22/13 1615 08/22/13 2140  GLUCAP 85 79 108* 88 107*    No results found for this or any previous visit (from the past 240 hour(s)).   Studies: Ct Soft Tissue Neck W Contrast  08/22/2013   CLINICAL DATA:  Lymphadenopathy.  EXAM: CT NECK WITH CONTRAST  TECHNIQUE: Multidetector CT imaging of the neck was performed using the standard protocol following the bolus administration of intravenous contrast.  CONTRAST:  1105mL OMNIPAQUE IOHEXOL 300 MG/ML  SOLN  COMPARISON:  US SOFT TISSUE HEAD/NECK dated  08/21/2013; CT ANGIO CHEST W/CM &/OR WO/CM dated 08/18/2013  FINDINGS: Prominent venous structures in the supraclavicular fossa without discrete nodal conglomeration. 7 mm round left level 4 versus 5b lymph node, in addition to smaller level 5 lymph nodes (coronal 58/101). No lymphadenopathy by CT size criteria.  Aerodigestive tract is nonsuspicious, retropharyngeal course of the carotid arteries mildly deforms the posterior wall of the pharynx. Mild calcific atherosclerosis of the carotid bulbs. No free fluid or focal without collections of the neck. Normal appearance of the major salivary glands and thyroid gland though, evaluation somewhat limited by shoulder attenuation.  Strain cervical lordosis with moderate degenerative disc disease. Mild levoscoliosis. Small right maxillary mucosal retention cyst with mucosal thickening. Mastoid air cells are well aerated.  IMPRESSION: No lymphadenopathy by CT size criteria; 7 mm left level 4 versus 5b round lymph node.   Electronically Signed   By: Elon Alas   On: 08/22/2013 04:05   US Soft Tissue Head/neck  08/21/2013   CLINICAL DATA:  Adenopathy suspected on physical exam  EXAM: ULTRASOUND OF HEAD/NECK SOFT TISSUES  TECHNIQUE: Ultrasound examination of the head and neck soft tissues was performed in the area of clinical concern.  COMPARISON:  None.  FINDINGS: Two left supraclavicular lymph nodes are identified both with characteristic ultrasound morphology, both 6 mm short axis diameter. No adenopathy, mass, cyst, or other lesion identified. Survey images of the right supraclavicular region unremarkable.  IMPRESSION: Two normal-sized left supraclavicular lymph nodes are identified.   Electronically Signed   By: Arne Cleveland M.D.   On: 08/21/2013 16:21   US Abdomen Complete  08/22/2013   CLINICAL DATA:  Pancytopenia  EXAM: ULTRASOUND ABDOMEN COMPLETE  COMPARISON:  None.  FINDINGS: Gallbladder:  Mild gallbladder distention. No wall thickening or stones. Negative  sonographic Murphy's.  Common bile duct:  Diameter: Normal caliber, 4 mm  Liver:  Increased/heterogeneous echotexture suggesting fatty infiltration or intrinsic liver disease. No biliary ductal dilatation or focal abnormality.  IVC:  No abnormality visualized.  Pancreas:  Visualized portion unremarkable.  Spleen:  Normal in craniocaudal length at 6.7 cm.  No focal abnormality.  Right Kidney:  Length: 12.6. Echogenicity within normal limits. No mass or hydronephrosis visualized.  Left Kidney:  Length: 13.3. Normal echogenicity. No hydronephrosis. 5 mm echogenic focus within the lower pole may represent a small angiomyolipoma.  Abdominal aorta:  No aneurysm visualized.  Other findings:  None.  IMPRESSION: Gallbladder appears mildly distended, but there is no wall thickening or sonographic Murphy's sign. Negative for stones.  Suspect small angiomyolipoma in the lower pole of the left kidney.  Increased echotexture throughout the liver compatible with fatty infiltration or intrinsic liver disease.  No evidence of splenomegaly.  No acute findings.   Electronically Signed   By: Rolm Baptise M.D.   On: 08/22/2013 10:23    Scheduled Meds: . carvedilol  6.25 mg Oral BID WC  . digoxin  0.125 mg Oral Daily  . folic acid  5 mg Oral Daily  . furosemide  40 mg Intravenous BID  . magnesium sulfate 1 - 4 g bolus IVPB  2 g Intravenous Once  . multivitamin with minerals  1 tablet Oral Daily  . pantoprazole  40 mg Oral Q0600  . potassium chloride  40 mEq Oral TID  . thiamine  100 mg Oral Daily   Or  . thiamine  100 mg Intravenous Daily  . vitamin B-12  1,000 mcg Oral Daily   Continuous Infusions: . sodium chloride 20 mL/hr at 08/22/13 0800  . sodium chloride 20 mL/hr at 08/21/13 1750  . lactated ringers 1,000 mL (08/21/13 1131)   Antibiotics Given (last 72 hours)   None      Principal Problem:   Atrial fibrillation with RVR Active Problems:   SOB (shortness of breath)   Pancytopenia   ETOH abuse    Hypokalemia   Edema of left lower extremity   Elevated LFTs   Positive D dimer   Alcoholic liver disease   Heme + stool   DCM (dilated cardiomyopathy)   Acute systolic CHF (congestive heart failure)   Diverticulosis of colon without hemorrhage   Benign neoplasm of colon   Portal hypertensive gastropathy   Unspecified gastritis and gastroduodenitis without mention of hemorrhage    Time spent: 35 min    [facility] Pager [phone]. If 7PM-7AM, please contact night-coverage at [email or web address], password [facility] 08/23/2013, 11:01 AM  LOS: 6 days

## 2013-08-24 LAB — CBC WITH DIFFERENTIAL/PLATELET
BASOS ABS: 0 10*3/uL (ref 0.0–0.1)
Basophils Relative: 1 % (ref 0–1)
EOS ABS: 0.1 10*3/uL (ref 0.0–0.7)
Eosinophils Relative: 2 % (ref 0–5)
HCT: 29.9 % — ABNORMAL LOW (ref 39.0–52.0)
Hemoglobin: 10.3 g/dL — ABNORMAL LOW (ref 13.0–17.0)
LYMPHS PCT: 23 % (ref 12–46)
Lymphs Abs: 0.9 10*3/uL (ref 0.7–4.0)
MCH: 35.9 pg — AB (ref 26.0–34.0)
MCHC: 34.4 g/dL (ref 30.0–36.0)
MCV: 104.2 fL — ABNORMAL HIGH (ref 78.0–100.0)
MONO ABS: 0.9 10*3/uL (ref 0.1–1.0)
Monocytes Relative: 23 % — ABNORMAL HIGH (ref 3–12)
Neutro Abs: 2.2 10*3/uL (ref 1.7–7.7)
Neutrophils Relative %: 51 % (ref 43–77)
PLATELETS: 119 10*3/uL — AB (ref 150–400)
RBC: 2.87 MIL/uL — ABNORMAL LOW (ref 4.22–5.81)
RDW: 15.4 % (ref 11.5–15.5)
WBC: 4.1 10*3/uL (ref 4.0–10.5)

## 2013-08-24 LAB — BASIC METABOLIC PANEL
BUN: 10 mg/dL (ref 6–23)
CO2: 24 mEq/L (ref 19–32)
CREATININE: 1.21 mg/dL (ref 0.50–1.35)
Calcium: 8.8 mg/dL (ref 8.4–10.5)
Chloride: 100 mEq/L (ref 96–112)
GFR calc Af Amer: 73 mL/min — ABNORMAL LOW (ref >90)
GFR, EST NON AFRICAN AMERICAN: 63 mL/min — AB (ref >90)
Glucose, Bld: 92 mg/dL (ref 70–99)
Potassium: 4.3 mEq/L (ref 3.7–5.3)
SODIUM: 137 meq/L (ref 137–147)

## 2013-08-24 LAB — RETICULOCYTES
RBC.: 2.87 MIL/uL — ABNORMAL LOW (ref 4.22–5.81)
RETIC CT PCT: 4.6 % — AB (ref 0.4–3.1)
Retic Count, Absolute: 132 10*3/uL (ref 19.0–186.0)

## 2013-08-24 LAB — MAGNESIUM: MAGNESIUM: 1.6 mg/dL (ref 1.5–2.5)

## 2013-08-24 MED ORDER — DIGOXIN 125 MCG PO TABS: 0.1250 mg | ORAL_TABLET | Freq: Every day | ORAL | Status: DC

## 2013-08-24 MED ORDER — POTASSIUM CHLORIDE CRYS ER 20 MEQ PO TBCR: 40.0000 meq | EXTENDED_RELEASE_TABLET | Freq: Two times a day (BID) | ORAL | Status: DC

## 2013-08-24 MED ORDER — ASPIRIN 81 MG PO TBEC: 81.0000 mg | DELAYED_RELEASE_TABLET | Freq: Every day | ORAL | Status: DC

## 2013-08-24 MED ORDER — BIS SUBCIT-METRONID-TETRACYC 140-125-125 MG PO CAPS: 3.0000 | ORAL_CAPSULE | Freq: Three times a day (TID) | ORAL | Status: DC

## 2013-08-24 MED ORDER — PANTOPRAZOLE SODIUM 40 MG PO TBEC: 40.0000 mg | DELAYED_RELEASE_TABLET | Freq: Two times a day (BID) | ORAL | Status: DC

## 2013-08-24 MED ORDER — FOLIC ACID 1 MG PO TABS: 5.0000 mg | ORAL_TABLET | Freq: Every day | ORAL | Status: DC

## 2013-08-24 MED ORDER — ADULT MULTIVITAMIN W/MINERALS CH: 1.0000 | ORAL_TABLET | Freq: Every day | ORAL | Status: AC

## 2013-08-24 MED ORDER — FUROSEMIDE 40 MG PO TABS
40.0000 mg | ORAL_TABLET | Freq: Two times a day (BID) | ORAL | Status: DC
  Filled 2013-08-24: qty 1

## 2013-08-24 MED ORDER — LOSARTAN POTASSIUM 25 MG PO TABS: 25.0000 mg | ORAL_TABLET | Freq: Every day | ORAL | Status: DC

## 2013-08-24 MED ORDER — CARVEDILOL 6.25 MG PO TABS: 6.2500 mg | ORAL_TABLET | Freq: Two times a day (BID) | ORAL | Status: DC

## 2013-08-24 MED ORDER — THIAMINE HCL 100 MG PO TABS: 100.0000 mg | ORAL_TABLET | Freq: Every day | ORAL | Status: DC

## 2013-08-24 MED ORDER — MAGNESIUM SULFATE 40 MG/ML IJ SOLN
2.0000 g | Freq: Once | INTRAMUSCULAR | Status: AC
  Administered 2013-08-24: 2 g via INTRAVENOUS
  Filled 2013-08-24: qty 50

## 2013-08-24 MED ORDER — FUROSEMIDE 40 MG PO TABS: 40.0000 mg | ORAL_TABLET | Freq: Two times a day (BID) | ORAL | Status: DC

## 2013-08-24 MED ORDER — CYANOCOBALAMIN 1000 MCG PO TABS: 1000.0000 ug | ORAL_TABLET | Freq: Every day | ORAL | Status: DC

## 2013-08-24 MED ORDER — PANTOPRAZOLE SODIUM 40 MG PO TBEC: 40.0000 mg | DELAYED_RELEASE_TABLET | Freq: Every day | ORAL | Status: DC

## 2013-08-24 NOTE — Progress Notes (Signed)
DC IV and tele per order and protocol; DC instructions reviewed and signed by patient; pt is apprehensive in picking up medications-states he does not have the financial means; pt has insurance; talked to case manager;with pt having insurance no further assistance can be given; stressed to pt the importance of taking these medications and following up with his MD appointment.  Carollee Sires, RN

## 2013-08-24 NOTE — Progress Notes (Signed)
Subjective: No complaints  Objective: Vital signs in last 24 hours: Temp:  [98.7 F (37.1 C)-99.2 F (37.3 C)] 98.9 F (37.2 C) (05/07 0438) Pulse Rate:  [78-85] 85 (05/07 0438) Resp:  [17-18] 18 (05/07 0438) BP: (84-104)/(50-74) 104/67 mmHg (05/07 0438) SpO2:  [97 %-99 %] 97 % (05/07 0438) Weight:  [278 lb 3.5 oz (126.2 kg)] 278 lb 3.5 oz (126.2 kg) (05/07 0438) Last BM Date: 08/24/13  Intake/Output from previous day: 05/06 0701 - 05/07 0700 In: 63 [P.O.:810] Out: 1575 [Urine:1575] Intake/Output this shift: Total I/O In: 120 [P.O.:120] Out: -   Medications Current Facility-Administered Medications  Medication Dose Route Frequency Provider Last Rate Last Dose  . 0.9 %  sodium chloride infusion   Intravenous Continuous Juanita Craver, MD 20 mL/hr at 08/22/13 0800    . 0.9 %  sodium chloride infusion   Intravenous Continuous Vena Rua, PA-C 20 mL/hr at 08/21/13 1750    . aspirin EC tablet 81 mg  81 mg Oral Daily Geradine Girt, DO   81 mg at 08/23/13 1226  . carvedilol (COREG) tablet 6.25 mg  6.25 mg Oral BID WC Thayer Headings, MD   6.25 mg at 08/24/13 0842  . digoxin (LANOXIN) tablet 0.125 mg  0.125 mg Oral Daily Sueanne Margarita, MD   0.125 mg at 89/21/19 4174  . folic acid (FOLVITE) tablet 5 mg  5 mg Oral Daily Padma Kamineni, MD   5 mg at 08/23/13 1025  . furosemide (LASIX) injection 40 mg  40 mg Intravenous BID Thayer Headings, MD   40 mg at 08/24/13 0814  . HYDROmorphone (DILAUDID) injection 0.25-0.5 mg  0.25-0.5 mg Intravenous Q5 min PRN Duane Boston, MD      . losartan (COZAAR) tablet 25 mg  25 mg Oral Daily Thayer Headings, MD   25 mg at 08/23/13 1226  . magnesium sulfate IVPB 2 g 50 mL  2 g Intravenous Once Alwyn Pea, MD      . magnesium sulfate IVPB 2 g 50 mL  2 g Intravenous Once Jessica U Vann, DO      . multivitamin with minerals tablet 1 tablet  1 tablet Oral Daily Tarri Fuller, PA-C   1 tablet at 08/23/13 1024  . pantoprazole (PROTONIX) EC tablet  40 mg  40 mg Oral Q0600 Erlene Quan, PA-C   40 mg at 08/24/13 4818  . potassium chloride SA (K-DUR,KLOR-CON) CR tablet 40 mEq  40 mEq Oral TID Tarri Fuller, PA-C   40 mEq at 08/23/13 2213  . promethazine (PHENERGAN) injection 6.25-12.5 mg  6.25-12.5 mg Intravenous Q15 min PRN Duane Boston, MD      . thiamine (VITAMIN B-1) tablet 100 mg  100 mg Oral Daily Tarri Fuller, PA-C   100 mg at 08/23/13 1024  . vitamin B-12 (CYANOCOBALAMIN) tablet 1,000 mcg  1,000 mcg Oral Daily Geradine Girt, DO   1,000 mcg at 08/23/13 1226    PE: General appearance: alert, cooperative and no distress Lungs: clear to auscultation bilaterally Heart: irregularly irregular rhythm and No MM Extremities: 1+ left pedal edema.  Trace on the right. Pulses: 2+ and symmetric Skin: Warm and dry Neurologic: Grossly normal  Lab Results:   Recent Labs  08/22/13 0418 08/23/13 0255 08/24/13 0520  WBC 4.0 3.8* 4.1  HGB 10.2* 10.2* 10.3*  HCT 29.6* 29.4* 29.9*  PLT 73* 88* 119*   BMET  Recent Labs  08/22/13 0418 08/23/13 0255 08/24/13 0520  NA  140 138 137  K 3.9 3.9 4.3  CL 98 99 100  CO2 27 27 24   GLUCOSE 89 92 92  BUN 9 9 10   CREATININE 1.07 1.13 1.21  CALCIUM 8.6 8.5 8.8   PT/INR No results found for this basename: LABPROT, INR,  in the last 72 hours Cholesterol No results found for this basename: CHOL,  in the last 72 hours Cardiac Enzymes No components found with this basename: TROPONIN,  CKMB,   Studies/Results:   Assessment/Plan  1. Atrial fibrillation with RVR  Still in afib. Improved. Rate <100 , this will be difficult to effectively treat. Long hx of alcoholism. Poor amio candidate due to his liver disease. Not a candidate for flecainide, propafanone, because of systolic chf.  Poor candidate for coumadin therapy.  continue dig. Coreg  Continue asa  Telemetry was discontinued for some reason. Rate on exam controlled.    2. Chronic systolic CHF: His EF is 14% by echo. Likely due to his  ETOH abuse., has mod. MR  myoview study does not show any ischemia ( by my viewing via computer on the floor, official reading to be done later today) .  Continue coreg 6.25mg  BID Losartan 25 mg a day.  No Orthopnea/PND.  Mild LEE.  Lungs are clear.   Cr is trending up.  Will change to PO lasix 40mg  BID. He received the AM IV dose.  Will hold the PM dose and start tomorrow.  BP is soft.    3. Pancytopenia: With elevated MCV.  Partially from ETOH use. GI did not find any specific source of bleeding.   4. Alcoholic liver disease: Elevated AST. , evidence of cirrhosis on CT   5. Hypokalemia  Continue replacement.   Stable on current therapy  Follow up appt arranged with Dr. Radford Pax.   LOS: 7 days   Attending Note:   The patient was seen and examined.  Agree with assessment and plan as noted above.  Changes made to the above note as needed.  His HR and BP was well controlled today. No additional cardiac recs.   He needs to be place with a primary doctor.   He can follow up with Dr. Radford Pax if he needs cardiology follow up.    Thayer Headings, Brooke Bonito., MD, [facility] 08/24/2013, 11:11 AM   Tarri Fuller PA-C 08/24/2013 9:04 AM \

## 2013-08-24 NOTE — Discharge Summary (Signed)
Physician Discharge Summary  Thomas [name] [id number] DOB: 09/20/1950 DOA: 08/17/2013  PCP: No PCP Per Patient  Admit date: 08/17/2013 Discharge date: 08/24/2013  Time spent: 35 minutes  Recommendations for Outpatient Follow-up:  1. BMP 1 week 2. Outpatient alcohol treatment if patient becomes interested  Discharge Diagnoses:  Principal Problem:   Atrial fibrillation with RVR Active Problems:   SOB (shortness of breath)   Pancytopenia   ETOH abuse   Hypokalemia   Edema of left lower extremity   Elevated LFTs   Positive D dimer   Alcoholic liver disease   Heme + stool   DCM (dilated cardiomyopathy)   Acute systolic CHF (congestive heart failure)   Diverticulosis of colon without hemorrhage   Benign neoplasm of colon   Portal hypertensive gastropathy   Unspecified gastritis and gastroduodenitis without mention of hemorrhage   Discharge Condition: improved  Diet recommendation: cardiac  Filed Weights   08/22/13 0500 08/23/13 0420 08/24/13 0438  Weight: 131.9 kg (290 lb 12.6 oz) 126.8 kg (279 lb 8.7 oz) 126.2 kg (278 lb 3.5 oz)    History of present illness:  The patient is a 61 male with a history of obesity, ETOH abuse and arthritis. He reports feeling dizzy and wobbly in the legs for the last two days, 2 weeks of LEE a lot worse in the left leg and some dark tarry stools for two weeks. He reports a decreased appetite as well. He takes no medications and has not seen a PCP in a long time. The patient currently denies nausea, vomiting, fever, chest pain, shortness of breath, orthopnea, dizziness, PND, cough, congestion, abdominal pain, hematochezia, melena, lower extremity edema, claudication.   Hospital Course:  Atrial fibrillation with RVR  Still in afib. Improved. Rate <100 , this will be difficult to effectively treat. Long hx of alcoholism. Poor amio candidate due to his liver disease. Not a candidate for flecainide, propafanone, because of systolic chf. Poor  candidate for coumadin therapy unless he consistently follows up and stops alcohol continue dig. Coreg  Continue asa  Telemetry was discontinued for some reason. Rate on exam controlled.   Chronic systolic CHF: His EF is [id number] by echo. Likely due to his ETOH abuse., has mod. MR  myoview study does not show any ischemia Continue coreg 6.25mg  BID  Losartan 25 mg a day.  No Orthopnea/PND. Mild LEE. Lungs are clear.  PO lasix 40mg  BID.   Pancytopenia: With elevated MCV.  Partially from ETOH use. GI did not find any specific source of bleeding  Alcoholic liver disease: Elevated AST. , evidence of cirrhosis on CT    Hypokalemia  Continue replacement. Stable on current therapy  Patient's H. pylori antibody was positive in the setting of recently documented gastroduodenitis  He is to be treated with triple therapy on discharge.   Pylera to be taken for 10 days, which includes antibiotics and twice daily PPI    Procedures:  EGD  Stress test  Consultations:  GI  Heme onc  cards  Discharge Exam: Filed Vitals:   08/24/13 0900  BP:   Pulse: 78  Temp:   Resp:     General: A+Ox3, NAD Cardiovascular: irr Respiratory: clear, no wheezing  Discharge Instructions You were cared for by a hospitalist during your hospital stay. If you have any questions about your discharge medications or the care you received while you were in the hospital after you are discharged, you can call the unit and asked to speak with the hospitalist on  call if the hospitalist that took care of you is not available. Once you are discharged, your primary care physician will handle any further medical issues. Please note that NO REFILLS for any discharge medications will be authorized once you are discharged, as it is imperative that you return to your primary care physician (or establish a relationship with a primary care physician if you do not have one) for your aftercare needs so that they can reassess your  need for medications and monitor your lab values.  Discharge Orders   Future Appointments Provider Department Dept Phone   09/04/2013 8:15 AM Sueanne Margarita, MD [facility] [phone]   10/18/2013 9:00 AM Gatha Mayer, MD [facility] [phone]   Future Orders Complete By Expires   Diet - low sodium heart healthy  As directed    Discharge instructions  As directed    Increase activity slowly  As directed        Medication List    STOP taking these medications       naproxen sodium 220 MG tablet  Commonly known as:  ANAPROX      TAKE these medications       aspirin 81 MG EC tablet  Take 1 tablet (81 mg total) by mouth daily.     bismuth-metronidazole-tetracycline 140-125-125 MG per capsule  Commonly known as:  PYLERA  Take 3 capsules by mouth 4 (four) times daily -  before meals and at bedtime.     carvedilol 6.25 MG tablet  Commonly known as:  COREG  Take 1 tablet (6.25 mg total) by mouth 2 (two) times daily with a meal.     cyanocobalamin 1000 MCG tablet  Take 1 tablet (1,000 mcg total) by mouth daily.     digoxin 0.125 MG tablet  Commonly known as:  LANOXIN  Take 1 tablet (0.125 mg total) by mouth daily.     folic acid 1 MG tablet  Commonly known as:  FOLVITE  Take 5 tablets (5 mg total) by mouth daily.     furosemide 40 MG tablet  Commonly known as:  LASIX  Take 1 tablet (40 mg total) by mouth 2 (two) times daily.  Start taking on:  08/25/2013     losartan 25 MG tablet  Commonly known as:  COZAAR  Take 1 tablet (25 mg total) by mouth daily.     multivitamin with minerals Tabs tablet  Take 1 tablet by mouth daily.     pantoprazole 40 MG tablet  Commonly known as:  PROTONIX  Take 1 tablet (40 mg total) by mouth 2 (two) times daily.     potassium chloride SA 20 MEQ tablet  Commonly known as:  K-DUR,KLOR-CON  Take 2 tablets (40 mEq total) by mouth 2 (two) times daily.     thiamine 100 MG tablet  Take 1 tablet  (100 mg total) by mouth daily.       No Known Allergies     Follow-up Information   Follow up with Sueanne Margarita, MD On 09/04/2013. (8:15AM)    Specialty:  Cardiology   Contact information:   [address] [place] [address] [phone]        The results of significant diagnostics from this hospitalization (including imaging, microbiology, ancillary and laboratory) are listed below for reference.    Significant Diagnostic Studies: Dg Chest 2 View  08/17/2013   CLINICAL DATA:  Shortness of breath. Dizziness. Chest pain. Lower extremity swelling.  EXAM: CHEST  2 VIEW  COMPARISON:  None.  FINDINGS: The heart size and mediastinal contours are within normal limits. Both lungs are clear. The visualized skeletal structures are unremarkable.  IMPRESSION: No active cardiopulmonary disease.   Electronically Signed   By: Earle Gell M.D.   On: 08/17/2013 15:02   Ct Soft Tissue Neck W Contrast  08/22/2013   CLINICAL DATA:  Lymphadenopathy.  EXAM: CT NECK WITH CONTRAST  TECHNIQUE: Multidetector CT imaging of the neck was performed using the standard protocol following the bolus administration of intravenous contrast.  CONTRAST:  171mL OMNIPAQUE IOHEXOL 300 MG/ML  SOLN  COMPARISON:  US SOFT TISSUE HEAD/NECK dated 08/21/2013; CT ANGIO CHEST W/CM &/OR WO/CM dated 08/18/2013  FINDINGS: Prominent venous structures in the supraclavicular fossa without discrete nodal conglomeration. 7 mm round left level 4 versus 5b lymph node, in addition to smaller level 5 lymph nodes (coronal 58/101). No lymphadenopathy by CT size criteria.  Aerodigestive tract is nonsuspicious, retropharyngeal course of the carotid arteries mildly deforms the posterior wall of the pharynx. Mild calcific atherosclerosis of the carotid bulbs. No free fluid or focal without collections of the neck. Normal appearance of the major salivary glands and thyroid gland though, evaluation somewhat limited by shoulder attenuation.  Strain  cervical lordosis with moderate degenerative disc disease. Mild levoscoliosis. Small right maxillary mucosal retention cyst with mucosal thickening. Mastoid air cells are well aerated.  IMPRESSION: No lymphadenopathy by CT size criteria; 7 mm left level 4 versus 5b round lymph node.   Electronically Signed   By: Elon Alas   On: 08/22/2013 04:05   Ct Angio Chest Pe W/cm &/or Wo Cm  08/18/2013   CLINICAL DATA:  Positive D-dimer. Admitted with dizziness and left lower extremity edema.  EXAM: CT ANGIOGRAPHY CHEST WITH CONTRAST  TECHNIQUE: Multidetector CT imaging of the chest was performed using the standard protocol during bolus administration of intravenous contrast. Multiplanar CT image reconstructions and MIPs were obtained to evaluate the vascular anatomy.  CONTRAST:  87mL OMNIPAQUE IOHEXOL 350 MG/ML SOLN  COMPARISON:  Chest radiograph 08/17/2013  FINDINGS: There is no evidence for a large or central pulmonary embolism. The study has technical limitations due to motion artifact and poor opacification of the distal branches. Coronary arteries are heavily calcified. No significant pericardial or pleural fluid. The left hepatic lobe is diffusely low density. The right hepatic lobe has poor definition of the internal architecture. There appears to be recanalization of the umbilical vein. Findings are concerning for geographic hepatic steatosis and possibly cirrhosis. Normal appearance of the adrenal glands. No significant chest lymphadenopathy. The gallbladder is distended and contains high density fluid. Findings are suggestive for sludge.  Trachea and mainstem bronchi are patent. There are patchy parenchymal densities in the lower lungs, left side greater than right. Small amount of pleural-based consolidation in the medial right lower lobe. Findings are concerning for areas of airspace disease and infection. No significant pleural effusions. No acute bone abnormality.  Review of the MIP images confirms the  above findings.  IMPRESSION: The study has technical limitations due to motion artifact. There is no evidence for a large or central pulmonary embolism.  Patchy airspace densities in the lower lobes, left side greater than right. Findings are concerning for pneumonia or aspiration.  Abnormal appearance of the liver. Findings may represent a combination of geographic hepatic steatosis and cirrhosis.  High-density material in the gallbladder. Findings could represent sludge.   Electronically Signed   By: Scherrie Gerlach.D.  On: 08/18/2013 16:01   US Soft Tissue Head/neck  08/21/2013   CLINICAL DATA:  Adenopathy suspected on physical exam  EXAM: ULTRASOUND OF HEAD/NECK SOFT TISSUES  TECHNIQUE: Ultrasound examination of the head and neck soft tissues was performed in the area of clinical concern.  COMPARISON:  None.  FINDINGS: Two left supraclavicular lymph nodes are identified both with characteristic ultrasound morphology, both 6 mm short axis diameter. No adenopathy, mass, cyst, or other lesion identified. Survey images of the right supraclavicular region unremarkable.  IMPRESSION: Two normal-sized left supraclavicular lymph nodes are identified.   Electronically Signed   By: Arne Cleveland M.D.   On: 08/21/2013 16:21   US Abdomen Complete  08/22/2013   CLINICAL DATA:  Pancytopenia  EXAM: ULTRASOUND ABDOMEN COMPLETE  COMPARISON:  None.  FINDINGS: Gallbladder:  Mild gallbladder distention. No wall thickening or stones. Negative sonographic Murphy's.  Common bile duct:  Diameter: Normal caliber, 4 mm  Liver:  Increased/heterogeneous echotexture suggesting fatty infiltration or intrinsic liver disease. No biliary ductal dilatation or focal abnormality.  IVC:  No abnormality visualized.  Pancreas:  Visualized portion unremarkable.  Spleen:  Normal in craniocaudal length at 6.7 cm.  No focal abnormality.  Right Kidney:  Length: 12.6. Echogenicity within normal limits. No mass or hydronephrosis visualized.  Left Kidney:   Length: 13.3. Normal echogenicity. No hydronephrosis. 5 mm echogenic focus within the lower pole may represent a small angiomyolipoma.  Abdominal aorta:  No aneurysm visualized.  Other findings:  None.  IMPRESSION: Gallbladder appears mildly distended, but there is no wall thickening or sonographic Murphy's sign. Negative for stones.  Suspect small angiomyolipoma in the lower pole of the left kidney.  Increased echotexture throughout the liver compatible with fatty infiltration or intrinsic liver disease.  No evidence of splenomegaly.  No acute findings.   Electronically Signed   By: Rolm Baptise M.D.   On: 08/22/2013 10:23   Nm Myocar Multi W/spect W/wall Motion / Ef  08/23/2013   CLINICAL DATA:  Chest pain  EXAM: Lexiscan Myovue  TECHNIQUE: The patient received IV Lexiscan .4mg  over 15 seconds. 33.0 mCi of Technetium 37m Sestamibi injected at 30 seconds. Quantitative SPECT images were obtained in the vertical, horizontal and short axis planes after a 45 minute delay. Rest images were obtained with similar planes and delay using 10.2 mCi of Technetium 38m Sestamibi.  FINDINGS: ECG:  No ischemic changes on Lexiscan stress ECG  Symptoms:  Chest pain  Quantitiative Gated SPECT EF:  36% with global hypokinesis  Perfusion Images: Normal perfusion at rest and with Lexiscan stress.  IMPRESSION: 1.  No evidence for ischemia or infarction.  2. EF 36% with diffuse hypokinesis, suspect nonischemic cardiomyopathy.  Dalton Mclean   Electronically Signed   By: Loralie Champagne M.D.   On: 08/23/2013 17:12    Microbiology: No results found for this or any previous visit (from the past 240 hour(s)).   Labs: Basic Metabolic Panel:  Recent Labs Lab 08/17/13 1340 08/17/13 2108  08/19/13 0420 08/20/13 0506 08/21/13 0530 08/22/13 0418 08/23/13 0255 08/24/13 0520  NA 133*  --   < > 134* 135* 135* 140 138 137  K 3.6*  --   < > 3.5* 3.3* 3.4* 3.9 3.9 4.3  CL 87*  --   < > 91* 93* 95* 98 99 100  CO2 24  --   < > 28 30  28 27 27 24   GLUCOSE 168*  --   < > 100* 89 91 89 92  92  BUN 23  --   < > 18 12 10 9 9 10   CREATININE 0.70  --   < > 0.84 0.84 0.96 1.07 1.13 1.21  CALCIUM 8.8  --   < > 8.6 8.1* 8.1* 8.6 8.5 8.8  MG  --  1.5  --  1.6 1.5  --   --   --  1.6  < > = values in this interval not displayed. Liver Function Tests:  Recent Labs Lab 08/17/13 1340 08/19/13 0420 08/23/13 0255  AST 93* 168* 63*  ALT 28 34 29  ALKPHOS 64 59 54  BILITOT 1.8* 1.1 0.8  PROT 6.8 6.2 5.7*  ALBUMIN 3.4* 3.2* 2.8*   No results found for this basename: LIPASE, AMYLASE,  in the last 168 hours No results found for this basename: AMMONIA,  in the last 168 hours CBC:  Recent Labs Lab 08/20/13 0506 08/21/13 0530 08/22/13 0418 08/23/13 0255 08/24/13 0520  WBC 2.9* 3.5* 4.0 3.8* 4.1  NEUTROABS 1.2* 1.6* 2.2 2.1 2.2  HGB 10.2* 10.0* 10.2* 10.2* 10.3*  HCT 29.0* 28.5* 29.6* 29.4* 29.9*  MCV 100.7* 101.4* 101.7* 102.1* 104.2*  PLT 60* 66* 73* 88* 119*   Cardiac Enzymes: No results found for this basename: CKTOTAL, CKMB, CKMBINDEX, TROPONINI,  in the last 168 hours BNP: BNP (last 3 results)  Recent Labs  08/17/13 1346  PROBNP 1234.0*   CBG:  Recent Labs Lab 08/21/13 1141 08/22/13 0613 08/22/13 1156 08/22/13 1615 08/22/13 2140  GLUCAP 85 79 108* 88 107*       Signed:  Geradine Girt  [facility] 08/24/2013, 12:31 PM

## 2013-08-24 NOTE — Progress Notes (Signed)
Patient's H. pylori antibody was positive in the setting of recently documented gastroduodenitis He is to be treated with triple therapy on discharge. Would prescribe Pylera to be taken for 10 days, which includes antibiotics and twice daily PPI He should be encouraged to take this completely and call with any problems He has office followup with [facility] with questions

## 2013-08-30 ENCOUNTER — Inpatient Hospital Stay: Payer: 59 | Admitting: Cardiology

## 2013-08-30 ENCOUNTER — Telehealth: Payer: Self-pay

## 2013-08-30 NOTE — Telephone Encounter (Signed)
Placed call to patient to inform her to keep appointment with Dr. Radford Pax on 09/04/13. No need to see Dr. Radford Pax and Dr. Verl Blalock. Unable to reach patient on phone or leave voicemail.

## 2013-09-04 ENCOUNTER — Ambulatory Visit (INDEPENDENT_AMBULATORY_CARE_PROVIDER_SITE_OTHER): Payer: 59 | Admitting: Cardiology

## 2013-09-04 ENCOUNTER — Encounter: Payer: Self-pay | Admitting: General Surgery

## 2013-09-04 ENCOUNTER — Encounter: Payer: Self-pay | Admitting: Cardiology

## 2013-09-04 VITALS — BP 148/90 | HR 86 | Ht 73.0 in | Wt 282.0 lb

## 2013-09-04 DIAGNOSIS — I5022 Chronic systolic (congestive) heart failure: Secondary | ICD-10-CM

## 2013-09-04 DIAGNOSIS — I428 Other cardiomyopathies: Secondary | ICD-10-CM

## 2013-09-04 DIAGNOSIS — I509 Heart failure, unspecified: Secondary | ICD-10-CM

## 2013-09-04 DIAGNOSIS — I10 Essential (primary) hypertension: Secondary | ICD-10-CM | POA: Insufficient documentation

## 2013-09-04 DIAGNOSIS — I420 Dilated cardiomyopathy: Secondary | ICD-10-CM

## 2013-09-04 DIAGNOSIS — I4891 Unspecified atrial fibrillation: Secondary | ICD-10-CM

## 2013-09-04 LAB — BASIC METABOLIC PANEL
BUN: 15 mg/dL (ref 6–23)
CALCIUM: 9.7 mg/dL (ref 8.4–10.5)
CO2: 23 mEq/L (ref 19–32)
Chloride: 103 mEq/L (ref 96–112)
Creatinine, Ser: 1.3 mg/dL (ref 0.4–1.5)
GFR: 74.94 mL/min (ref >60.00)
Glucose, Bld: 83 mg/dL (ref 70–99)
POTASSIUM: 4.5 meq/L (ref 3.5–5.1)
SODIUM: 137 meq/L (ref 135–145)

## 2013-09-04 MED ORDER — CARVEDILOL 12.5 MG PO TABS: 12.5000 mg | ORAL_TABLET | Freq: Two times a day (BID) | ORAL | Status: DC

## 2013-09-04 NOTE — Progress Notes (Signed)
[place], [place] [address], [place]  [address] Phone: [phone] Fax:  [phone]  Date:  09/04/2013   ID:  Thomas [name], DOB May 27, 1950, MRN [id number]  PCP:  No PCP Per Patient  Cardiologist:  Fransico Him, MD     History of Present Illness: The patient is a 38 male with a history of obesity, ETOH abuse and arthritis. He was recently in the hospital due to   feeling dizzy and wobbly in the legs for the last two days, 2 weeks of LEE a lot worse in the left leg and some dark tarry stools for two weeks. He reported a decreased appetite as well. He was takeing no medications and had not seen a PCP in a long time.  He was found to be in atrial fibrillation with RVR, pancytopenic felt from alcoholism and was found to be in acute systolic CHF with DCM felt secondary to alcohol.  He was started on digoxin and Coreg.  He was felt not to be a good candidate for anticoagulation due to his alcoholism and medical noncompliance.  He was diuresed for his CHF and a Lexiscan myoview did not show an ischemia.  He is on a beta blocker and ARB. He was fond to have alcoholic liver disease with evidence of cirrhosis on CT.  He was also diagnosed with gastroduodenitis and H pylroi positive and is on triple therapy. He is doing well today.  He denies any chest pain, SOB, DOE, dizziness, palpitations or syncope.  He has occasional LE edema but is has improved since hospitalization.    Wt Readings from Last 3 Encounters:  09/04/13 282 lb (127.914 kg)  08/24/13 278 lb 3.5 oz (126.2 kg)  08/24/13 278 lb 3.5 oz (126.2 kg)     Past Medical History  Diagnosis Date  . Obesity   . Arthritis   . Hypertension   . Chronic systolic CHF (congestive heart failure), NYHA class 2   . DCM (dilated cardiomyopathy)     EF 30% by echo 07/2013 with moderate RV dysfunction and moderate MR  . Pancytopenia   . ETOH abuse   . Cirrhosis, alcoholic   . Gastroduodenitis     H Pylori positive  . Chronic atrial  fibrillation     no an anticoagulation canditate due to alcohol abuse, liver cirrhosis with pancytopenia, medical noncompliance  . Diverticulosis of colon   . Portal hypertensive gastropathy     Current Outpatient Prescriptions  Medication Sig Dispense Refill  . aspirin EC 81 MG EC tablet Take 1 tablet (81 mg total) by mouth daily.      . carvedilol (COREG) 12.5 MG tablet Take 1 tablet (12.5 mg total) by mouth 2 (two) times daily with a meal.  60 tablet  5  . digoxin (LANOXIN) 0.125 MG tablet Take 1 tablet (0.125 mg total) by mouth daily.  30 tablet  0  . folic acid (FOLVITE) 1 MG tablet Take 5 tablets (5 mg total) by mouth daily.      . furosemide (LASIX) 40 MG tablet Take 1 tablet (40 mg total) by mouth 2 (two) times daily.  60 tablet  0  . losartan (COZAAR) 25 MG tablet Take 1 tablet (25 mg total) by mouth daily.  30 tablet  0  . Multiple Vitamin (MULTIVITAMIN WITH MINERALS) TABS tablet Take 1 tablet by mouth daily.      . pantoprazole (PROTONIX) 40 MG tablet Take 1 tablet (40 mg total) by mouth 2 (two) times  daily.  60 tablet  0  . potassium chloride SA (K-DUR,KLOR-CON) 20 MEQ tablet Take 2 tablets (40 mEq total) by mouth 2 (two) times daily.  60 tablet  0  . thiamine 100 MG tablet Take 1 tablet (100 mg total) by mouth daily.      . vitamin B-12 1000 MCG tablet Take 1 tablet (1,000 mcg total) by mouth daily.       No current facility-administered medications for this visit.    Allergies:   No Known Allergies  Social History:  The patient  reports that he has never smoked. He has never used smokeless tobacco. He reports that he drinks about 18 ounces of alcohol per week. He reports that he does not use illicit drugs.   Family History:  The patient's family history includes Hypertension in his father.   ROS:  Please see the history of present illness.      All other systems reviewed and negative.   PHYSICAL EXAM: VS:  BP 148/90  Pulse 86  Ht 6\' 1"  (1.854 m)  Wt 282 lb (127.914  kg)  BMI 37.21 kg/m2 Well nourished, well developed, in no acute distress HEENT: normal Neck: no JVD Cardiac:  normal S1, S2; RRR; no murmur Lungs:  clear to auscultation bilaterally, no wheezing, rhonchi or rales Abd: soft, nontender, no hepatomegaly Ext: no edema Skin: warm and dry Neuro:  CNs 2-12 intact, no focal abnormalities noted   EKG:  Atrial fibrillation with RVR at 101bpm    ASSESSMENT AND PLAN:  1. Chronic atrial fibrillation rate controlled  - no an anticoagulation candidate due to thrombocytopenia, alcohol abuse and medical noncompliance - continue ASA/Coreg/dig 2. DCM EF 30% with moderate RV dysfunction as well - presumed secondary to alcohol 3. Chronic systolic CHF - appears euvolemic - continue Losartan/coreg/dig/Lasix - check BMET 4. HTN - borderline control - Increase Coreg to 12.5mg  BID - continue Losartan 5. ETOH abuse  BP check with nurse in 1 week Followup with me in 3 months  Signed, Fransico Him, MD 09/04/2013 9:07 AM

## 2013-09-04 NOTE — Patient Instructions (Signed)
Your physician has recommended you make the following change in your medication:   1. Increase Coreg to 12.5 MG 1 tablet Twice a day  Your physician recommends that you go to the lab today for a Bmet  Your physician recommends that you schedule a follow-up appointment in: One week for a BP check with Nurse  Your physician recommends that you schedule a follow-up appointment in: 3 months with Dr Radford Pax

## 2013-09-06 ENCOUNTER — Telehealth: Payer: Self-pay | Admitting: Cardiology

## 2013-09-06 ENCOUNTER — Other Ambulatory Visit: Payer: Self-pay | Admitting: General Surgery

## 2013-09-06 NOTE — Telephone Encounter (Signed)
New message     Pt saw Dr Radford Pax recently. Patient want to go over medication list.  The medication list given at check out is different than what he is currently on. Please call

## 2013-09-06 NOTE — Telephone Encounter (Signed)
Spoke with pt and amde aware of medications and what dosages he should be on

## 2013-09-12 ENCOUNTER — Ambulatory Visit (INDEPENDENT_AMBULATORY_CARE_PROVIDER_SITE_OTHER): Payer: 59 | Admitting: *Deleted

## 2013-09-12 VITALS — BP 124/86 | HR 76 | Resp 18 | Wt 282.8 lb

## 2013-09-12 DIAGNOSIS — I10 Essential (primary) hypertension: Secondary | ICD-10-CM

## 2013-09-12 NOTE — Patient Instructions (Signed)
Your physician recommends that you continue on your current medications as directed. Please refer to the Current Medication list given to you today.     

## 2013-09-12 NOTE — Progress Notes (Signed)
1.) Reason for visit: BP check    2.) Name of MD requesting visit: Dr. Radford Pax  3.) H&P: OV 5/18--BP was 148/90.  At that time, Coreg increased to 12.5 BID  4.) ROS related to problem: BP today is 124/86, HR 76, irregular (pt has chronic afib)  5.) Assessment and plan per MD: Reviewed with Dr. Radford Pax.  No further medicine changes needed.  Above note reviewed and agree with assessment and plan as outlined above.   Fransico Him, MD

## 2013-09-19 ENCOUNTER — Other Ambulatory Visit: Payer: Self-pay | Admitting: *Deleted

## 2013-09-19 ENCOUNTER — Telehealth: Payer: Self-pay | Admitting: *Deleted

## 2013-09-19 MED ORDER — DIGOXIN 125 MCG PO TABS: 0.1250 mg | ORAL_TABLET | Freq: Every day | ORAL | Status: DC

## 2013-09-19 MED ORDER — FUROSEMIDE 40 MG PO TABS: 40.0000 mg | ORAL_TABLET | Freq: Two times a day (BID) | ORAL | Status: DC

## 2013-09-19 MED ORDER — LOSARTAN POTASSIUM 25 MG PO TABS: 25.0000 mg | ORAL_TABLET | Freq: Every day | ORAL | Status: DC

## 2013-09-19 NOTE — Telephone Encounter (Signed)
Does Dr Radford Pax refill protonix, folic acid and thiamine for this patient? Please advise. Thanks, MI

## 2013-09-19 NOTE — Telephone Encounter (Signed)
Please let pt know pcp needs to refill.

## 2013-09-19 NOTE — Telephone Encounter (Signed)
To Dr. Radford Pax, do we refill below medications?

## 2013-09-19 NOTE — Telephone Encounter (Signed)
Routed to [facility] and Amy.

## 2013-09-19 NOTE — Telephone Encounter (Signed)
Patients mother aware  

## 2013-09-19 NOTE — Telephone Encounter (Signed)
PCP needs to refill

## 2013-09-19 NOTE — Telephone Encounter (Signed)
Patients mother called me back and was wanting to know who can she get to refill these three medications for the patient. He does not have a pcp and wants to get Dr Radford Pax to recommend one and also would she be willing to refill at least the protonix if he schedules an appt with a pcp. Please advise. Thanks, MI

## 2013-09-20 NOTE — Telephone Encounter (Signed)
Would try to get patient in with PCP with [facility] at what ever office they are close to.  OK to refill Protonix for a month

## 2013-09-20 NOTE — Telephone Encounter (Signed)
To Dr. Turner, please advise.  

## 2013-09-20 NOTE — Telephone Encounter (Signed)
LMTCB [id number] 

## 2013-09-22 ENCOUNTER — Other Ambulatory Visit: Payer: Self-pay

## 2013-09-22 MED ORDER — PANTOPRAZOLE SODIUM 40 MG PO TBEC: 40.0000 mg | DELAYED_RELEASE_TABLET | Freq: Two times a day (BID) | ORAL | Status: DC

## 2013-09-22 NOTE — Telephone Encounter (Signed)
Notified pt that Dr. Radford Pax will authorize Protonix for 1 month.  Since he doesn't have PCP gave him phone number for [facility] [address] to call and schedule an appointment.  He states he will call.

## 2013-10-18 ENCOUNTER — Encounter: Payer: Self-pay | Admitting: Internal Medicine

## 2013-10-18 ENCOUNTER — Ambulatory Visit (INDEPENDENT_AMBULATORY_CARE_PROVIDER_SITE_OTHER): Payer: 59 | Admitting: Internal Medicine

## 2013-10-18 VITALS — BP 130/90 | HR 72 | Ht 71.25 in | Wt 293.4 lb

## 2013-10-18 DIAGNOSIS — K2940 Chronic atrophic gastritis without bleeding: Secondary | ICD-10-CM

## 2013-10-18 DIAGNOSIS — B9681 Helicobacter pylori [H. pylori] as the cause of diseases classified elsewhere: Secondary | ICD-10-CM

## 2013-10-18 DIAGNOSIS — K2970 Gastritis, unspecified, without bleeding: Secondary | ICD-10-CM

## 2013-10-18 DIAGNOSIS — T560X1S Toxic effect of lead and its compounds, accidental (unintentional), sequela: Secondary | ICD-10-CM

## 2013-10-18 DIAGNOSIS — K319 Disease of stomach and duodenum, unspecified: Secondary | ICD-10-CM

## 2013-10-18 DIAGNOSIS — K709 Alcoholic liver disease, unspecified: Secondary | ICD-10-CM

## 2013-10-18 DIAGNOSIS — K766 Portal hypertension: Secondary | ICD-10-CM

## 2013-10-18 DIAGNOSIS — T6591XS Toxic effect of unspecified substance, accidental (unintentional), sequela: Secondary | ICD-10-CM

## 2013-10-18 DIAGNOSIS — A048 Other specified bacterial intestinal infections: Secondary | ICD-10-CM

## 2013-10-18 DIAGNOSIS — K3189 Other diseases of stomach and duodenum: Secondary | ICD-10-CM

## 2013-10-18 NOTE — Patient Instructions (Signed)
Glad to hear and see you are better. Please continue to avoid alcohol.  I do think you should have your blood lead level rechecked - let me know if you are willing to - we can arrange at anytime. I appreciate the opportunity to care for you. Gatha Mayer, MD, [facility]  Lead Poisoning   Your caregiver wants you to have information about lead poisoning.. Lead is found in lead-based paints which were used in most houses built before 1978. It also is present in dust and soil contaminated by:  Paint.  Gasoline.  Other industrial chemicals. Moonshine Lead is also present in water that flows through lead pipes and plumbing fixtures. Improperly treated ceramic ware and lead crystal can increase lead content in food. Lead poisoning is preventable.  If there are high levels of lead detected in the body, it can cause people to have problems with their:  Brain.  Kidneys.  Bone marrow (the soft tissue inside bones). Even if there are lower levels of lead detected in the body, behavior problems and learning difficulties can occur.  SYMPTOMS  Symptoms of high lead levels can include belly pain, headaches, vomiting, confusion, muscle weakness, seizures, hair loss and low red blood cell count (anemia).  TREATMENT  Treatment includes removing the sources of lead in the environment. If the blood lead levels are over 45 micrograms (mcg), YOURS WAS 60 a therapy may be needed to bind the lead in the blood and help remove it (chelation therapy). Other factors in treatment include good nutrition with foods high in calcium and iron. Repeat blood lead levels and other tests are used to follow the progress of treatment. Be sure to see your child's caregiver for further care as recommended.  Contact your local health department. They may be able to help you and your family find lead problems in your home and tell you if there are any lead problems in the area.  PREVENTION  Families can help prevent their children from  having lead poisoning. Lead reducing steps include:  If you live in a house or an apartment built before 1978, paint that is peeling needs to be removed from all surfaces up to 5 feet above the floor.  Do not store food or drink in ceramic pottery that may have lead glazes.  Use only cold water from your tap or bottled water for drinking or cooking (hot water has more dissolved lead).  Mop your floors frequently and wash off your child's hands and face before eating. Wash any toys that they may suck on or put in their mouth.  Make sure your child is not exposed to peeling paint that may contain lead. Close off rooms that are being remodeled (by using plastic sheeting) to reduce the spread of dust that may contain lead. Document Released: 05/14/2004 Document Revised: 06/29/2011 Document Reviewed: 10/25/2008  [facility] Patient Information 2015 [place], [place]. This information is not intended to replace advice given to you by your health care provider. Make sure you discuss any questions you have with your health care provider.

## 2013-10-18 NOTE — Progress Notes (Signed)
Subjective:    Patient ID: Thomas [name], male    DOB: Jan 06, 1951, 63 y.o.   MRN: [id number]  HPI The patient is here for f/u after hospitalization for new dx alcoholic liver disease and portal htn. Had H. Pylori gastritis at EGD and portal gastropathy and adenomatous colon polyps at colonoscopy. Feels well "I do't think I need to be here" Stopped all alcohol Was not aware lead level high Claims he took Pylera post hospital Ran out of pantoprazole and some other meds - not clear what he is taking Not interested in f/u No Known Allergies Outpatient Prescriptions Prior to Visit  Medication Sig Dispense Refill  . aspirin EC 81 MG EC tablet Take 1 tablet (81 mg total) by mouth daily.      . carvedilol (COREG) 12.5 MG tablet Take 1 tablet (12.5 mg total) by mouth 2 (two) times daily with a meal.  60 tablet  5  . digoxin (LANOXIN) 0.125 MG tablet Take 1 tablet (0.125 mg total) by mouth daily.  30 tablet  3  . folic acid (FOLVITE) 1 MG tablet Take 5 tablets (5 mg total) by mouth daily.      . furosemide (LASIX) 40 MG tablet Take 1 tablet (40 mg total) by mouth 2 (two) times daily.  60 tablet  3  . losartan (COZAAR) 25 MG tablet Take 1 tablet (25 mg total) by mouth daily.  30 tablet  3  . Multiple Vitamin (MULTIVITAMIN WITH MINERALS) TABS tablet Take 1 tablet by mouth daily.      . potassium chloride SA (K-DUR,KLOR-CON) 20 MEQ tablet Take 2 tablets (40 mEq total) by mouth 2 (two) times daily.  60 tablet  0  . thiamine 100 MG tablet Take 1 tablet (100 mg total) by mouth daily.      . vitamin B-12 1000 MCG tablet Take 1 tablet (1,000 mcg total) by mouth daily.      . pantoprazole (PROTONIX) 40 MG tablet Take 1 tablet (40 mg total) by mouth 2 (two) times daily.  60 tablet  0   No facility-administered medications prior to visit.   Past Medical History  Diagnosis Date  . Obesity   . Arthritis   . Hypertension   . Chronic systolic CHF (congestive heart failure), NYHA class 2   . DCM  (dilated cardiomyopathy)     EF 30% by echo 07/2013 with moderate RV dysfunction and moderate MR  . Pancytopenia   . ETOH abuse   . Alcoholic liver disease   . Gastroduodenitis     H Pylori positive  . Chronic atrial fibrillation     no an anticoagulation canditate due to alcohol abuse, liver cirrhosis with pancytopenia, medical noncompliance  . Diverticulosis of colon   . Portal hypertensive gastropathy   . Personal history of colonic polyps - adenomas 08/21/2013  . Helicobacter pylori gastritis 10/19/2013   Past Surgical History  Procedure Laterality Date  . Esophagogastroduodenoscopy N/A 08/21/2013    Procedure: ESOPHAGOGASTRODUODENOSCOPY (EGD);  Surgeon: Jerene Bears, MD;  Location[facility] ENDOSCOPY;  Service: Endoscopy;  Laterality: N/A;  . Colonoscopy N/A 08/21/2013    Procedure: COLONOSCOPY;  Surgeon: Jerene Bears, MD;  Location: [facility] ENDOSCOPY;  Service: Endoscopy;  Laterality: N/A;   History   Social History  . Marital Status: Single    Spouse Name: N/A    Number of Children: 0  . Years of Education: N/A   Social History Main Topics  .  Smoking status: Never Smoker   . Smokeless tobacco: Never Used  . Alcohol Use: 18.0 oz/week    30 Cans of beer per week     Comment: daily  . Drug Use: No  . Sexual Activity: None   Other Topics Concern  . None   Social History Narrative   Retired Sports coach city of [place]   Family History  Problem Relation Age of Onset  . Hypertension Father      Review of Systems As above    Objective:   Physical Exam General:  NAD, obese, using a cane Eyes:   anicteric Lungs:  clear Heart:  S1S2 no rubs, murmurs or gallops Abdomen:  Obese, soft and nontender, BS+, no HSM Ext:   no edema Data Reviewed:  Hospital notes and labs    Assessment & Plan:  Helicobacter pylori gastritis Treated with Pylrea   Alcoholic liver disease Abstinent per pt. Seems better To remain abstinent Has portal htn ? Hepatitis or cirrhosis or both He declines  further evaluation, vaccination, f/u  Lead toxicity Lead level 60 in hospital He declines recheck - I had surmised that he had gotten this through moonshine consumption No overt toxicity now Some hematologic abnormalities could have been from this vs. Portal htn

## 2013-10-19 ENCOUNTER — Encounter: Payer: Self-pay | Admitting: Internal Medicine

## 2013-10-19 DIAGNOSIS — B9681 Helicobacter pylori [H. pylori] as the cause of diseases classified elsewhere: Secondary | ICD-10-CM | POA: Insufficient documentation

## 2013-10-19 DIAGNOSIS — K2970 Gastritis, unspecified, without bleeding: Secondary | ICD-10-CM

## 2013-10-19 DIAGNOSIS — T560X1A Toxic effect of lead and its compounds, accidental (unintentional), initial encounter: Secondary | ICD-10-CM | POA: Insufficient documentation

## 2013-10-19 HISTORY — DX: Helicobacter pylori (H. pylori) as the cause of diseases classified elsewhere: B96.81

## 2013-10-19 NOTE — Assessment & Plan Note (Signed)
Abstinent per pt. Seems better To remain abstinent Has portal htn ? Hepatitis or cirrhosis or both He declines further evaluation, vaccination, f/u

## 2013-10-19 NOTE — Assessment & Plan Note (Signed)
Lead level 60 in hospital He declines recheck - I had surmised that he had gotten this through moonshine consumption No overt toxicity now Some hematologic abnormalities could have been from this vs. Portal htn

## 2013-10-19 NOTE — Assessment & Plan Note (Signed)
Treated with Pylrea

## 2013-12-05 ENCOUNTER — Ambulatory Visit: Payer: 59 | Admitting: Cardiology

## 2014-04-23 ENCOUNTER — Encounter: Payer: Self-pay | Admitting: Internal Medicine

## 2014-12-06 ENCOUNTER — Encounter: Payer: Self-pay | Admitting: Internal Medicine

## 2015-02-25 ENCOUNTER — Emergency Department (HOSPITAL_COMMUNITY): Payer: Commercial Managed Care - HMO

## 2015-02-25 ENCOUNTER — Inpatient Hospital Stay (HOSPITAL_COMMUNITY)
Admission: EM | Admit: 2015-02-25 | Discharge: 2015-03-02 | DRG: 309 | Disposition: A | Discharge status: Skilled Nursing Facility | Payer: Commercial Managed Care - HMO | Attending: Internal Medicine | Admitting: Internal Medicine

## 2015-02-25 ENCOUNTER — Encounter (HOSPITAL_COMMUNITY): Payer: Self-pay | Admitting: *Deleted

## 2015-02-25 DIAGNOSIS — I420 Dilated cardiomyopathy: Secondary | ICD-10-CM | POA: Diagnosis present

## 2015-02-25 DIAGNOSIS — F1010 Alcohol abuse, uncomplicated: Secondary | ICD-10-CM | POA: Diagnosis present

## 2015-02-25 DIAGNOSIS — E872 Acidosis, unspecified: Secondary | ICD-10-CM | POA: Diagnosis present

## 2015-02-25 DIAGNOSIS — M25551 Pain in right hip: Secondary | ICD-10-CM | POA: Diagnosis present

## 2015-02-25 DIAGNOSIS — N179 Acute kidney failure, unspecified: Secondary | ICD-10-CM | POA: Diagnosis not present

## 2015-02-25 DIAGNOSIS — Z9181 History of falling: Secondary | ICD-10-CM | POA: Diagnosis not present

## 2015-02-25 DIAGNOSIS — R296 Repeated falls: Secondary | ICD-10-CM | POA: Diagnosis present

## 2015-02-25 DIAGNOSIS — K709 Alcoholic liver disease, unspecified: Secondary | ICD-10-CM | POA: Diagnosis present

## 2015-02-25 DIAGNOSIS — D649 Anemia, unspecified: Secondary | ICD-10-CM | POA: Diagnosis present

## 2015-02-25 DIAGNOSIS — E669 Obesity, unspecified: Secondary | ICD-10-CM | POA: Diagnosis present

## 2015-02-25 DIAGNOSIS — M87851 Other osteonecrosis, right femur: Secondary | ICD-10-CM | POA: Diagnosis present

## 2015-02-25 DIAGNOSIS — I9589 Other hypotension: Secondary | ICD-10-CM | POA: Diagnosis present

## 2015-02-25 DIAGNOSIS — K766 Portal hypertension: Secondary | ICD-10-CM | POA: Diagnosis present

## 2015-02-25 DIAGNOSIS — Y900 Blood alcohol level of less than 20 mg/100 ml: Secondary | ICD-10-CM | POA: Diagnosis present

## 2015-02-25 DIAGNOSIS — I252 Old myocardial infarction: Secondary | ICD-10-CM | POA: Diagnosis not present

## 2015-02-25 DIAGNOSIS — I5022 Chronic systolic (congestive) heart failure: Secondary | ICD-10-CM | POA: Diagnosis present

## 2015-02-25 DIAGNOSIS — Z8249 Family history of ischemic heart disease and other diseases of the circulatory system: Secondary | ICD-10-CM | POA: Diagnosis not present

## 2015-02-25 DIAGNOSIS — E876 Hypokalemia: Secondary | ICD-10-CM | POA: Diagnosis not present

## 2015-02-25 DIAGNOSIS — Z7982 Long term (current) use of aspirin: Secondary | ICD-10-CM | POA: Diagnosis not present

## 2015-02-25 DIAGNOSIS — I959 Hypotension, unspecified: Secondary | ICD-10-CM | POA: Diagnosis not present

## 2015-02-25 DIAGNOSIS — B9681 Helicobacter pylori [H. pylori] as the cause of diseases classified elsewhere: Secondary | ICD-10-CM | POA: Diagnosis present

## 2015-02-25 DIAGNOSIS — I480 Paroxysmal atrial fibrillation: Secondary | ICD-10-CM | POA: Diagnosis present

## 2015-02-25 DIAGNOSIS — F1020 Alcohol dependence, uncomplicated: Secondary | ICD-10-CM | POA: Diagnosis present

## 2015-02-25 DIAGNOSIS — I4891 Unspecified atrial fibrillation: Secondary | ICD-10-CM | POA: Diagnosis present

## 2015-02-25 DIAGNOSIS — K3189 Other diseases of stomach and duodenum: Secondary | ICD-10-CM | POA: Diagnosis present

## 2015-02-25 DIAGNOSIS — I482 Chronic atrial fibrillation, unspecified: Secondary | ICD-10-CM | POA: Diagnosis present

## 2015-02-25 DIAGNOSIS — Z6836 Body mass index (BMI) 36.0-36.9, adult: Secondary | ICD-10-CM

## 2015-02-25 DIAGNOSIS — M160 Bilateral primary osteoarthritis of hip: Secondary | ICD-10-CM | POA: Diagnosis present

## 2015-02-25 DIAGNOSIS — E871 Hypo-osmolality and hyponatremia: Secondary | ICD-10-CM | POA: Diagnosis present

## 2015-02-25 DIAGNOSIS — K449 Diaphragmatic hernia without obstruction or gangrene: Secondary | ICD-10-CM | POA: Diagnosis present

## 2015-02-25 DIAGNOSIS — D696 Thrombocytopenia, unspecified: Secondary | ICD-10-CM | POA: Diagnosis present

## 2015-02-25 DIAGNOSIS — I110 Hypertensive heart disease with heart failure: Secondary | ICD-10-CM | POA: Diagnosis present

## 2015-02-25 DIAGNOSIS — Z9114 Patient's other noncompliance with medication regimen: Secondary | ICD-10-CM | POA: Diagnosis not present

## 2015-02-25 DIAGNOSIS — D61818 Other pancytopenia: Secondary | ICD-10-CM | POA: Diagnosis present

## 2015-02-25 DIAGNOSIS — R001 Bradycardia, unspecified: Secondary | ICD-10-CM | POA: Diagnosis present

## 2015-02-25 DIAGNOSIS — K2970 Gastritis, unspecified, without bleeding: Secondary | ICD-10-CM

## 2015-02-25 LAB — BASIC METABOLIC PANEL
ANION GAP: 13 (ref 5–15)
BUN: 14 mg/dL (ref 6–20)
CALCIUM: 8.5 mg/dL — AB (ref 8.9–10.3)
CHLORIDE: 89 mmol/L — AB (ref 101–111)
CO2: 24 mmol/L (ref 22–32)
Creatinine, Ser: 1.12 mg/dL (ref 0.61–1.24)
GFR calc non Af Amer: >60 mL/min (ref >60)
Glucose, Bld: 126 mg/dL — ABNORMAL HIGH (ref 65–99)
POTASSIUM: 3.6 mmol/L (ref 3.5–5.1)
Sodium: 126 mmol/L — ABNORMAL LOW (ref 135–145)

## 2015-02-25 LAB — I-STAT CG4 LACTIC ACID, ED
Lactic Acid, Venous: 13.35 mmol/L (ref 0.5–2.0)
Lactic Acid, Venous: 6.02 mmol/L (ref 0.5–2.0)

## 2015-02-25 LAB — COMPREHENSIVE METABOLIC PANEL
ALBUMIN: 3 g/dL — AB (ref 3.5–5.0)
ALT: 25 U/L (ref 17–63)
ANION GAP: 19 — AB (ref 5–15)
AST: 99 U/L — ABNORMAL HIGH (ref 15–41)
Alkaline Phosphatase: 63 U/L (ref 38–126)
BUN: 11 mg/dL (ref 6–20)
CALCIUM: 8.5 mg/dL — AB (ref 8.9–10.3)
CHLORIDE: 88 mmol/L — AB (ref 101–111)
CO2: 19 mmol/L — AB (ref 22–32)
Creatinine, Ser: 1.26 mg/dL — ABNORMAL HIGH (ref 0.61–1.24)
GFR calc non Af Amer: 59 mL/min — ABNORMAL LOW (ref >60)
GLUCOSE: 137 mg/dL — AB (ref 65–99)
POTASSIUM: 4.1 mmol/L (ref 3.5–5.1)
SODIUM: 126 mmol/L — AB (ref 135–145)
Total Bilirubin: 4.3 mg/dL — ABNORMAL HIGH (ref 0.3–1.2)
Total Protein: 5.6 g/dL — ABNORMAL LOW (ref 6.5–8.1)

## 2015-02-25 LAB — CBC WITH DIFFERENTIAL/PLATELET
BASOS ABS: 0 10*3/uL (ref 0.0–0.1)
BASOS PCT: 0 %
Eosinophils Absolute: 0 10*3/uL (ref 0.0–0.7)
Eosinophils Relative: 0 %
HEMATOCRIT: 25.1 % — AB (ref 39.0–52.0)
HEMOGLOBIN: 8.9 g/dL — AB (ref 13.0–17.0)
LYMPHS PCT: 5 %
Lymphs Abs: 0.3 10*3/uL — ABNORMAL LOW (ref 0.7–4.0)
MCH: 38.5 pg — ABNORMAL HIGH (ref 26.0–34.0)
MCHC: 35.5 g/dL (ref 30.0–36.0)
MCV: 108.7 fL — ABNORMAL HIGH (ref 78.0–100.0)
Monocytes Absolute: 0.4 10*3/uL (ref 0.1–1.0)
Monocytes Relative: 6 %
NEUTROS ABS: 5.9 10*3/uL (ref 1.7–7.7)
NEUTROS PCT: 89 %
Platelets: 96 10*3/uL — ABNORMAL LOW (ref 150–400)
RBC: 2.31 MIL/uL — AB (ref 4.22–5.81)
RDW: 19.7 % — ABNORMAL HIGH (ref 11.5–15.5)
WBC: 6.7 10*3/uL (ref 4.0–10.5)

## 2015-02-25 LAB — LACTIC ACID, PLASMA
LACTIC ACID, VENOUS: 3.5 mmol/L — AB (ref 0.5–2.0)
Lactic Acid, Venous: 4.2 mmol/L (ref 0.5–2.0)

## 2015-02-25 LAB — ETHANOL: Alcohol, Ethyl (B): <5 mg/dL (ref <5)

## 2015-02-25 LAB — MRSA PCR SCREENING: MRSA BY PCR: POSITIVE — AB

## 2015-02-25 LAB — GLUCOSE, CAPILLARY
Glucose-Capillary: 129 mg/dL — ABNORMAL HIGH (ref 65–99)
Glucose-Capillary: 138 mg/dL — ABNORMAL HIGH (ref 65–99)

## 2015-02-25 LAB — TSH: TSH: 2.197 u[IU]/mL (ref 0.350–4.500)

## 2015-02-25 LAB — VITAMIN B12: Vitamin B-12: 430 pg/mL (ref 180–914)

## 2015-02-25 LAB — I-STAT TROPONIN, ED: TROPONIN I, POC: 0 ng/mL (ref 0.00–0.08)

## 2015-02-25 LAB — CBG MONITORING, ED: Glucose-Capillary: 155 mg/dL — ABNORMAL HIGH (ref 65–99)

## 2015-02-25 LAB — BRAIN NATRIURETIC PEPTIDE: B NATRIURETIC PEPTIDE 5: 236.2 pg/mL — AB (ref 0.0–100.0)

## 2015-02-25 MED ORDER — DILTIAZEM HCL 100 MG IV SOLR
5.0000 mg/h | Freq: Once | INTRAVENOUS | Status: AC
  Administered 2015-02-25: 5 mg/h via INTRAVENOUS
  Filled 2015-02-25: qty 100

## 2015-02-25 MED ORDER — ACETAMINOPHEN 325 MG PO TABS: 650.0000 mg | ORAL_TABLET | ORAL | Status: DC | PRN

## 2015-02-25 MED ORDER — ADULT MULTIVITAMIN W/MINERALS CH
1.0000 | ORAL_TABLET | Freq: Every day | ORAL | Status: DC
  Administered 2015-02-26 – 2015-03-02 (×5): 1 via ORAL
  Filled 2015-02-25 (×6): qty 1

## 2015-02-25 MED ORDER — SODIUM CHLORIDE 0.9 % IV BOLUS (SEPSIS)
500.0000 mL | Freq: Once | INTRAVENOUS | Status: AC
  Administered 2015-02-25: 500 mL via INTRAVENOUS

## 2015-02-25 MED ORDER — VITAMIN B-12 1000 MCG PO TABS
1000.0000 ug | ORAL_TABLET | Freq: Every day | ORAL | Status: DC
  Administered 2015-02-26 – 2015-03-02 (×5): 1000 ug via ORAL
  Filled 2015-02-25 (×5): qty 1

## 2015-02-25 MED ORDER — LORAZEPAM 2 MG/ML IJ SOLN: 1.0000 mg | Freq: Four times a day (QID) | INTRAMUSCULAR | Status: DC | PRN

## 2015-02-25 MED ORDER — FOLIC ACID 1 MG PO TABS
5.0000 mg | ORAL_TABLET | Freq: Every day | ORAL | Status: DC
  Administered 2015-02-26: 5 mg via ORAL
  Filled 2015-02-25: qty 5

## 2015-02-25 MED ORDER — ONDANSETRON HCL 4 MG/2ML IJ SOLN: 4.0000 mg | Freq: Four times a day (QID) | INTRAMUSCULAR | Status: DC | PRN

## 2015-02-25 MED ORDER — LORAZEPAM 1 MG PO TABS: 1.0000 mg | ORAL_TABLET | Freq: Four times a day (QID) | ORAL | Status: DC | PRN

## 2015-02-25 MED ORDER — INSULIN ASPART 100 UNIT/ML ~~LOC~~ SOLN: 0.0000 [IU] | Freq: Every day | SUBCUTANEOUS | Status: DC

## 2015-02-25 MED ORDER — CHLORHEXIDINE GLUCONATE CLOTH 2 % EX PADS
6.0000 | MEDICATED_PAD | Freq: Every day | CUTANEOUS | Status: DC
  Administered 2015-02-26 – 2015-02-28 (×2): 6 via TOPICAL

## 2015-02-25 MED ORDER — VITAMIN B-1 100 MG PO TABS
100.0000 mg | ORAL_TABLET | Freq: Every day | ORAL | Status: DC
  Administered 2015-02-26 – 2015-03-02 (×5): 100 mg via ORAL
  Filled 2015-02-25 (×6): qty 1

## 2015-02-25 MED ORDER — SODIUM CHLORIDE 0.9 % IV SOLN
INTRAVENOUS | Status: AC
  Administered 2015-02-25: 22:00:00 via INTRAVENOUS
  Administered 2015-02-26: 1 mL via INTRAVENOUS

## 2015-02-25 MED ORDER — INSULIN ASPART 100 UNIT/ML ~~LOC~~ SOLN
0.0000 [IU] | Freq: Three times a day (TID) | SUBCUTANEOUS | Status: DC
  Administered 2015-02-26 – 2015-03-01 (×8): 1 [IU] via SUBCUTANEOUS

## 2015-02-25 MED ORDER — MUPIROCIN 2 % EX OINT
1.0000 "application " | TOPICAL_OINTMENT | Freq: Two times a day (BID) | CUTANEOUS | Status: AC
  Administered 2015-02-25 – 2015-03-02 (×10): 1 via NASAL
  Filled 2015-02-25 (×2): qty 22

## 2015-02-25 MED ORDER — DEXTROSE 5 % IV SOLN
5.0000 mg/h | INTRAVENOUS | Status: DC
  Administered 2015-02-25 – 2015-02-26 (×2): 10 mg/h via INTRAVENOUS
  Filled 2015-02-25 (×2): qty 100

## 2015-02-25 NOTE — ED Notes (Signed)
Patient undressed, in gown, on monitor, continuous pulse oximetry and blood pressure cuff 

## 2015-02-25 NOTE — ED Notes (Signed)
Received pt from triage in wheelchair, pt was pale, diaphoretic, required 6 people to assist in transfer from w/c to bed. Pt c/o right knee and hip pain. States his mother called 89 because he fell and was unable to stand. Pt lives with mother. States he has no regular doctor, no past med hx. 2+ bilat pitting edema.

## 2015-02-25 NOTE — Consult Note (Signed)
Reason for Consult:   AF with RVR, hypotension  Requesting Physician: ED Primary Cardiologist Dr Radford Pax  HPI:   64 y/o obese AA male with a history of AF, ETOH CM, pancytopenia, acholic hepatitis, DJD, and H.Pylori gastritis in may 2015. He is admitted now after he "fell" at home. The pt says this is because of Rt hip pain. He was noted to be in AF with RVR and markedly hypotensive with diaphoresis. He responded to IV fluid bolus in the ED. He remains in AF with VR-120.  We saw him in consult in May 2015 with a similar presentation. He was found to have a cardiomyopathy with global HK-EF30%. Myoview then low risk. He is non complaint. We did not feel he was a candidate for Amiodarone secondary to elevated LFTs. He was not a candidate for other antiarrhythmics secondary to CHF and not a candidate for anticoagulation. He is non compliant- on no medications. He is still drinking beer daily. He lives with his mother.   PMHx:  Past Medical History  Diagnosis Date  . Obesity   . Arthritis   . Hypertension   . Chronic systolic CHF (congestive heart failure), NYHA class 2 ([place])   . DCM (dilated cardiomyopathy) ([place])     EF 30% by echo 07/2013 with moderate RV dysfunction and moderate MR  . Pancytopenia ([place])   . ETOH abuse   . Alcoholic liver disease ([place])   . Gastroduodenitis     H Pylori positive  . Chronic atrial fibrillation ([name])     no an anticoagulation canditate due to alcohol abuse, liver cirrhosis with pancytopenia, medical noncompliance  . Diverticulosis of colon   . Portal hypertensive gastropathy   . Personal history of colonic polyps - adenomas 08/21/2013  . Helicobacter pylori gastritis 10/19/2013    Past Surgical History  Procedure Laterality Date  . Esophagogastroduodenoscopy N/A 08/21/2013    Procedure: ESOPHAGOGASTRODUODENOSCOPY (EGD);  Surgeon: Jerene Bears, MD;  Location: [facility] ENDOSCOPY;  Service: Endoscopy;  Laterality: N/A;  . Colonoscopy N/A 08/21/2013   Procedure: COLONOSCOPY;  Surgeon: Jerene Bears, MD;  Location: [facility] ENDOSCOPY;  Service: Endoscopy;  Laterality: N/A;    SOCHx:  reports that he has never smoked. He has never used smokeless tobacco. He reports that he drinks about 18.0 oz of alcohol per week. He reports that he does not use illicit drugs.  FAMHx: Family History  Problem Relation Age of Onset  . Hypertension Father     ALLERGIES: No Known Allergies  ROS: Review of Systems: General: negative for chills, fever, night sweats or weight changes.  Cardiovascular: negative for chest pain, dyspnea on exertion, edema, orthopnea, palpitations, paroxysmal nocturnal dyspnea or shortness of breath HEENT: negative for any visual disturbances, blindness, glaucoma Dermatological: negative for rash Respiratory: negative for cough, hemoptysis, or wheezing Urologic: negative for hematuria or dysuria Abdominal: negative for nausea, vomiting, diarrhea, bright red blood per rectum, melena, or hematemesis Neurologic: negative for visual changes, syncope, or dizziness Musculoskeletal: negative for back pain, joint pain, or swelling Psych: cooperative and appropriate All other systems reviewed and are otherwise negative except as noted above.   HOME MEDICATIONS: Prior to Admission medications   Medication Sig Start Date End Date Taking? Authorizing Provider  aspirin EC 81 MG EC tablet Take 1 tablet (81 mg total) by mouth daily. 08/24/13  Yes Geradine Girt, DO  naproxen sodium (ANAPROX) 220 MG tablet Take 440 mg by mouth daily.   Yes  Historical Provider, MD  carvedilol (COREG) 12.5 MG tablet Take 1 tablet (12.5 mg total) by mouth 2 (two) times daily with a meal. Patient not taking: Reported on 02/25/2015 09/04/13   Sueanne Margarita, MD  digoxin (LANOXIN) 0.125 MG tablet Take 1 tablet (0.125 mg total) by mouth daily. Patient not taking: Reported on 02/25/2015 09/19/13   Sueanne Margarita, MD  folic acid (FOLVITE) 1 MG tablet Take 5 tablets (5 mg  total) by mouth daily. Patient not taking: Reported on 02/25/2015 08/24/13   Geradine Girt, DO  furosemide (LASIX) 40 MG tablet Take 1 tablet (40 mg total) by mouth 2 (two) times daily. Patient not taking: Reported on 02/25/2015 09/19/13   Sueanne Margarita, MD  losartan (COZAAR) 25 MG tablet Take 1 tablet (25 mg total) by mouth daily. Patient not taking: Reported on 02/25/2015 09/19/13   Sueanne Margarita, MD  Multiple Vitamin (MULTIVITAMIN WITH MINERALS) TABS tablet Take 1 tablet by mouth daily. Patient not taking: Reported on 02/25/2015 08/24/13   Geradine Girt, DO  potassium chloride SA (K-DUR,KLOR-CON) 20 MEQ tablet Take 2 tablets (40 mEq total) by mouth 2 (two) times daily. Patient not taking: Reported on 02/25/2015 08/24/13   Geradine Girt, DO  thiamine 100 MG tablet Take 1 tablet (100 mg total) by mouth daily. Patient not taking: Reported on 02/25/2015 08/24/13   Geradine Girt, DO  vitamin B-12 1000 MCG tablet Take 1 tablet (1,000 mcg total) by mouth daily. Patient not taking: Reported on 02/25/2015 08/24/13   [place]: I have reviewed the patient's current medications.  VITALS: Blood pressure 100/71, pulse 38, resp. rate 14, SpO2 100 %.  PHYSICAL EXAM: General appearance: alert, cooperative, no distress and mildly obese Neck: no carotid bruit and no JVD Lungs: clear to auscultation bilaterally Heart: irregularly irregular rhythm Abdomen: soft, non-tender; bowel sounds normal; no masses,  no organomegaly Extremities: trace edema, chronic venous changes Pulses: diminnished pulses Skin: Skin color, texture, turgor normal. No rashes or lesions Neurologic: Grossly normal  LABS: Results for orders placed or performed during the hospital encounter of 02/25/15 (from the past 24 hour(s))  CBG monitoring, ED     Status: Abnormal   Collection Time: 02/25/15 11:38 AM  Result Value Ref Range   Glucose-Capillary 155 (H) 65 - 99 mg/dL  Brain natriuretic peptide     Status:  Abnormal   Collection Time: 02/25/15 11:50 AM  Result Value Ref Range   B Natriuretic Peptide 236.2 (H) 0.0 - 100.0 pg/mL  I-Stat Troponin, ED (not at [facility])     Status: None   Collection Time: 02/25/15 12:32 PM  Result Value Ref Range   Troponin i, poc 0.00 0.00 - 0.08 ng/mL   Comment 3          I-Stat CG4 Lactic Acid, ED     Status: Abnormal   Collection Time: 02/25/15 12:34 PM  Result Value Ref Range   Lactic Acid, Venous 13.35 (HH) 0.5 - 2.0 mmol/L   Comment NOTIFIED PHYSICIAN   Comprehensive metabolic panel     Status: Abnormal   Collection Time: 02/25/15 12:55 PM  Result Value Ref Range   Sodium 126 (L) 135 - 145 mmol/L   Potassium 4.1 3.5 - 5.1 mmol/L   Chloride 88 (L) 101 - 111 mmol/L   CO2 19 (L) 22 - 32 mmol/L   Glucose, Bld 137 (H) 65 - 99 mg/dL   BUN 11 6 -  20 mg/dL   Creatinine, Ser 1.26 (H) 0.61 - 1.24 mg/dL   Calcium 8.5 (L) 8.9 - 10.3 mg/dL   Total Protein 5.6 (L) 6.5 - 8.1 g/dL   Albumin 3.0 (L) 3.5 - 5.0 g/dL   AST 99 (H) 15 - 41 U/L   ALT 25 17 - 63 U/L   Alkaline Phosphatase 63 38 - 126 U/L   Total Bilirubin 4.3 (H) 0.3 - 1.2 mg/dL   GFR calc non Af Amer 59 (L) >60 mL/min   GFR calc Af Amer >60 >60 mL/min   Anion gap 19 (H) 5 - 15  CBC with Differential     Status: Abnormal   Collection Time: 02/25/15  1:53 PM  Result Value Ref Range   WBC 6.7 4.0 - 10.5 K/uL   RBC 2.31 (L) 4.22 - 5.81 MIL/uL   Hemoglobin 8.9 (L) 13.0 - 17.0 g/dL   HCT 25.1 (L) 39.0 - 52.0 %   MCV 108.7 (H) 78.0 - 100.0 fL   MCH 38.5 (H) 26.0 - 34.0 pg   MCHC 35.5 30.0 - 36.0 g/dL   RDW 19.7 (H) 11.5 - 15.5 %   Platelets 96 (L) 150 - 400 K/uL   Neutrophils Relative % 89 %   Neutro Abs 5.9 1.7 - 7.7 K/uL   Lymphocytes Relative 5 %   Lymphs Abs 0.3 (L) 0.7 - 4.0 K/uL   Monocytes Relative 6 %   Monocytes Absolute 0.4 0.1 - 1.0 K/uL   Eosinophils Relative 0 %   Eosinophils Absolute 0.0 0.0 - 0.7 K/uL   Basophils Relative 0 %   Basophils Absolute 0.0 0.0 - 0.1 K/uL    EKG: AF  with RVR  IMAGING: Dg Chest Portable 1 View  02/25/2015  CLINICAL DATA:  Pain following fall.  Hypertension EXAM: PORTABLE CHEST 1 VIEW COMPARISON:  Chest radiograph August 17, 2013; chest CT Aug 18, 2013 FINDINGS: A small portion of the lateral left base is not visualized. Visualize lungs are clear. Heart is upper normal in size with pulmonary vascularity within normal limits. No adenopathy. No apparent pneumothorax. No bone lesions apparent. IMPRESSION: No edema or consolidation. Electronically Signed   By: Lowella Grip III M.D.   On: 02/25/2015 12:46   Dg Knee Complete 4 Views Right  02/25/2015  CLINICAL DATA:  Chronic pain EXAM: RIGHT KNEE - COMPLETE 4+ VIEW COMPARISON:  None. FINDINGS: Frontal, lateral, and bilateral oblique views were obtained. There is no fracture or dislocation. There is a small joint effusion. There is fairly marked narrowing of the patellofemoral joint. There is moderate narrowing medially. There is spurring in all compartments. There is extensive chondrocalcinosis. IMPRESSION: No fracture. Small joint effusion. Extensive osteoarthritic change. There is chondrocalcinosis. Chondrocalcinosis may be seen with osteoarthritis but also may be seen with calcium pyrophosphate deposition disease. Electronically Signed   By: Lowella Grip III M.D.   On: 02/25/2015 14:43   Dg Hip Unilat With Pelvis 2-3 Views Right  02/25/2015  CLINICAL DATA:  Chronic hip pain EXAM: DG HIP (WITH OR WITHOUT PELVIS) 2-3V RIGHT COMPARISON:  None. FINDINGS: Frontal pelvis as well as frontal and lateral right hip images were obtained. 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 There is also extensive synovial chondromatosis in the right hip joint region. There is moderate osteoarthritic change in the left hip joint without remodeling of the femoral head. There is extensive bony overgrowth in the pubic symphysis region. There  is  degenerative change in the visualize lower lumbar spine. No acute fracture or dislocation apparent. 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 Extensive synovial chondromatosis also present in the right hip joint region. Moderate osteoarthritic changes noted in the left hip joint without remodeling of the femoral head. Bony overgrowth is noted in the pubic symphysis region. Degenerative change in visualized lumbar spine. No acute fracture or dislocation. Electronically Signed   By: Lowella Grip III M.D.   On: 02/25/2015 14:45    IMPRESSION: Principal Problem:   Hypotensive episode Active Problems:   Atrial fibrillation with RVR ([name])   Chronic atrial fibrillation ([name])   ETOH abuse   DCM (dilated cardiomyopathy) ([name])   Chronic systolic CHF (congestive heart failure), NYHA class 2 ([name])   Non compliance w medication regimen   Lactic acid increased   Pancytopenia ([name])   Alcoholic liver disease ([name])   Helicobacter pylori [id number]   Right hip pain   RECOMMENDATION: Agree with IV Diltiazem ordered in ED. B/P currently 108/76. Watch for CHF. No need for full anticoagulation from cardiology standpoint since he is not felt to be a candidate for anticoagulation as an OP Work up for DJD, elevated lactic acid, pancytopenia, and alcoholism per primary service. Would not order further Troponin.   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 He admits to ETOH abuse now and in the past. He hasn't taken any of his meds. H/O gastritis in past as well. He tripped today and hurt his knee and hip (not syncope) and was brought to the ER mildly hypotensive with AFIB with RVR 150-160. He is otherwise asymptomatic. Exam benign. Agree  with fluid bolus, IV diltiazem for rate control transitioning to PO meds. Will follow with you.  Quay Burow 02/25/2015 4:53 PM

## 2015-02-25 NOTE — ED Notes (Signed)
Dyanne Carrel NP at bedside. Verbal order for 500 cc bolus received and initiated.

## 2015-02-25 NOTE — ED Notes (Signed)
Cards at bedside

## 2015-02-25 NOTE — ED Notes (Signed)
Pt triaged and pt states he fell getting into care.  Pt in wheelchair, found diaphetic, BP 68/15 and HR 150s in afib.

## 2015-02-25 NOTE — ED Notes (Signed)
CBG 155 

## 2015-02-25 NOTE — H&P (Signed)
[facility] History and Physical  Jaeveon Ashland HLK:[id number] DOB: [birth year] DOA: 02/25/2015  Referring physician: Audie Pinto PCP: No PCP Per Patient none per patient  Chief Complaint: fall/afibwith rvr/ right hip pain  HPI: Kortney Schoenfelder is a 64 y.o. male past medical history that includes A. fib not on anticoagulation due to thrombocytopenia, EtOH abuse and noncompliance obesity, arthritis, hypertension chronic right hip pain anemia presents to the emergency department from home diaphoretic presyncope complaining of right knee and hip pain. Initial evaluation revealed A. fib with RVR, metabolic acidosis, hypotension. Information obtained from the patient and the chart. He states that he has chronic pain in his right hip and right knee makes it difficult to bear weight and has frequent falls. He also reports he drinks 4-6 24 ounce beers daily. This morning he was having worsening right hip pain and fell. He denies hitting his head. He denies chest pain palpitation shortness of breath nausea vomiting diarrhea constipation. He denies fever chills dysuria hematuria frequency or urgency. He reports having run out of medications several months ago. He confirms not having taken digitoxin, Coreg, Lasix, Cozaar in the last several weeks. Workup in the emergency department reveals A. fib with RVR, BNP 236, initial troponin negative, Actiq acid 13.35, comprehensive metabolic panel significant for sodium of 126 chloride 88 CO2 19 creatinine 1.26, please blood count hemoglobin 8.9 MCV 108 platelets 96.  Upon presentation he was somewhat lethargic afebrile systolic blood pressure 68 her rate 150 EKG with A. fib and rapid ventricular response. X-ray of his right knee and right hip/pelvis without acute fracture. In the emergency department he received IV fluids and Cardizem drip was initiated. Second lactic acid 6.02. Review of Systems:  10 point review of systems complete and all systems are negative  except as indicated in the history of present illness  Past Medical History  Diagnosis Date  . Obesity   . Arthritis   . Hypertension   . Chronic systolic CHF (congestive heart failure), NYHA class 2 ([place])   . DCM (dilated cardiomyopathy) ([place])     EF 30% by echo 07/2013 with moderate RV dysfunction and moderate MR  . Pancytopenia ([place])   . ETOH abuse   . Alcoholic liver disease ([place])   . Gastroduodenitis     H Pylori positive  . Chronic atrial fibrillation ([name])     no an anticoagulation canditate due to alcohol abuse, liver cirrhosis with pancytopenia, medical noncompliance  . Diverticulosis of colon   . Portal hypertensive gastropathy   . Personal history of colonic polyps - adenomas 08/21/2013  . Helicobacter pylori gastritis 10/19/2013   Past Surgical History  Procedure Laterality Date  . Esophagogastroduodenoscopy N/A 08/21/2013    Procedure: ESOPHAGOGASTRODUODENOSCOPY (EGD);  Surgeon: Jerene Bears, MD;  Location: [facility] ENDOSCOPY;  Service: Endoscopy;  Laterality: N/A;  . Colonoscopy N/A 08/21/2013    Procedure: COLONOSCOPY;  Surgeon: Jerene Bears, MD;  Location: [facility] ENDOSCOPY;  Service: Endoscopy;  Laterality: N/A;   Social History:  reports that he has never smoked. He has never used smokeless tobacco. He reports that he drinks about 18.0 oz of alcohol per week. He reports that he does not use illicit drugs.  No Known Allergies  Family History  Problem Relation Age of Onset  . Hypertension Father      Prior to Admission medications   Medication Sig Start Date End Date Taking? Authorizing Provider  aspirin EC 81 MG EC tablet Take 1 tablet (81 mg total) by mouth daily. 08/24/13  Yes Geradine Girt, DO  naproxen sodium (ANAPROX) 220 MG tablet Take 440 mg by mouth daily.   Yes Historical Provider, MD  carvedilol (COREG) 12.5 MG tablet Take 1 tablet (12.5 mg total) by mouth 2 (two) times daily with a meal. Patient not taking: Reported on 02/25/2015 09/04/13   Sueanne Margarita, MD  digoxin  (LANOXIN) 0.125 MG tablet Take 1 tablet (0.125 mg total) by mouth daily. Patient not taking: Reported on 02/25/2015 09/19/13   Sueanne Margarita, MD  folic acid (FOLVITE) 1 MG tablet Take 5 tablets (5 mg total) by mouth daily. Patient not taking: Reported on 02/25/2015 08/24/13   Geradine Girt, DO  furosemide (LASIX) 40 MG tablet Take 1 tablet (40 mg total) by mouth 2 (two) times daily. Patient not taking: Reported on 02/25/2015 09/19/13   Sueanne Margarita, MD  losartan (COZAAR) 25 MG tablet Take 1 tablet (25 mg total) by mouth daily. Patient not taking: Reported on 02/25/2015 09/19/13   Sueanne Margarita, MD  Multiple Vitamin (MULTIVITAMIN WITH MINERALS) TABS tablet Take 1 tablet by mouth daily. Patient not taking: Reported on 02/25/2015 08/24/13   Geradine Girt, DO  potassium chloride SA (K-DUR,KLOR-CON) 20 MEQ tablet Take 2 tablets (40 mEq total) by mouth 2 (two) times daily. Patient not taking: Reported on 02/25/2015 08/24/13   Geradine Girt, DO  thiamine 100 MG tablet Take 1 tablet (100 mg total) by mouth daily. Patient not taking: Reported on 02/25/2015 08/24/13   Geradine Girt, DO  vitamin B-12 1000 MCG tablet Take 1 tablet (1,000 mcg total) by mouth daily. Patient not taking: Reported on 02/25/2015 08/24/13   Geradine Girt, DO   Physical Exam: Filed Vitals:   02/25/15 1445 02/25/15 1515 02/25/15 1530 02/25/15 1545  BP: 110/69 100/71 97/77 109/69  Pulse: 52 38 138 45  Resp: 14 14 13 14   SpO2: 87% 100% 100% 100%    Wt Readings from Last 3 Encounters:  10/18/13 133.074 kg (293 lb 6 oz)  09/12/13 128.277 kg (282 lb 12.8 oz)  09/04/13 127.914 kg (282 lb)    General:  Appears calm and comfortable Eyes: PERRL, normal lids, irises & conjunctiva ENT: grossly normal hearing, lips & tongueMucous membranes of his mouth are dry pink  Neck: no LAD, masses or thyromegaly Cardiovascular: Irregularly irregular  no m/r/g. No LE edema.  Respiratory: CTA bilaterally, no w/r/r. Normal respiratory effort. Abdomen:  soft, ntnd obese  Skin: no rash or induration seen on limited exam Musculoskeletal: grossly normal tone BUE/BLE Psychiatric: grossly normal mood and affect, speech fluent and appropriate Neurologic: grossly non-focal.           Labs on Admission:  Basic Metabolic Panel:  Recent Labs Lab 02/25/15 1255  NA 126*  K 4.1  CL 88*  CO2 19*  GLUCOSE 137*  BUN 11  CREATININE 1.26*  CALCIUM 8.5*   Liver Function Tests:  Recent Labs Lab 02/25/15 1255  AST 99*  ALT 25  ALKPHOS 63  BILITOT 4.3*  PROT 5.6*  ALBUMIN 3.0*   No results for input(s): LIPASE, AMYLASE in the last 168 hours. No results for input(s): AMMONIA in the last 168 hours. CBC:  Recent Labs Lab 02/25/15 1353  WBC 6.7  NEUTROABS 5.9  HGB 8.9*  HCT 25.1*  MCV 108.7*  PLT 96*   Cardiac Enzymes: No results for input(s): CKTOTAL, CKMB, CKMBINDEX, TROPONINI in the last 168 hours.  BNP (last 3 results)  Recent Labs  02/25/15  1150  BNP 236.2*    ProBNP (last 3 results) No results for input(s): PROBNP in the last 8760 hours.  CBG:  Recent Labs Lab 02/25/15 1138  GLUCAP 155*    Radiological Exams on Admission: Dg Chest Portable 1 View  02/25/2015  CLINICAL DATA:  Pain following fall.  Hypertension EXAM: PORTABLE CHEST 1 VIEW COMPARISON:  Chest radiograph August 17, 2013; chest CT Aug 18, 2013 FINDINGS: A small portion of the lateral left base is not visualized. Visualize lungs are clear. Heart is upper normal in size with pulmonary vascularity within normal limits. No adenopathy. No apparent pneumothorax. No bone lesions apparent. IMPRESSION: No edema or consolidation. Electronically Signed   By: Lowella Grip III M.D.   On: 02/25/2015 12:46   Dg Knee Complete 4 Views Right  02/25/2015  CLINICAL DATA:  Chronic pain EXAM: RIGHT KNEE - COMPLETE 4+ VIEW COMPARISON:  None. FINDINGS: Frontal, lateral, and bilateral oblique views were obtained. There is no fracture or dislocation. There is a small  joint effusion. There is fairly marked narrowing of the patellofemoral joint. There is moderate narrowing medially. There is spurring in all compartments. There is extensive chondrocalcinosis. IMPRESSION: No fracture. Small joint effusion. Extensive osteoarthritic change. There is chondrocalcinosis. Chondrocalcinosis may be seen with osteoarthritis but also may be seen with calcium pyrophosphate deposition disease. Electronically Signed   By: Lowella Grip III M.D.   On: 02/25/2015 14:43   Dg Hip Unilat With Pelvis 2-3 Views Right  02/25/2015  CLINICAL DATA:  Chronic hip pain EXAM: DG HIP (WITH OR WITHOUT PELVIS) 2-3V RIGHT COMPARISON:  None. FINDINGS: Frontal pelvis as well as frontal and lateral right hip images were obtained. 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 There is also extensive synovial chondromatosis in the right hip joint region. There is moderate osteoarthritic change in the left hip joint without remodeling of the femoral head. There is extensive bony overgrowth in the pubic symphysis region. There is degenerative change in the visualize lower lumbar spine. No acute fracture or dislocation apparent. 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 Extensive synovial chondromatosis also present in the right hip joint region. Moderate osteoarthritic changes noted in the left hip joint without remodeling of the femoral head. Bony overgrowth is noted in the pubic symphysis region. Degenerative change in visualized lumbar spine. No acute fracture or dislocation. Electronically Signed   By: Lowella Grip III M.D.   On: 02/25/2015 14:45    EKG: Independently reviewed see above  Assessment/Plan Principal Problem:   Atrial fibrillation with RVR ([place]): History of same. Likely related to major noncompliance with medications. Will admit to step down.  Will continue Cardizem drip. Home medications include Coreg Cozaar dig. Cardiology consult requested by ED physician. Will defer to cards regarding rate control meds.  Active Problems:   Hypotensive episode/Metabolic acidosis/Hyponatremia: Related to #1 in the setting of EtOH abuse. Improved with IV fluids but remained somewhat soft given his Cardizem drip. Gently hydrate cycle lactic acid. Repeat bmet in am. At the time of admission blood pressure 108/80 heart rate of 120. Check urine drug screen and EtOH level.    ETOH abuse: Drinks 3-4 24 ounce beers daily. Will implement CIWA protocol.    Alcoholic liver disease ([place]): Appears stable at baseline. Will monitor    Chronic systolic CHF (congestive heart failure), NYHA class 2 [facility]): Cardiology [facility]. Chart review  indicates last visit last year. He reports noncompliance with medications. Home medications include losartan/Coreg/damage/Lasix. Appears slightly dry on admission. Chart review indicates EF 30% with moderate RV dysfunction. Consider repeat echo. Defer to cardiology. All presumed secondary to EtOH    Non compliance w medication regimen: Case management consult     Right hip pain: no fx per xray. Osteo. Will request PT eval. Consider ortho eval.      Acute kidney injury ([place]): likely related to above. Will gently hydrate hold nephrotoxins monitor urine output and recheck in the morning. If no improvement consider renal ultrasound    Anemia/thrombocytosis: Likely related to #3. Chart review indicates workup with GI last year. Had EGD and colonoscopy revealing Portal hypertensive gastropathy was found in the gastric fundus and gastric body. Acute gastritis (inflammation) was found in the gastric antrum.Small hiatal hernia. Duodenal inflammation was found in the duodenal bulb. Will obtain FOBT. Platelet count close to baseline. No obvious signs bleeding.     cardiology  Code Status: full DVT Prophylaxis: Family Communication: none  present Disposition Plan: home when ready  Time spent: 86 minutes  [facility]

## 2015-02-25 NOTE — ED Notes (Signed)
Received report from ptar, states pt was walking to his car and right knee gave out on him, hx of right knee problems x 9 years and no other complaints from the fall.

## 2015-02-25 NOTE — ED Provider Notes (Signed)
CSN: [id number]     Arrival date & time 02/25/15  1050 History   First MD Initiated Contact with Patient 02/25/15 1224     Chief Complaint  Patient presents with  . Fall     HPI Received pt from triage in wheelchair, pt was pale, diaphoretic, required 6 people to assist in transfer from w/c to bed. Pt c/o right knee and hip pain. States his mother called 15 because he fell and was unable to stand. Pt lives with mother. States he has no regular doctor, no past med hx. 2+ bilat pitting edema.  Past Medical History  Diagnosis Date  . Obesity   . Arthritis   . Hypertension   . Chronic systolic CHF (congestive heart failure), NYHA class 2 ([place])   . DCM (dilated cardiomyopathy) ([place])     EF 30% by echo 07/2013 with moderate RV dysfunction and moderate MR  . Pancytopenia ([place])   . ETOH abuse   . Alcoholic liver disease ([place])   . Gastroduodenitis     H Pylori positive  . Chronic atrial fibrillation ([name])     no an anticoagulation canditate due to alcohol abuse, liver cirrhosis with pancytopenia, medical noncompliance  . Diverticulosis of colon   . Portal hypertensive gastropathy   . Personal history of colonic polyps - adenomas 08/21/2013  . Helicobacter pylori gastritis 10/19/2013   Past Surgical History  Procedure Laterality Date  . Esophagogastroduodenoscopy N/A 08/21/2013    Procedure: ESOPHAGOGASTRODUODENOSCOPY (EGD);  Surgeon: Jerene Bears, MD;  Location[facility] ENDOSCOPY;  Service: Endoscopy;  Laterality: N/A;  . Colonoscopy N/A 08/21/2013    Procedure: COLONOSCOPY;  Surgeon: Jerene Bears, MD;  Location: [facility] ENDOSCOPY;  Service: Endoscopy;  Laterality: N/A;   Family History  Problem Relation Age of Onset  . Hypertension Father    Social History  Substance Use Topics  . Smoking status: Never Smoker   . Smokeless tobacco: Never Used  . Alcohol Use: 18.0 oz/week    30 Cans of beer per week     Comment: daily    Review of Systems  Cardiovascular: Negative for chest pain.  All other  systems reviewed and are negative.     Allergies  Review of patient's allergies indicates no known allergies.  Home Medications   Prior to Admission medications   Medication Sig Start Date End Date Taking? Authorizing Provider  aspirin EC 81 MG EC tablet Take 1 tablet (81 mg total) by mouth daily. 08/24/13  Yes Geradine Girt, DO  naproxen sodium (ANAPROX) 220 MG tablet Take 440 mg by mouth daily.   Yes Historical Provider, MD  carvedilol (COREG) 12.5 MG tablet Take 1 tablet (12.5 mg total) by mouth 2 (two) times daily with a meal. Patient not taking: Reported on 02/25/2015 09/04/13   Sueanne Margarita, MD  digoxin (LANOXIN) 0.125 MG tablet Take 1 tablet (0.125 mg total) by mouth daily. Patient not taking: Reported on 02/25/2015 09/19/13   Sueanne Margarita, MD  folic acid (FOLVITE) 1 MG tablet Take 5 tablets (5 mg total) by mouth daily. Patient not taking: Reported on 02/25/2015 08/24/13   Geradine Girt, DO  furosemide (LASIX) 40 MG tablet Take 1 tablet (40 mg total) by mouth 2 (two) times daily. Patient not taking: Reported on 02/25/2015 09/19/13   Sueanne Margarita, MD  losartan (COZAAR) 25 MG tablet Take 1 tablet (25 mg total) by mouth daily. Patient not taking: Reported on 02/25/2015 09/19/13   Sueanne Margarita, MD  Multiple Vitamin (MULTIVITAMIN WITH MINERALS) TABS tablet Take 1 tablet by mouth daily. Patient not taking: Reported on 02/25/2015 08/24/13   Geradine Girt, DO  potassium chloride SA (K-DUR,KLOR-CON) 20 MEQ tablet Take 2 tablets (40 mEq total) by mouth 2 (two) times daily. Patient not taking: Reported on 02/25/2015 08/24/13   Geradine Girt, DO  thiamine 100 MG tablet Take 1 tablet (100 mg total) by mouth daily. Patient not taking: Reported on 02/25/2015 08/24/13   Geradine Girt, DO  vitamin B-12 1000 MCG tablet Take 1 tablet (1,000 mcg total) by mouth daily. Patient not taking: Reported on 02/25/2015 08/24/13   Tomi Bamberger Vann, DO   BP 120/79 mmHg  Pulse 80  Temp(Src) 98.6 F (37 C) (Oral)  Resp 19   Ht 6\' 1"  (1.854 m)  Wt 278 lb 10.6 oz (126.4 kg)  BMI 36.77 kg/m2  SpO2 94% Physical Exam  Constitutional: He is oriented to person, place, and time. He appears well-developed and well-nourished. No distress.  HENT:  Head: Normocephalic and atraumatic.  Eyes: Pupils are equal, round, and reactive to light.  Neck: Normal range of motion.  Cardiovascular: Intact distal pulses.  An irregularly irregular rhythm present. Tachycardia present.   Pulmonary/Chest: No respiratory distress.  Abdominal: Normal appearance. He exhibits no distension.  Musculoskeletal: Normal range of motion.  Neurological: He is alert and oriented to person, place, and time. No cranial nerve deficit.  Skin: Skin is warm. No rash noted. He is diaphoretic.  Psychiatric: He has a normal mood and affect. His behavior is normal.  Nursing note and vitals reviewed.   ED Course  Procedures (including critical care time)  Medications  multivitamin with minerals tablet 1 tablet (1 tablet Oral Given 02/26/15 0854)  thiamine (VITAMIN B-1) tablet 100 mg (100 mg Oral Given 02/26/15 0854)  vitamin B-12 (CYANOCOBALAMIN) tablet 1,000 mcg (1,000 mcg Oral Given 02/26/15 0953)  acetaminophen (TYLENOL) tablet 650 mg (not administered)  ondansetron (ZOFRAN) injection 4 mg (not administered)  insulin aspart (novoLOG) injection 0-9 Units (1 Units Subcutaneous Given 02/26/15 1733)  insulin aspart (novoLOG) injection 0-5 Units (0 Units Subcutaneous Not Given 02/26/15 2200)  0.9 %  sodium chloride infusion (1 mL Intravenous New Bag/Given 02/26/15 0521)  mupirocin ointment (BACTROBAN) 2 % 1 application (1 application Nasal Given 02/26/15 2222)  Chlorhexidine Gluconate Cloth 2 % PADS 6 each (6 each Topical Given 02/27/15 0527)  diltiazem (CARDIZEM) tablet 30 mg (30 mg Oral Given 02/27/15 0527)  furosemide (LASIX) tablet 40 mg (40 mg Oral Given 02/26/15 1733)  carvedilol (COREG) tablet 12.5 mg (12.5 mg Oral Given 02/26/15 1733)  0.9 %  sodium  chloride infusion (not administered)  LORazepam (ATIVAN) injection 2-3 mg (not administered)  folic acid (FOLVITE) tablet 1 mg (0 mg Oral Duplicate 89/3/81 0175)  diltiazem (CARDIZEM) 100 mg in dextrose 5 % 100 mL (1 mg/mL) infusion (0 mg/hr Intravenous Stopped 02/25/15 1630)  sodium chloride 0.9 % bolus 500 mL (500 mLs Intravenous Transfusing/Transfer 02/25/15 1613)  sodium chloride 0.9 % bolus 500 mL (500 mLs Intravenous Given 02/25/15 2319)  sodium chloride 0.9 % bolus 250 mL (250 mLs Intravenous Given 02/26/15 0355)  potassium chloride (K-DUR,KLOR-CON) CR tablet 50 mEq (50 mEq Oral Given 02/26/15 0854)  furosemide (LASIX) injection 20 mg (20 mg Intravenous Given 02/26/15 1733)    Cardiology consulted CRITICAL CARE Performed by: Leonard Schwartz L Total critical care time: 30  minutes Critical care time was exclusive of separately billable procedures and treating other patients. Critical care  was necessary to treat or prevent imminent or life-threatening deterioration. Critical care was time spent personally by me on the following activities: development of treatment plan with patient and/or surrogate as well as nursing, discussions with consultants, evaluation of patient's response to treatment, examination of patient, obtaining history from patient or surrogate, ordering and performing treatments and interventions, ordering and review of laboratory studies, ordering and review of radiographic studies, pulse oximetry and re-evaluation of patient's condition.  Labs Review Labs Reviewed  MRSA PCR SCREENING - Abnormal; Notable for the following:    MRSA by PCR POSITIVE (*)    All other components within normal limits  BRAIN NATRIURETIC PEPTIDE - Abnormal; Notable for the following:    B Natriuretic Peptide 236.2 (*)    All other components within normal limits  COMPREHENSIVE METABOLIC PANEL - Abnormal; Notable for the following:    Sodium 126 (*)    Chloride 88 (*)    CO2 19 (*)    Glucose,  Bld 137 (*)    Creatinine, Ser 1.26 (*)    Calcium 8.5 (*)    Total Protein 5.6 (*)    Albumin 3.0 (*)    AST 99 (*)    Total Bilirubin 4.3 (*)    GFR calc non Af Amer 59 (*)    Anion gap 19 (*)    All other components within normal limits  CBC WITH DIFFERENTIAL/PLATELET - Abnormal; Notable for the following:    RBC 2.31 (*)    Hemoglobin 8.9 (*)    HCT 25.1 (*)    MCV 108.7 (*)    MCH 38.5 (*)    RDW 19.7 (*)    Platelets 96 (*)    Lymphs Abs 0.3 (*)    All other components within normal limits  FOLATE RBC - Abnormal; Notable for the following:    Hematocrit 24.0 (*)    All other components within normal limits  LACTIC ACID, PLASMA - Abnormal; Notable for the following:    Lactic Acid, Venous 4.2 (*)    All other components within normal limits  BASIC METABOLIC PANEL - Abnormal; Notable for the following:    Sodium 126 (*)    Chloride 89 (*)    Glucose, Bld 126 (*)    Calcium 8.5 (*)    All other components within normal limits  GLUCOSE, CAPILLARY - Abnormal; Notable for the following:    Glucose-Capillary 129 (*)    All other components within normal limits  LACTIC ACID, PLASMA - Abnormal; Notable for the following:    Lactic Acid, Venous 3.5 (*)    All other components within normal limits  GLUCOSE, CAPILLARY - Abnormal; Notable for the following:    Glucose-Capillary 138 (*)    All other components within normal limits  LACTIC ACID, PLASMA - Abnormal; Notable for the following:    Lactic Acid, Venous 2.4 (*)    All other components within normal limits  BASIC METABOLIC PANEL - Abnormal; Notable for the following:    Sodium 127 (*)    Potassium 3.3 (*)    Chloride 92 (*)    Glucose, Bld 126 (*)    Calcium 8.0 (*)    All other components within normal limits  CBC - Abnormal; Notable for the following:    RBC 2.00 (*)    Hemoglobin 7.5 (*)    HCT 21.0 (*)    MCV 105.0 (*)    MCH 37.5 (*)    RDW 19.7 (*)    Platelets  119 (*)    All other components within  normal limits  GLUCOSE, CAPILLARY - Abnormal; Notable for the following:    Glucose-Capillary 137 (*)    All other components within normal limits  GLUCOSE, CAPILLARY - Abnormal; Notable for the following:    Glucose-Capillary 127 (*)    All other components within normal limits  GLUCOSE, CAPILLARY - Abnormal; Notable for the following:    Glucose-Capillary 121 (*)    All other components within normal limits  COMPREHENSIVE METABOLIC PANEL - Abnormal; Notable for the following:    Sodium 128 (*)    Chloride 92 (*)    Glucose, Bld 119 (*)    Calcium 8.1 (*)    Total Protein 5.1 (*)    Albumin 2.5 (*)    AST 103 (*)    Total Bilirubin 3.5 (*)    All other components within normal limits  CBC WITH DIFFERENTIAL/PLATELET - Abnormal; Notable for the following:    RBC 2.38 (*)    Hemoglobin 8.4 (*)    HCT 23.7 (*)    MCH 35.3 (*)    RDW 22.2 (*)    Platelets 113 (*)    All other components within normal limits  GLUCOSE, CAPILLARY - Abnormal; Notable for the following:    Glucose-Capillary 126 (*)    All other components within normal limits  CBG MONITORING, ED - Abnormal; Notable for the following:    Glucose-Capillary 155 (*)    All other components within normal limits  I-STAT CG4 LACTIC ACID, ED - Abnormal; Notable for the following:    Lactic Acid, Venous 13.35 (*)    All other components within normal limits  I-STAT CG4 LACTIC ACID, ED - Abnormal; Notable for the following:    Lactic Acid, Venous 6.02 (*)    All other components within normal limits  CULTURE, BLOOD (ROUTINE X 2)  CULTURE, BLOOD (ROUTINE X 2)  ETHANOL  VITAMIN B12  TSH  LACTIC ACID, PLASMA  POTASSIUM  MAGNESIUM  URINE RAPID DRUG SCREEN, HOSP PERFORMED  MAGNESIUM  I-STAT TROPOININ, ED  TYPE AND SCREEN  PREPARE RBC (CROSSMATCH)    Imaging Review Dg Chest Portable 1 View  02/25/2015  CLINICAL DATA:  Pain following fall.  Hypertension EXAM: PORTABLE CHEST 1 VIEW COMPARISON:  Chest radiograph August 17, 2013; chest CT Aug 18, 2013 FINDINGS: A small portion of the lateral left base is not visualized. Visualize lungs are clear. Heart is upper normal in size with pulmonary vascularity within normal limits. No adenopathy. No apparent pneumothorax. No bone lesions apparent. IMPRESSION: No edema or consolidation. Electronically Signed   By: Lowella Grip III M.D.   On: 02/25/2015 12:46   Dg Knee Complete 4 Views Right  02/25/2015  CLINICAL DATA:  Chronic pain EXAM: RIGHT KNEE - COMPLETE 4+ VIEW COMPARISON:  None. FINDINGS: Frontal, lateral, and bilateral oblique views were obtained. There is no fracture or dislocation. There is a small joint effusion. There is fairly marked narrowing of the patellofemoral joint. There is moderate narrowing medially. There is spurring in all compartments. There is extensive chondrocalcinosis. IMPRESSION: No fracture. Small joint effusion. Extensive osteoarthritic change. There is chondrocalcinosis. Chondrocalcinosis may be seen with osteoarthritis but also may be seen with calcium pyrophosphate deposition disease. Electronically Signed   By: Lowella Grip III M.D.   On: 02/25/2015 14:43   Dg Hip Unilat With Pelvis 2-3 Views Right  02/25/2015  CLINICAL DATA:  Chronic hip pain EXAM: DG HIP (WITH OR WITHOUT PELVIS)  2-3V RIGHT COMPARISON:  None. FINDINGS: Frontal pelvis as well as frontal and lateral right hip images were obtained. 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 There is also extensive synovial chondromatosis in the right hip joint region. There is moderate osteoarthritic change in the left hip joint without remodeling of the femoral head. There is extensive bony overgrowth in the pubic symphysis region. There is degenerative change in the visualize lower lumbar spine. No acute fracture or dislocation apparent. 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 Extensive synovial chondromatosis also present in the right hip joint region. Moderate osteoarthritic changes noted in the left hip joint without remodeling of the femoral head. Bony overgrowth is noted in the pubic symphysis region. Degenerative change in visualized lumbar spine. No acute fracture or dislocation. Electronically Signed   By: Lowella Grip III M.D.   On: 02/25/2015 14:45   I have personally reviewed and evaluated these images and lab results as part of my medical decision-making.   EKG Interpretation   Date/Time:  Monday February 25 2015 11:39:31 EST Ventricular Rate:  143 PR Interval:    QRS Duration: 74 QT Interval:  330 QTC Calculation: 509 R Axis:   11 Text Interpretation:  Atrial fibrillation with rapid ventricular response  Minimal voltage criteria for LVH, may be normal variant Nonspecific ST and  T wave abnormality Abnormal ECG Confirmed by Jerimah Witucki  MD, Ellysia Char ([id number]) on  02/25/2015 2:03:35 PM      MDM   Final diagnoses:  Hypotension, unspecified hypotension type  Atrial fibrillation with RVR ([name])        Leonard Schwartz, MD 02/27/15 [phone]

## 2015-02-26 LAB — LACTIC ACID, PLASMA
Lactic Acid, Venous: 2.4 mmol/L (ref 0.5–2.0)
Lactic Acid, Venous: 1.4 mmol/L (ref 0.5–2.0)

## 2015-02-26 LAB — CBC
HCT: 21 % — ABNORMAL LOW (ref 39.0–52.0)
Hemoglobin: 7.5 g/dL — ABNORMAL LOW (ref 13.0–17.0)
MCH: 37.5 pg — ABNORMAL HIGH (ref 26.0–34.0)
MCHC: 35.7 g/dL (ref 30.0–36.0)
MCV: 105 fL — ABNORMAL HIGH (ref 78.0–100.0)
Platelets: 119 10*3/uL — ABNORMAL LOW (ref 150–400)
RBC: 2 MIL/uL — ABNORMAL LOW (ref 4.22–5.81)
RDW: 19.7 % — ABNORMAL HIGH (ref 11.5–15.5)
WBC: 4.6 10*3/uL (ref 4.0–10.5)

## 2015-02-26 LAB — FOLATE RBC
FOLATE, HEMOLYSATE: 170.1 ng/mL
Folate, RBC: 709 ng/mL (ref >498)
HEMATOCRIT: 24 % — AB (ref 37.5–51.0)

## 2015-02-26 LAB — RAPID URINE DRUG SCREEN, HOSP PERFORMED
Amphetamines: NOT DETECTED
BARBITURATES: NOT DETECTED
Benzodiazepines: NOT DETECTED
Cocaine: NOT DETECTED
OPIATES: NOT DETECTED
TETRAHYDROCANNABINOL: NOT DETECTED

## 2015-02-26 LAB — GLUCOSE, CAPILLARY
GLUCOSE-CAPILLARY: 137 mg/dL — AB (ref 65–99)
GLUCOSE-CAPILLARY: 127 mg/dL — AB (ref 65–99)
GLUCOSE-CAPILLARY: 126 mg/dL — AB (ref 65–99)
Glucose-Capillary: 121 mg/dL — ABNORMAL HIGH (ref 65–99)

## 2015-02-26 LAB — MAGNESIUM: MAGNESIUM: 1.8 mg/dL (ref 1.7–2.4)

## 2015-02-26 LAB — BASIC METABOLIC PANEL
Anion gap: 9 (ref 5–15)
BUN: 11 mg/dL (ref 6–20)
CO2: 26 mmol/L (ref 22–32)
Calcium: 8 mg/dL — ABNORMAL LOW (ref 8.9–10.3)
Chloride: 92 mmol/L — ABNORMAL LOW (ref 101–111)
Creatinine, Ser: 0.79 mg/dL (ref 0.61–1.24)
GFR calc Af Amer: >60 mL/min (ref >60)
GFR calc non Af Amer: >60 mL/min (ref >60)
Glucose, Bld: 126 mg/dL — ABNORMAL HIGH (ref 65–99)
Potassium: 3.3 mmol/L — ABNORMAL LOW (ref 3.5–5.1)
Sodium: 127 mmol/L — ABNORMAL LOW (ref 135–145)

## 2015-02-26 LAB — PREPARE RBC (CROSSMATCH)

## 2015-02-26 LAB — POTASSIUM: POTASSIUM: 4.1 mmol/L (ref 3.5–5.1)

## 2015-02-26 MED ORDER — SODIUM CHLORIDE 0.9 % IV SOLN: Freq: Once | INTRAVENOUS | Status: DC

## 2015-02-26 MED ORDER — FUROSEMIDE 40 MG PO TABS
40.0000 mg | ORAL_TABLET | Freq: Two times a day (BID) | ORAL | Status: DC
  Administered 2015-02-26 (×2): 40 mg via ORAL
  Filled 2015-02-26 (×2): qty 1

## 2015-02-26 MED ORDER — POTASSIUM CHLORIDE CRYS ER 20 MEQ PO TBCR
50.0000 meq | EXTENDED_RELEASE_TABLET | Freq: Once | ORAL | Status: AC
  Administered 2015-02-26: 50 meq via ORAL
  Filled 2015-02-26: qty 2
  Filled 2015-02-26: qty 1

## 2015-02-26 MED ORDER — SODIUM CHLORIDE 0.9 % IV BOLUS (SEPSIS)
250.0000 mL | Freq: Once | INTRAVENOUS | Status: AC
  Administered 2015-02-26: 250 mL via INTRAVENOUS

## 2015-02-26 MED ORDER — LORAZEPAM 2 MG/ML IJ SOLN
2.0000 mg | INTRAMUSCULAR | Status: DC | PRN
Start: 2015-02-26 — End: 2015-03-01

## 2015-02-26 MED ORDER — FOLIC ACID 1 MG PO TABS
1.0000 mg | ORAL_TABLET | Freq: Every day | ORAL | Status: DC
  Administered 2015-02-27 – 2015-03-02 (×4): 1 mg via ORAL
  Filled 2015-02-26 (×4): qty 1

## 2015-02-26 MED ORDER — DILTIAZEM HCL 30 MG PO TABS
30.0000 mg | ORAL_TABLET | Freq: Four times a day (QID) | ORAL | Status: DC
  Administered 2015-02-26 – 2015-02-28 (×8): 30 mg via ORAL
  Filled 2015-02-26 (×8): qty 1

## 2015-02-26 MED ORDER — VITAMIN B-1 100 MG PO TABS: 100.0000 mg | ORAL_TABLET | Freq: Every day | ORAL | Status: DC

## 2015-02-26 MED ORDER — CARVEDILOL 12.5 MG PO TABS
12.5000 mg | ORAL_TABLET | Freq: Two times a day (BID) | ORAL | Status: DC
  Administered 2015-02-26 (×2): 12.5 mg via ORAL
  Filled 2015-02-26 (×2): qty 1

## 2015-02-26 MED ORDER — FUROSEMIDE 10 MG/ML IJ SOLN
20.0000 mg | Freq: Once | INTRAMUSCULAR | Status: AC
  Administered 2015-02-26: 20 mg via INTRAVENOUS
  Filled 2015-02-26: qty 2

## 2015-02-26 NOTE — Care Management Note (Signed)
Case Management Note  Patient Details  Name: Alver Leete MRN: [id number] Date of Birth: 09-20-1950  Subjective/Objective:         Adm w at fib           Action/Plan:lives w fam   Expected Discharge Date:                  Expected Discharge Plan:  Home/Self Care  In-House Referral:     Discharge planning Services  CM Consult  Post Acute Care Choice:    Choice offered to:     DME Arranged:    DME Agency:     HH Arranged:    [place] Agency:     Status of Service:     Medicare Important Message Given:    Date Medicare IM Given:    Medicare IM give by:    Date Additional Medicare IM Given:    Additional Medicare Important Message give by:     If discussed at [place] of Stay Meetings, dates discussed:    Additional Comments: pt needed inform on pcp. Gave pt list of [place] [facility]. The number for [facility] was given to pt. He has [employer] ins and enc him to also call customer service for list of md's participating in [employer].  Lacretia Leigh, RN 02/26/2015, 10:09 AM

## 2015-02-26 NOTE — Care Management Note (Signed)
Case Management Note  Patient Details  Name: Demitrious Mccannon MRN: [id number] Date of Birth: 07-24-1950  Subjective/Objective:    Adm w at fib  Action/Plan: lives w fam   Expected Discharge Date:                  Expected Discharge Plan:  Home/Self Care  In-House Referral:     Discharge planning Services  CM Consult  Post Acute Care Choice:    Choice offered to:     DME Arranged:    DME Agency:     HH Arranged:    [place] Agency:     Status of Service:     Medicare Important Message Given:    Date Medicare IM Given:    Medicare IM give by:    Date Additional Medicare IM Given:    Additional Medicare Important Message give by:     If discussed at [place] of Stay Meetings, dates discussed:    Additional Comments: ur review done  Lacretia Leigh, RN 02/26/2015, 7:24 AM

## 2015-02-26 NOTE — Progress Notes (Signed)
Patient's lactic acid at 2120 was 3.5. Order given for 550ml bolus of NS and repeat lactic acid. Repeat lactic acid at 0059 was 2.4. Order given for 250 bolus of NS and repeat lactic acid. Repeat lactic acid is pending. Will continue to monitor.

## 2015-02-26 NOTE — Evaluation (Signed)
Physical Therapy Evaluation Patient Details Name: Thomas [name] MRN: [id number] DOB: 04-03-1951 Today's Date: 02/26/2015   History of Present Illness  Thomas [name] is a 64 y.o. male past medical history that includes A. fib not on anticoagulation due to thrombocytopenia, EtOH abuse and noncompliance obesity, arthritis, hypertension chronic right hip pain anemia presents to the emergency department from home diaphoretic presyncope complaining of right knee and hip pain. Initial evaluation revealed A. fib with RVR, metabolic acidosis, hypotension.  Clinical Impression  Pt mobility greatly limited by irregular HR and inc HR to 180s with standing. Pt also with R hip pain and con't to report "I need a hip replacement." Pt living at home with mother who is 62 yo and does all IADLS including driving and grocery shopping. Curren'ty pt requires assist with all transfers. At this time recommend SNF upon d/c however may change once HR under control. Acute PT to con't to follow to progress as able.    Follow Up Recommendations SNF;Supervision/Assistance - 24 hour    Equipment Recommendations  Rolling walker with 5" wheels    Recommendations for Other Services       Precautions / Restrictions Precautions Precautions: Fall Restrictions Weight Bearing Restrictions: No Other Position/Activity Restrictions: pt self limiting on R LE WBing due to hip and knee pain      Mobility  Bed Mobility Overal bed mobility: Needs Assistance Bed Mobility: Supine to Sit     Supine to sit: Min assist     General bed mobility comments: minA for safety  Transfers Overall transfer level: Needs assistance Equipment used: Rolling walker (2 wheeled) Transfers: Sit to/from Stand Sit to Stand: Min assist         General transfer comment: v/c's for safey  Ambulation/Gait Ambulation/Gait assistance: Min assist;+2 safety/equipment Ambulation Distance (Feet): 10 Feet Assistive device: Rolling walker (2  wheeled) Gait Pattern/deviations: Step-through pattern;Decreased stride length;Shuffle;Wide base of support Gait velocity: slow   General Gait Details: pt HR between 150-180s with standing limiting ambulation tolerance. mild SOB, unsteady requiring max v/c's for walker safety to no push to far out in front. pt with extremely wide base of support and minimal foot clearance  Stairs            Wheelchair Mobility    Modified Rankin (Stroke Patients Only)       Balance Overall balance assessment: Needs assistance   Sitting balance-Leahy Scale: Good Sitting balance - Comments: attempted to don socks   Standing balance support: Bilateral upper extremity supported Standing balance-Leahy Scale: Poor Standing balance comment: requires UE support                             Pertinent Vitals/Pain Pain Assessment: 0-10 Pain Score: 5  Pain Location: R hip Pain Intervention(s): Monitored during session    Home Living Family/patient expects to be discharged to:: Private residence Living Arrangements: Parent Available Help at Discharge: Family;Available 24 hours/day Type of Home: House Home Access: Stairs to enter Entrance Stairs-Rails: Right Entrance Stairs-Number of Steps: 2 Home Layout: One level Home Equipment: Walker - 2 wheels;Cane - single point      Prior Function Level of Independence: Needs assistance   Gait / Transfers Assistance Needed: uses cane  ADL's / Homemaking Assistance Needed: pt baths and dresses self, mother does all IADLs  Comments: pt's mother does the grocery shopping, cooking and cleaning. pt uses a cane sometimes     Hand Dominance   Dominant  Hand: Right    Extremity/Trunk Assessment   Upper Extremity Assessment: Generalized weakness           Lower Extremity Assessment: Generalized weakness      Cervical / Trunk Assessment: Normal  Communication   Communication: No difficulties  Cognition Arousal/Alertness:  Awake/alert Behavior During Therapy: WFL for tasks assessed/performed Overall Cognitive Status: Within Functional Limits for tasks assessed                      General Comments General comments (skin integrity, edema, etc.): pt with irrregular HR >110 t/o session    Exercises        Assessment/Plan    PT Assessment Patient needs continued PT services  PT Diagnosis Difficulty walking   PT Problem List Decreased strength;Decreased activity tolerance;Decreased balance;Decreased mobility  PT Treatment Interventions DME instruction;Gait training;Stair training;Functional mobility training;Therapeutic activities;Therapeutic exercise   PT Goals (Current goals can be found in the Care Plan section) Acute Rehab PT Goals Patient Stated Goal: "i need a new hip" PT Goal Formulation: With patient Time For Goal Achievement: 03/05/15 Potential to Achieve Goals: Good    Frequency Min 3X/week   Barriers to discharge        Co-evaluation               End of Session Equipment Utilized During Treatment: Gait belt Activity Tolerance:  (limited by inc HR to 180s with mvmt) Patient left: in chair;with call bell/phone within reach Nurse Communication: Mobility status (increased HR)         Time: 2423-5361 PT Time Calculation (min) (ACUTE ONLY): 27 min   Charges:   PT Evaluation $Initial PT Evaluation Tier I: 1 Procedure PT Treatments $Gait Training: 8-22 mins   PT G CodesKingsley [name] 02/26/2015, 10:04 AM   Thomas [name], PT, DPT Pager #: [phone] Office #: [phone]

## 2015-02-26 NOTE — Progress Notes (Signed)
[place] TEAM 1 - Stepdown/ICU TEAM Progress Note  Thomas [name] JSE:[id number] DOB: 1950-12-28 DOA: 02/25/2015 PCP: No PCP Per Patient  Admit HPI / Brief Narrative: 64 y.o. BM PMHx Chronic A. fib not on anticoagulation due to thrombocytopenia, HTN, Chronic Systolic CHF/Dilated Cardiomyopathy, EtOH abuse, ETOH Liver Dz, Noncompliance, Obesity, arthritis, Chronic Rt hip pain, anemia.   Presents to the emergency department from home diaphoretic presyncope complaining of right knee and hip pain. Initial evaluation revealed A. fib with RVR, metabolic acidosis, hypotension. Information obtained from the patient and the chart. He states that he has chronic pain in his right hip and right knee makes it difficult to bear weight and has frequent falls. He also reports he drinks 4-6 24 ounce beers daily. This morning he was having worsening right hip pain and fell. He denies hitting his head. He denies chest pain palpitation shortness of breath nausea vomiting diarrhea constipation. He denies fever chills dysuria hematuria frequency or urgency. He reports having run out of medications several months ago. He confirms not having taken digitoxin, Coreg, Lasix, Cozaar in the last several weeks. Workup in the emergency department reveals A. fib with RVR, BNP 236, initial troponin negative, Actiq acid 13.35, comprehensive metabolic panel significant for sodium of 126 chloride 88 CO2 19 creatinine 1.26, please blood count hemoglobin 8.9 MCV 108 platelets 96.  Upon presentation he was somewhat lethargic afebrile systolic blood pressure 68 her rate 150 EKG with A. fib and rapid ventricular response. X-ray of his right knee and right hip/pelvis without acute fracture. In the emergency department he received IV fluids and Cardizem drip was initiated. Second lactic acid 6.02.  HPI/Subjective: 11/8 A/O 4, NAD. States ran out of his medication therefore has not been taking meds. States has healthcare insurance but no  PCP. Having considerable Rt hip pain.  Assessment/Plan: Atrial fibrillation with RVR ([place]):  -Most likely multifactorial to include noncompliance with medication, alcohol abuse, and previous MI.  -Continue Coreg 12.5 mg BID -Continue Cardizem 30 mg QID -Continue Lasix 40 mg BID - Chronic Systolic CHF -See A. Fib -Previous EF 30% with moderate RV dysfunction. In May -Echocardiogram pending -Strict in and out -Daily weight -Transfuse hemoglobin<8 -11/8 Transfuse 2 units PRBC  ETOH abuse: - Drinks 3-4 24 ounce beers daily.  -Will implement CIWA protocol.  Alcoholic liver disease ([place]):  -Appears stable at baseline. Will monitor  Non compliance w medication regimen: - Case management consult for help in obtaining PCP  Right hip pain:/OA severe Rt Hip, Moderate Lt Hip -Orthopedic consult in the A.m. -PT/OT  Acute kidney injury [facility]): -Resolved  Anemia/thrombocytosis:  -Likely related to alcohol abuse review indicates workup with GI last year. Had EGD and colonoscopy revealing Portal hypertensive gastropathy was found in the gastric fundus and gastric body. Acute gastritis (inflammation) was found in the gastric antrum.Small hiatal hernia. Duodenal inflammation was found in the duodenal bulb.  -Will obtain FOBT.  -Platelet count close to baseline. No obvious signs bleeding.   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 -Extensive synovialchondromatosis also present in the right hip joint region.  -Moderate  OA Lt hip joint without remodeling of the femoral head.- No acute fracture or  dislocation.   Culture NA  Antibiotics: NA  DVT prophylaxis: SCD   Devices    LINES / TUBES:      Continuous Infusions:    Objective: VITAL SIGNS: Temp: 98.9 F (37.2 C) (11/08 1613) Temp Source: Oral (11/08 1613) BP: 93/64 mmHg (11/08 1613) Pulse Rate: 98 (11/08 1613) SPO2; FIO2:   Intake/Output Summary (Last 24 hours) at 02/26/15 1721 Last data filed at 02/26/15 1500  Gross per 24 hour  Intake   2390 ml  Output    550 ml  Net   1840 ml     Exam: General: A/O 4, NAD, No acute respiratory distress Eyes: Negative headache, eye pain, double vision,negative scleral hemorrhage ENT: Negative Runny nose, negative ear pain, negative gingival bleeding, Neck:  Negative scars, masses, torticollis, lymphadenopathy, JVD Lungs: Clear to auscultation bilaterally without wheezes or crackles Cardiovascular: Currently in NSR, without murmur gallop or rub normal S1 and S2 Abdomen:negative abdominal pain, nondistended, positive soft, bowel sounds, no rebound, no ascites, no appreciable mass Extremities: No significant cyanosis, clubbing, or edema bilateral lower extremities. Pain to passive and active motion lower extremities in the hips Psychiatric:  Negative depression, negative anxiety, negative fatigue, negative mania  Neurologic:  Cranial nerves II through XII intact, tongue/uvula midline, all extremities muscle strength 5/5, sensation intact throughout, negative dysarthria, negative expressive aphasia, negative receptive aphasia.   Data Reviewed: Basic Metabolic Panel:  Recent Labs Lab 02/25/15 1255 02/25/15 1844 02/26/15 0648 02/26/15 1231  NA 126* 126* 127*  --   K 4.1 3.6 3.3* 4.1  CL 88* 89* 92*  --   CO2 19* 24 26  --   GLUCOSE 137* 126* 126*  --   BUN 11 14 11   --   CREATININE 1.26* 1.12 0.79  --   CALCIUM 8.5* 8.5* 8.0*  --   MG  --   --   --  1.8   Liver Function Tests:  Recent Labs Lab 02/25/15 1255  AST 99*  ALT 25  ALKPHOS 63  BILITOT  4.3*  PROT 5.6*  ALBUMIN 3.0*   No results for input(s): LIPASE, AMYLASE in the last 168 hours. No results for input(s): AMMONIA in the last 168 hours. CBC:  Recent Labs Lab 02/25/15 1353 02/25/15 1844 02/26/15 0648  WBC 6.7  --  4.6  NEUTROABS 5.9  --   --   HGB 8.9*  --  7.5*  HCT 25.1* 24.0* 21.0*  MCV 108.7*  --  105.0*  PLT 96*  --  119*   Cardiac Enzymes: No results for input(s): CKTOTAL, CKMB, CKMBINDEX, TROPONINI in the last 168 hours. BNP (last 3 results)  Recent Labs  02/25/15 1150  BNP 236.2*    ProBNP (last 3 results) No results for input(s): PROBNP in the last 8760 hours.  CBG:  Recent Labs Lab 02/25/15 1721 02/25/15 2128 02/26/15 0820 02/26/15 1138 02/26/15 1612  GLUCAP 129* 138* 137* 127* 121*    Recent Results (from the past 240 hour(s))  Blood culture (routine x 2)     Status: None (Preliminary result)   Collection Time: 02/25/15 12:50 PM  Result Value Ref Range Status   Specimen Description BLOOD LEFT ANTECUBITAL  Final   Special Requests BOTTLES DRAWN AEROBIC AND ANAEROBIC 10MLS  Final   Culture NO GROWTH 1 DAY  Final   Report Status PENDING  Incomplete  Blood culture (routine x 2)     Status: None (Preliminary result)   Collection Time: 02/25/15  1:52 PM  Result  Value Ref Range Status   Specimen Description BLOOD LEFT HAND  Final   Special Requests   Final    BOTTLES DRAWN AEROBIC AND ANAEROBIC 3MLS RED 5MLS BLUE   Culture NO GROWTH 1 DAY  Final   Report Status PENDING  Incomplete  MRSA PCR Screening     Status: Abnormal   Collection Time: 02/25/15  4:48 PM  Result Value Ref Range Status   MRSA by PCR POSITIVE (A) NEGATIVE Final    Comment:        The GeneXpert MRSA Assay (FDA approved for NASAL specimens only), is one component of a comprehensive MRSA colonization surveillance program. It is not intended to diagnose MRSA infection nor to guide or monitor treatment for MRSA infections. RESULT CALLED TO, READ BACK BY AND  VERIFIED WITH: C.RICE,RN AT 1856 BY L.PITT 02/25/15      Studies:  Recent x-ray studies have been reviewed in detail by the Attending Physician  Scheduled Meds:  Scheduled Meds: . sodium chloride   Intravenous Once  . carvedilol  12.5 mg Oral BID WC  . Chlorhexidine Gluconate Cloth  6 each Topical Q0600  . diltiazem  30 mg Oral 4 times per day  . folic acid  1 mg Oral Daily  . furosemide  20 mg Intravenous Once  . furosemide  40 mg Oral BID  . insulin aspart  0-5 Units Subcutaneous QHS  . insulin aspart  0-9 Units Subcutaneous TID WC  . multivitamin with minerals  1 tablet Oral Daily  . mupirocin ointment  1 application Nasal BID  . thiamine  100 mg Oral Daily  . vitamin B-12  1,000 mcg Oral Daily    Time spent on care of this patient: 40 mins   Andreina Outten, Geraldo Docker , MD  [facility] Office  [phone] Pager [phone]  On-Call/Text Page:      [email or web address]      password [email or web address]  If 7PM-7AM, please contact night-coverage [email or web address] Password [email or web address] 02/26/2015, 5:21 PM   LOS: 1 day   Care during the described time interval was provided by me .  I have reviewed this patient's available data, including medical history, events of note, physical examination, and all test results as part of my evaluation. I have personally reviewed and interpreted all radiology studies.   Dia Crawford, MD [phone] Pager

## 2015-02-26 NOTE — Progress Notes (Addendum)
Subjective:  Clinically improved. HR better on IV dilt. Major complaint is hip pain  Objective:  Temp:  [98 F (36.7 C)-99.2 F (37.3 C)] 98.1 F (36.7 C) (11/08 0800) Pulse Rate:  [38-150] 111 (11/08 0800) Resp:  [13-22] 16 (11/08 0800) BP: (68-130)/(15-90) 123/74 mmHg (11/08 0800) SpO2:  [87 %-100 %] 100 % (11/08 0800) Weight:  [272 lb 4.3 oz (123.5 kg)-275 lb 9.2 oz (125 kg)] 275 lb 9.2 oz (125 kg) (11/08 0434) Weight change:   Intake/Output from previous day: 11/07 0701 - 11/08 0700 In: 2075 [P.O.:360; I.V.:965; IV Piggyback:750] Out: 100 [Urine:100]  Intake/Output from this shift:    Physical Exam: General appearance: alert and no distress Neck: no adenopathy, no carotid bruit, no JVD, supple, symmetrical, trachea midline and thyroid not enlarged, symmetric, no tenderness/mass/nodules Lungs: clear to auscultation bilaterally Heart: irregularly irregular rhythm Extremities: extremities normal, atraumatic, no cyanosis or edema  Lab Results: Results for orders placed or performed during the hospital encounter of 02/25/15 (from the past 48 hour(s))  CBG monitoring, ED     Status: Abnormal   Collection Time: 02/25/15 11:38 AM  Result Value Ref Range   Glucose-Capillary 155 (H) 65 - 99 mg/dL  Brain natriuretic peptide     Status: Abnormal   Collection Time: 02/25/15 11:50 AM  Result Value Ref Range   B Natriuretic Peptide 236.2 (H) 0.0 - 100.0 pg/mL  I-Stat Troponin, ED (not at [facility])     Status: None   Collection Time: 02/25/15 12:32 PM  Result Value Ref Range   Troponin i, poc 0.00 0.00 - 0.08 ng/mL   Comment 3            Comment: Due to the release kinetics of cTnI, a negative result within the first hours of the onset of symptoms does not rule out myocardial infarction with certainty. If myocardial infarction is still suspected, repeat the test at appropriate intervals.   I-Stat CG4 Lactic Acid, ED     Status: Abnormal   Collection Time: 02/25/15 12:34  PM  Result Value Ref Range   Lactic Acid, Venous 13.35 (HH) 0.5 - 2.0 mmol/L   Comment NOTIFIED PHYSICIAN   Comprehensive metabolic panel     Status: Abnormal   Collection Time: 02/25/15 12:55 PM  Result Value Ref Range   Sodium 126 (L) 135 - 145 mmol/L   Potassium 4.1 3.5 - 5.1 mmol/L   Chloride 88 (L) 101 - 111 mmol/L   CO2 19 (L) 22 - 32 mmol/L   Glucose, Bld 137 (H) 65 - 99 mg/dL   BUN 11 6 - 20 mg/dL   Creatinine, Ser 1.26 (H) 0.61 - 1.24 mg/dL   Calcium 8.5 (L) 8.9 - 10.3 mg/dL   Total Protein 5.6 (L) 6.5 - 8.1 g/dL   Albumin 3.0 (L) 3.5 - 5.0 g/dL   AST 99 (H) 15 - 41 U/L   ALT 25 17 - 63 U/L   Alkaline Phosphatase 63 38 - 126 U/L   Total Bilirubin 4.3 (H) 0.3 - 1.2 mg/dL   GFR calc non Af Amer 59 (L) >60 mL/min   GFR calc Af Amer >60 >60 mL/min    Comment: (NOTE) The eGFR has been calculated using the CKD EPI equation. This calculation has not been validated in all clinical situations. eGFR's persistently <60 mL/min signify possible Chronic Kidney Disease.    Anion gap 19 (H) 5 - 15  CBC with Differential     Status: Abnormal  Collection Time: 02/25/15  1:53 PM  Result Value Ref Range   WBC 6.7 4.0 - 10.5 K/uL   RBC 2.31 (L) 4.22 - 5.81 MIL/uL   Hemoglobin 8.9 (L) 13.0 - 17.0 g/dL   HCT 25.1 (L) 39.0 - 52.0 %   MCV 108.7 (H) 78.0 - 100.0 fL   MCH 38.5 (H) 26.0 - 34.0 pg   MCHC 35.5 30.0 - 36.0 g/dL   RDW 19.7 (H) 11.5 - 15.5 %   Platelets 96 (L) 150 - 400 K/uL    Comment: REPEATED TO VERIFY PLATELET COUNT CONFIRMED BY SMEAR    Neutrophils Relative % 89 %   Neutro Abs 5.9 1.7 - 7.7 K/uL   Lymphocytes Relative 5 %   Lymphs Abs 0.3 (L) 0.7 - 4.0 K/uL   Monocytes Relative 6 %   Monocytes Absolute 0.4 0.1 - 1.0 K/uL   Eosinophils Relative 0 %   Eosinophils Absolute 0.0 0.0 - 0.7 K/uL   Basophils Relative 0 %   Basophils Absolute 0.0 0.0 - 0.1 K/uL  I-Stat CG4 Lactic Acid, ED     Status: Abnormal   Collection Time: 02/25/15  3:46 PM  Result Value Ref Range     Lactic Acid, Venous 6.02 (HH) 0.5 - 2.0 mmol/L   Comment NOTIFIED PHYSICIAN   MRSA PCR Screening     Status: Abnormal   Collection Time: 02/25/15  4:48 PM  Result Value Ref Range   MRSA by PCR POSITIVE (A) NEGATIVE    Comment:        The GeneXpert MRSA Assay (FDA approved for NASAL specimens only), is one component of a comprehensive MRSA colonization surveillance program. It is not intended to diagnose MRSA infection nor to guide or monitor treatment for MRSA infections. RESULT CALLED TO, READ BACK BY AND VERIFIED WITH: C.RICE,RN AT 1856 BY L.PITT 02/25/15   Glucose, capillary     Status: Abnormal   Collection Time: 02/25/15  5:21 PM  Result Value Ref Range   Glucose-Capillary 129 (H) 65 - 99 mg/dL   Comment 1 Capillary Specimen   Ethanol     Status: None   Collection Time: 02/25/15  6:44 PM  Result Value Ref Range   Alcohol, Ethyl (B) <5 <5 mg/dL    Comment:        LOWEST DETECTABLE LIMIT FOR SERUM ALCOHOL IS 5 mg/dL FOR MEDICAL PURPOSES ONLY   Vitamin B12     Status: None   Collection Time: 02/25/15  6:44 PM  Result Value Ref Range   Vitamin B-12 430 180 - 914 pg/mL    Comment: (NOTE) This assay is not validated for testing neonatal or myeloproliferative syndrome specimens for Vitamin B12 levels.   Lactic acid, plasma     Status: Abnormal   Collection Time: 02/25/15  6:44 PM  Result Value Ref Range   Lactic Acid, Venous 4.2 (HH) 0.5 - 2.0 mmol/L    Comment: CRITICAL RESULT CALLED TO, READ BACK BY AND VERIFIED WITH: Wyn Quaker RN [phone] 2000 GREEN R   TSH     Status: None   Collection Time: 02/25/15  6:44 PM  Result Value Ref Range   TSH 2.197 0.350 - 4.500 uIU/mL  Basic metabolic panel     Status: Abnormal   Collection Time: 02/25/15  6:44 PM  Result Value Ref Range   Sodium 126 (L) 135 - 145 mmol/L   Potassium 3.6 3.5 - 5.1 mmol/L   Chloride 89 (L) 101 - 111 mmol/L  CO2 24 22 - 32 mmol/L   Glucose, Bld 126 (H) 65 - 99 mg/dL   BUN 14 6 - 20 mg/dL    Creatinine, Ser 1.12 0.61 - 1.24 mg/dL   Calcium 8.5 (L) 8.9 - 10.3 mg/dL   GFR calc non Af Amer >60 >60 mL/min   GFR calc Af Amer >60 >60 mL/min    Comment: (NOTE) The eGFR has been calculated using the CKD EPI equation. This calculation has not been validated in all clinical situations. eGFR's persistently <60 mL/min signify possible Chronic Kidney Disease.    Anion gap 13 5 - 15  Lactic acid, plasma     Status: Abnormal   Collection Time: 02/25/15  9:20 PM  Result Value Ref Range   Lactic Acid, Venous 3.5 (HH) 0.5 - 2.0 mmol/L    Comment: CRITICAL RESULT CALLED TO, READ BACK BY AND VERIFIED WITH: [id number] T TOMLINSOM,RN 2225 WILDERK   Glucose, capillary     Status: Abnormal   Collection Time: 02/25/15  9:28 PM  Result Value Ref Range   Glucose-Capillary 138 (H) 65 - 99 mg/dL   Comment 1 Capillary Specimen   Lactic acid, plasma     Status: Abnormal   Collection Time: 02/26/15 12:59 AM  Result Value Ref Range   Lactic Acid, Venous 2.4 (HH) 0.5 - 2.0 mmol/L    Comment: CRITICAL RESULT CALLED TO, READ BACK BY AND VERIFIED WITH: Barnabas Harries [id number] [facility]   Basic metabolic panel     Status: Abnormal   Collection Time: 02/26/15  6:48 AM  Result Value Ref Range   Sodium 127 (L) 135 - 145 mmol/L   Potassium 3.3 (L) 3.5 - 5.1 mmol/L   Chloride 92 (L) 101 - 111 mmol/L   CO2 26 22 - 32 mmol/L   Glucose, Bld 126 (H) 65 - 99 mg/dL   BUN 11 6 - 20 mg/dL   Creatinine, Ser 0.79 0.61 - 1.24 mg/dL   Calcium 8.0 (L) 8.9 - 10.3 mg/dL   GFR calc non Af Amer >60 >60 mL/min   GFR calc Af Amer >60 >60 mL/min    Comment: (NOTE) The eGFR has been calculated using the CKD EPI equation. This calculation has not been validated in all clinical situations. eGFR's persistently <60 mL/min signify possible Chronic Kidney Disease.    Anion gap 9 5 - 15  CBC     Status: Abnormal   Collection Time: 02/26/15  6:48 AM  Result Value Ref Range   WBC 4.6 4.0 - 10.5 K/uL   RBC 2.00 (L) 4.22 - 5.81  MIL/uL   Hemoglobin 7.5 (L) 13.0 - 17.0 g/dL   HCT 21.0 (L) 39.0 - 52.0 %   MCV 105.0 (H) 78.0 - 100.0 fL   MCH 37.5 (H) 26.0 - 34.0 pg   MCHC 35.7 30.0 - 36.0 g/dL   RDW 19.7 (H) 11.5 - 15.5 %   Platelets 119 (L) 150 - 400 K/uL    Comment: CONSISTENT WITH PREVIOUS RESULT  Lactic acid, plasma     Status: None   Collection Time: 02/26/15  6:48 AM  Result Value Ref Range   Lactic Acid, Venous 1.4 0.5 - 2.0 mmol/L  Glucose, capillary     Status: Abnormal   Collection Time: 02/26/15  8:20 AM  Result Value Ref Range   Glucose-Capillary 137 (H) 65 - 99 mg/dL    Imaging: Imaging results have been reviewed  Tele: Afib with VR 100-120  Assessment/Plan:   1. Principal  Problem: 2.   Atrial fibrillation with RVR ([place]) 3. Active Problems: 4.   Chronic atrial fibrillation ([name]) 5.   ETOH abuse 6.   Alcoholic liver disease ([place]) 7.   DCM (dilated cardiomyopathy) ([place]) 8.   Chronic systolic CHF (congestive heart failure), NYHA class 2 ([place]) 9.   Helicobacter pylori [id number] 10.   Non compliance w medication regimen 11.   Hypotensive episode 12.   Right hip pain 13.   Lactic acid increased 14.   Acute kidney injury ([place]) 15.   Metabolic acidosis 16.   Hyponatremia 17.   Anemia 18.   Thrombocytopenia ([place]) 19.   Arterial hypotension 20.   Time Spent Directly with Patient:  20 minutes  Length of Stay:  LOS: 1 day   Pt was admitted with Hip and knee pain after mechanical fall. He had mod hypotension, AF with RVR. He was not taking any of his home meds (medication non compliance and ETOH continued abuse). HR improved. Would restart home meds with the exception of dig. Change iv dilt to PO short acting then consolidate prior to DC. Nothing further to add. Will prob need an ortho consult. Doubt he will be compliant with meds after DC. HGB 8.9---> 7.5. Would Tx to HGB > 10 esp given increased VR from AF. Guaiac stools. W/U per TRH.  Quay Burow 02/26/2015, 8:30 AM

## 2015-02-27 ENCOUNTER — Inpatient Hospital Stay (HOSPITAL_COMMUNITY): Payer: Commercial Managed Care - HMO

## 2015-02-27 DIAGNOSIS — I4891 Unspecified atrial fibrillation: Secondary | ICD-10-CM

## 2015-02-27 LAB — CBC WITH DIFFERENTIAL/PLATELET
Basophils Absolute: 0 10*3/uL (ref 0.0–0.1)
Basophils Relative: 0 %
EOS PCT: 1 %
Eosinophils Absolute: 0.1 10*3/uL (ref 0.0–0.7)
HEMATOCRIT: 23.7 % — AB (ref 39.0–52.0)
Hemoglobin: 8.4 g/dL — ABNORMAL LOW (ref 13.0–17.0)
LYMPHS ABS: 1 10*3/uL (ref 0.7–4.0)
Lymphocytes Relative: 20 %
MCH: 35.3 pg — ABNORMAL HIGH (ref 26.0–34.0)
MCHC: 35.4 g/dL (ref 30.0–36.0)
MCV: 99.6 fL (ref 78.0–100.0)
MONOS PCT: 8 %
Monocytes Absolute: 0.4 10*3/uL (ref 0.1–1.0)
NEUTROS PCT: 71 %
Neutro Abs: 3.4 10*3/uL (ref 1.7–7.7)
Platelets: 113 10*3/uL — ABNORMAL LOW (ref 150–400)
RBC: 2.38 MIL/uL — AB (ref 4.22–5.81)
RDW: 22.2 % — ABNORMAL HIGH (ref 11.5–15.5)
WBC: 4.9 10*3/uL (ref 4.0–10.5)

## 2015-02-27 LAB — COMPREHENSIVE METABOLIC PANEL
ALT: 25 U/L (ref 17–63)
AST: 103 U/L — AB (ref 15–41)
Albumin: 2.5 g/dL — ABNORMAL LOW (ref 3.5–5.0)
Alkaline Phosphatase: 58 U/L (ref 38–126)
Anion gap: 8 (ref 5–15)
BUN: 12 mg/dL (ref 6–20)
CHLORIDE: 92 mmol/L — AB (ref 101–111)
CO2: 28 mmol/L (ref 22–32)
Calcium: 8.1 mg/dL — ABNORMAL LOW (ref 8.9–10.3)
Creatinine, Ser: 0.86 mg/dL (ref 0.61–1.24)
GFR calc Af Amer: >60 mL/min (ref >60)
Glucose, Bld: 119 mg/dL — ABNORMAL HIGH (ref 65–99)
POTASSIUM: 3.5 mmol/L (ref 3.5–5.1)
SODIUM: 128 mmol/L — AB (ref 135–145)
Total Bilirubin: 3.5 mg/dL — ABNORMAL HIGH (ref 0.3–1.2)
Total Protein: 5.1 g/dL — ABNORMAL LOW (ref 6.5–8.1)

## 2015-02-27 LAB — GLUCOSE, CAPILLARY
GLUCOSE-CAPILLARY: 143 mg/dL — AB (ref 65–99)
GLUCOSE-CAPILLARY: 115 mg/dL — AB (ref 65–99)
Glucose-Capillary: 119 mg/dL — ABNORMAL HIGH (ref 65–99)

## 2015-02-27 LAB — TYPE AND SCREEN
ABO/RH(D): A POS
Antibody Screen: NEGATIVE
UNIT DIVISION: 0
Unit division: 0

## 2015-02-27 LAB — MAGNESIUM: Magnesium: 1.7 mg/dL (ref 1.7–2.4)

## 2015-02-27 MED ORDER — CARVEDILOL 6.25 MG PO TABS
6.2500 mg | ORAL_TABLET | Freq: Two times a day (BID) | ORAL | Status: DC
  Administered 2015-02-27 – 2015-03-02 (×8): 6.25 mg via ORAL
  Filled 2015-02-27 (×8): qty 1

## 2015-02-27 MED ORDER — PANTOPRAZOLE SODIUM 40 MG PO TBEC
40.0000 mg | DELAYED_RELEASE_TABLET | Freq: Every day | ORAL | Status: DC
  Administered 2015-02-27 – 2015-03-02 (×4): 40 mg via ORAL
  Filled 2015-02-27 (×4): qty 1

## 2015-02-27 MED ORDER — FUROSEMIDE 40 MG PO TABS
40.0000 mg | ORAL_TABLET | Freq: Every day | ORAL | Status: DC
  Administered 2015-02-27 – 2015-03-02 (×4): 40 mg via ORAL
  Filled 2015-02-27 (×4): qty 1

## 2015-02-27 MED ORDER — SORBITOL 70 % SOLN
30.0000 mL | Freq: Once | Status: AC
  Administered 2015-02-27: 30 mL via ORAL
  Filled 2015-02-27: qty 30

## 2015-02-27 MED ORDER — LOSARTAN POTASSIUM 25 MG PO TABS
25.0000 mg | ORAL_TABLET | Freq: Every day | ORAL | Status: DC
  Administered 2015-02-27 – 2015-03-01 (×3): 25 mg via ORAL
  Filled 2015-02-27 (×3): qty 1

## 2015-02-27 NOTE — Clinical Social Work Placement (Signed)
   CLINICAL SOCIAL WORK PLACEMENT  NOTE  Date:  02/27/2015  Patient Details  Name: Deontay Ladnier MRN: [id number] Date of Birth: 10/25/1950  Clinical Social Work is seeking post-discharge placement for this patient at the [place] level of care (*CSW will initial, date and re-position this form in  chart as items are completed):  Yes   Patient/family provided with [place] Work Department's list of facilities offering this level of care within the geographic area requested by the patient (or if unable, by the patient's family).  Yes   Patient/family informed of their freedom to choose among providers that offer the needed level of care, that participate in Medicare, Medicaid or managed care program needed by the patient, have an available bed and are willing to accept the patient.  Yes   Patient/family informed of [place]'s ownership interest in [facility] and [facility], as well as of the fact that they are under no obligation to receive care at these facilities.  PASRR submitted to EDS on 02/27/15     PASRR number received on 02/27/15     Existing PASRR number confirmed on       FL2 transmitted to all facilities in geographic area requested by pt/family on 02/27/15     FL2 transmitted to all facilities within larger geographic area on       Patient informed that his/her managed care company has contracts with or will negotiate with certain facilities, including the following:            Patient/family informed of bed offers received.  Patient chooses bed at       Physician recommends and patient chooses bed at      Patient to be transferred to   on  .  Patient to be transferred to facility by       Patient family notified on   of transfer.  Name of family member notified:        PHYSICIAN Please prepare priority discharge summary, including medications, Please prepare prescriptions, Please sign FL2, Please sign DNR      Additional Comment:    _______________________________________________ Rigoberto Noel, LCSW 02/27/2015, 4:50 PM

## 2015-02-27 NOTE — NC FL2 (Signed)
[place] LEVEL OF CARE SCREENING TOOL     IDENTIFICATION  Patient Name: Thomas [name] Birthdate: [birth year] Sex: male Admission Date (Current Location): 02/25/2015  [facility] and [place] Number: Herbalist and Address:  [facility] [place], [place] [address] [place], [place] [address]      Provider Number: [id number]  Attending Physician Name and Address:  Cherene Altes, MD  Relative Name and Phone Number:       Current Level of Care: Hospital Recommended Level of Care: [place] Prior Approval Number:    Date Approved/Denied:   PASRR Number: [id number]  Discharge Plan: SNF    Current Diagnoses: Patient Active Problem List   Diagnosis Date Noted  . Non compliance w medication regimen 02/25/2015  . Hypotensive episode 02/25/2015  . Atrial fibrillation with RVR ([place]) 02/25/2015  . Right hip pain 02/25/2015  . Lactic acid increased 02/25/2015  . Acute kidney injury ([place]) 02/25/2015  . Metabolic acidosis 09/32/6712  . Hyponatremia 02/25/2015  . Anemia 02/25/2015  . Thrombocytopenia ([place]) 02/25/2015  . Arterial hypotension   . Helicobacter pylori [id number] 10/19/2013  . Lead toxicity 10/19/2013  . Hypertension   . Personal history of colonic polyps - adenomas 08/21/2013  . Portal hypertensive gastropathy 08/21/2013  . DCM (dilated cardiomyopathy) ([place]) 08/19/2013  . Chronic systolic CHF (congestive heart failure), NYHA class 2 ([place]) 08/19/2013  . Alcoholic liver disease ([place]) 08/18/2013  . Chronic atrial fibrillation ([place]) 08/17/2013  . Pancytopenia ([place]) 08/17/2013  . ETOH abuse 08/17/2013    Orientation ACTIVITIES/SOCIAL BLADDER RESPIRATION    Self, Time, Situation, Place  Active Continent Normal  BEHAVIORAL SYMPTOMS/MOOD NEUROLOGICAL BOWEL NUTRITION STATUS   (NONE)  (NONE) Continent Diet (Heart Healthy)  PHYSICIAN VISITS COMMUNICATION OF NEEDS Height & Weight Skin    Verbally 6\' 1"  (185.4  cm) 278 lbs. Normal          AMBULATORY STATUS RESPIRATION    Assist extensive Normal      Personal Care Assistance Level of Assistance  Bathing, Dressing Bathing Assistance: Limited assistance   Dressing Assistance: Limited assistance      Functional Limitations Info   (NONE)             [place]  PT (By licensed PT)     PT Frequency: 5X/week             Additional Factors Info  Isolation Precautions, Insulin Sliding Scale, Allergies, Code Status Code Status Info: Full Allergies Info: NKDA   Insulin Sliding Scale Info: average 4x/day Isolation Precautions Info: Contact for MRSA     Current Medications (02/27/2015): Current Facility-Administered Medications  Medication Dose Route Frequency Provider Last Rate Last Dose  . 0.9 %  sodium chloride infusion   Intravenous Once Allie Bossier, MD      . acetaminophen (TYLENOL) tablet 650 mg  650 mg Oral Q4H PRN Radene Gunning, NP      . carvedilol (COREG) tablet 6.25 mg  6.25 mg Oral BID WC Lorretta Harp, MD   6.25 mg at 02/27/15 1027  . Chlorhexidine Gluconate Cloth 2 % PADS 6 each  6 each Topical Q0600 Cristal Ford, DO   6 each at 02/26/15 [phone]  . diltiazem (CARDIZEM) tablet 30 mg  30 mg Oral 4 times per day Lorretta Harp, MD   30 mg at 02/27/15 1235  . folic acid (FOLVITE) tablet 1 mg  1 mg Oral Daily Allie Bossier, MD  1 mg at 02/27/15 1021  . furosemide (LASIX) tablet 40 mg  40 mg Oral Daily Lorretta Harp, MD   40 mg at 02/27/15 1020  . insulin aspart (novoLOG) injection 0-5 Units  0-5 Units Subcutaneous QHS Radene Gunning, NP   0 Units at 02/25/15 2212  . insulin aspart (novoLOG) injection 0-9 Units  0-9 Units Subcutaneous TID WC Radene Gunning, NP   1 Units at 02/27/15 1235  . LORazepam (ATIVAN) injection 2-3 mg  2-3 mg Intravenous Q1H PRN Allie Bossier, MD      . losartan (COZAAR) tablet 25 mg  25 mg Oral Daily Lorretta Harp, MD   25 mg at 02/27/15 1020  . multivitamin with  minerals tablet 1 tablet  1 tablet Oral Daily Radene Gunning, NP   1 tablet at 02/27/15 1021  . mupirocin ointment (BACTROBAN) 2 % 1 application  1 application Nasal BID Cristal Ford, DO   1 application at 58/09/98 1021  . ondansetron (ZOFRAN) injection 4 mg  4 mg Intravenous Q6H PRN Lezlie Octave Black, NP      . pantoprazole (PROTONIX) EC tablet 40 mg  40 mg Oral Daily Lorretta Harp, MD   40 mg at 02/27/15 1020  . thiamine (VITAMIN B-1) tablet 100 mg  100 mg Oral Daily Lezlie Octave Black, NP   100 mg at 02/27/15 1021  . vitamin B-12 (CYANOCOBALAMIN) tablet 1,000 mcg  1,000 mcg Oral Daily Radene Gunning, NP   1,000 mcg at 02/27/15 1020   Do not use this list as official medication orders. Please verify with discharge summary.  Discharge Medications:   Medication List    ASK your doctor about these medications        aspirin 81 MG EC tablet  Take 1 tablet (81 mg total) by mouth daily.     carvedilol 12.5 MG tablet  Commonly known as:  COREG  Take 1 tablet (12.5 mg total) by mouth 2 (two) times daily with a meal.     cyanocobalamin 1000 MCG tablet  Take 1 tablet (1,000 mcg total) by mouth daily.     digoxin 0.125 MG tablet  Commonly known as:  LANOXIN  Take 1 tablet (0.125 mg total) by mouth daily.     folic acid 1 MG tablet  Commonly known as:  FOLVITE  Take 5 tablets (5 mg total) by mouth daily.     furosemide 40 MG tablet  Commonly known as:  LASIX  Take 1 tablet (40 mg total) by mouth 2 (two) times daily.     losartan 25 MG tablet  Commonly known as:  COZAAR  Take 1 tablet (25 mg total) by mouth daily.     multivitamin with minerals Tabs tablet  Take 1 tablet by mouth daily.     naproxen sodium 220 MG tablet  Commonly known as:  ANAPROX  Take 440 mg by mouth daily.     potassium chloride SA 20 MEQ tablet  Commonly known as:  K-DUR,KLOR-CON  Take 2 tablets (40 mEq total) by mouth 2 (two) times daily.     thiamine 100 MG tablet  Take 1 tablet (100 mg total) by mouth  daily.        Relevant Imaging Results:  Relevant Lab Results:  Recent Labs    Additional Information SSN: [id number]  Rigoberto Noel, LCSW

## 2015-02-27 NOTE — Progress Notes (Signed)
Echocardiogram 2D Echocardiogram has been performed.  Thomas [name] 02/27/2015, 3:04 PM

## 2015-02-27 NOTE — Progress Notes (Signed)
[place] TEAM 1 - Stepdown/ICU TEAM PROGRESS NOTE  Thomas [name] VPX:[id number] DOB: [birth year] DOA: 02/25/2015 PCP: No PCP Per Patient  Admit HPI / Brief Narrative: 64 y.o. M w/ a Hx Chronic A. fib not on anticoagulation due to thrombocytopenia/EtOH abuse, HTN, Chronic Systolic CHF/Dilated Cardiomyopathy, EtOH abuse, ETOH Liver Dz, Noncompliance, Obesity, arthritis, Chronic Rt hip pain, and anemia who presented to the ED diaphoretic with presyncope and complaining of right knee and hip pain. Initial evaluation revealed A. fib with RVR, metabolic acidosis, hypotension.  He stated he has chronic pain in his right hip and right knee making it difficult to bear weight and leading to frequent falls. He drinks 4-6 24 ounce beers daily. He reported having run out of medications several months prior.   HPI/Subjective: The patient denies any shortness of breath or chest pain.  He complains of ongoing right hip pain.  He tells me he was told "a long time ago" that he would need surgery on his right hip but that he has not been on a follow-up with a Dr. in quite some time.    Assessment/Plan:  Chronic Atrial fibrillation with acute RVR   -acute RVR multifactorial to include noncompliance with medication, and alcohol abuse  -has now been transitioned to oral medical tx - follow in SDU one additional night, with plan to transfer to tele in AM if remains stable off gtt  Chronic Systolic CHF -Previous EF 30% with moderate RV dysfunction. In May -Echocardiogram pending -net + ~2L since admit  -Currently 126.4 kg which appears to be his approximate baseline weight  ETOH abuse -Drinks 4-6 24 ounce beers daily -Patient is being counseled on the need to abstain from alcohol abuse -No evidence of active withdrawal at present time  Alcoholic liver disease   -order labs to allow MELD to be calculated in AM   Non compliance w medication regimen -Case management consult for help in obtaining  PCP  Right hip pain / OA severe Rt Hip, Moderate Lt Hip -pt has no acute fx in hip region but instead has progressive severe degenerative changes, to include AVN of the R femoral head - he is now essentially becoming non-ambulatory -needs Orthopedic follow for ongong care of this progressive chronic problem, and potential scheduling for surgery - will ask for consult in AM   Acute kidney injury  -Resolved  Anemia/thrombocytosis -EGD and colonoscopy in 2015 revealed portal hypertensive gastropathy in the gastric fundus and gastric body, acute gastritis in the gastric antrum, small hiatal hernia, and inflammation in the duodenal bulb -transfused 2U PRBC this admit w/ only modest change in Hgb - cont to follow in serial fashion to assure acute gross bleeding not occurring  -repeat GI w/u likely would simply confirm ongoing presence of these issues as pt continues to abuse EtOH -Platelet count close to baseline  Hyponatremia -beer drinkers potomania +/- cirrhosis   Hypokalemia -Supplement as indicated and follow  MRSA screen +  Code Status: FULL Family Communication: no family present at time of exam Disposition Plan: probable transfer to tele bed in AM  Consultants: [facility]   Procedures: TTE - pending   Antibiotics: none  DVT prophylaxis: SCDs  Objective: Blood pressure 109/70, pulse 80, temperature 98.4 F (36.9 C), temperature source Oral, resp. rate 19, height 6\' 1"  (1.854 m), weight 126.4 kg (278 lb 10.6 oz), SpO2 96 %.  Intake/Output Summary (Last 24 hours) at 02/27/15 1652 Last data filed at 02/27/15 1451  Gross per 24 hour  Intake   1750 ml  Output   1850 ml  Net   -100 ml   Exam: General: No acute respiratory distress Lungs: Clear to auscultation bilaterally without wheezes or crackles Cardiovascular: Regular rate and rhythm without murmur gallop or rub  Abdomen: Nontender, nondistended, soft, bowel sounds positive, no rebound, no ascites, no  appreciable mass Extremities: No significant cyanosis, or clubbing;  1+ edema bilateral lower extremities  Data Reviewed: Basic Metabolic Panel:  Recent Labs Lab 02/25/15 1255 02/25/15 1844 02/26/15 0648 02/26/15 1231 02/27/15 0210  NA 126* 126* 127*  --  128*  K 4.1 3.6 3.3* 4.1 3.5  CL 88* 89* 92*  --  92*  CO2 19* 24 26  --  28  GLUCOSE 137* 126* 126*  --  119*  BUN 11 14 11   --  12  CREATININE 1.26* 1.12 0.79  --  0.86  CALCIUM 8.5* 8.5* 8.0*  --  8.1*  MG  --   --   --  1.8 1.7    CBC:  Recent Labs Lab 02/25/15 1353 02/25/15 1844 02/26/15 0648 02/27/15 0210  WBC 6.7  --  4.6 4.9  NEUTROABS 5.9  --   --  3.4  HGB 8.9*  --  7.5* 8.4*  HCT 25.1* 24.0* 21.0* 23.7*  MCV 108.7*  --  105.0* 99.6  PLT 96*  --  119* 113*    Liver Function Tests:  Recent Labs Lab 02/25/15 1255 02/27/15 0210  AST 99* 103*  ALT 25 25  ALKPHOS 63 58  BILITOT 4.3* 3.5*  PROT 5.6* 5.1*  ALBUMIN 3.0* 2.5*   CBG:  Recent Labs Lab 02/26/15 1138 02/26/15 1612 02/26/15 2131 02/27/15 0735 02/27/15 1106  GLUCAP 127* 121* 126* 119* 143*    Recent Results (from the past 240 hour(s))  Blood culture (routine x 2)     Status: None (Preliminary result)   Collection Time: 02/25/15 12:50 PM  Result Value Ref Range Status   Specimen Description BLOOD LEFT ANTECUBITAL  Final   Special Requests BOTTLES DRAWN AEROBIC AND ANAEROBIC 10MLS  Final   Culture NO GROWTH 2 DAYS  Final   Report Status PENDING  Incomplete  Blood culture (routine x 2)     Status: None (Preliminary result)   Collection Time: 02/25/15  1:52 PM  Result Value Ref Range Status   Specimen Description BLOOD LEFT HAND  Final   Special Requests   Final    BOTTLES DRAWN AEROBIC AND ANAEROBIC 3MLS RED 5MLS BLUE   Culture NO GROWTH 2 DAYS  Final   Report Status PENDING  Incomplete  MRSA PCR Screening     Status: Abnormal   Collection Time: 02/25/15  4:48 PM  Result Value Ref Range Status   MRSA by PCR POSITIVE (A)  NEGATIVE Final    Comment:        The GeneXpert MRSA Assay (FDA approved for NASAL specimens only), is one component of a comprehensive MRSA colonization surveillance program. It is not intended to diagnose MRSA infection nor to guide or monitor treatment for MRSA infections. RESULT CALLED TO, READ BACK BY AND VERIFIED WITH: C.RICE,RN AT 1856 BY L.PITT 02/25/15      Studies:   Recent x-ray studies have been reviewed in detail by the Attending Physician  Scheduled Meds:  Scheduled Meds: . sodium chloride   Intravenous Once  . carvedilol  6.25 mg Oral BID WC  . Chlorhexidine Gluconate Cloth  6 each Topical Q0600  . diltiazem  30 mg Oral 4 times per day  . folic acid  1 mg Oral Daily  . furosemide  40 mg Oral Daily  . insulin aspart  0-5 Units Subcutaneous QHS  . insulin aspart  0-9 Units Subcutaneous TID WC  . losartan  25 mg Oral Daily  . multivitamin with minerals  1 tablet Oral Daily  . mupirocin ointment  1 application Nasal BID  . pantoprazole  40 mg Oral Daily  . thiamine  100 mg Oral Daily  . vitamin B-12  1,000 mcg Oral Daily    Time spent on care of this patient: 35 mins   Livia Tarr T , MD   [facility] Office  [phone] Pager - Text Page per Shea Evans as per below:  On-Call/Text Page:      [email or web address]      password [email or web address]  If 7PM-7AM, please contact night-coverage [email or web address] Password [email or web address] 02/27/2015, 4:52 PM   LOS: 2 days

## 2015-02-27 NOTE — Clinical Social Work Note (Signed)
Clinical Social Work Assessment  Patient Details  Name: Thomas [name] MRN: [id number] Date of Birth: Feb 08, 1951  Date of referral:  02/27/15               Reason for consult:  Discharge Planning, Facility Placement                Permission sought to share information with:  Facility Art therapist granted to share information::  Yes, Verbal Permission Granted  Name::        Agency::  SNFs  Relationship::     Contact Information:     Housing/Transportation Living arrangements for the past 2 months:  Single Family Home Source of Information:  Patient Patient Interpreter Needed:  None Criminal Activity/Legal Involvement Pertinent to Current Situation/Hospitalization:  No - Comment as needed Significant Relationships:  Parents Lives with:  Parents Do you feel safe going back to the place where you live?  Yes Need for family participation in patient care:  Yes (Comment)  Care giving concerns:  Patient reports no concerns.   Social Worker assessment / plan:  CSW met with patient at bedside to complete assessment and discuss PT recommendation for SNF at discharge. Patient states that he was admitted from home where he lives with his mother. The patient states that he is open to SNF placement for short term rehab at discharge. He is not familiar with the facilities in [facility] but is willing to look at the options. CSW explained SNF search/placement process and answered the patient's questions. CSW will follow up with bed offers once available.  Employment status:    Insurance information:  Managed Care PT Recommendations:  [place] / Referral to community resources:  [place]  Patient/Family's Response to care:  Patient appears happy with the care he has received here at [facility].  Patient/Family's Understanding of and Emotional Response to Diagnosis, Current Treatment, and Prognosis:  Patient appears to have a good  understanding of reason for admission and seems to realize that he will likely need continued rehab at discharge.    Emotional Assessment Appearance:  Appears stated age Attitude/Demeanor/Rapport:  Other (Appropriate and welcoming of CSW) Affect (typically observed):  Accepting, Appropriate, Calm, Pleasant Orientation:  Oriented to Self, Oriented to Place, Oriented to Situation, Oriented to  Time Alcohol / Substance use:  Alcohol Use Psych involvement (Current and /or in the community):  No (Comment)  Discharge Needs  Concerns to be addressed:  Discharge Planning Concerns Readmission within the last 30 days:  No Current discharge risk:  Physical Impairment Barriers to Discharge:  Continued Medical Work up   [employer] MSW, [place], [place], [phone]

## 2015-02-27 NOTE — Progress Notes (Signed)
Subjective:  No CP/SOB  Objective:  Temp:  [98 F (36.7 C)-99 F (37.2 C)] 98.8 F (37.1 C) (11/09 0736) Pulse Rate:  [80-98] 80 (11/09 0500) Resp:  [12-23] 17 (11/09 0736) BP: (81-121)/(51-79) 121/75 mmHg (11/09 0736) SpO2:  [94 %-98 %] 95 % (11/09 0736) Weight:  [278 lb 10.6 oz (126.4 kg)] 278 lb 10.6 oz (126.4 kg) (11/09 0500) Weight change: 6 lb 6.3 oz (2.9 kg)  Intake/Output from previous day: 11/08 0701 - 11/09 0700 In: 1900 [P.O.:950; I.V.:280; Blood:670] Out: 1450 [Urine:1450]  Intake/Output from this shift: Total I/O In: -  Out: 425 [Urine:425]  Physical Exam: General appearance: alert and no distress Neck: no adenopathy, no carotid bruit, no JVD, supple, symmetrical, trachea midline and thyroid not enlarged, symmetric, no tenderness/mass/nodules Lungs: clear to auscultation bilaterally Heart: irregularly irregular rhythm Extremities: extremities normal, atraumatic, no cyanosis or edema  Lab Results: Results for orders placed or performed during the hospital encounter of 02/25/15 (from the past 48 hour(s))  CBG monitoring, ED     Status: Abnormal   Collection Time: 02/25/15 11:38 AM  Result Value Ref Range   Glucose-Capillary 155 (H) 65 - 99 mg/dL  Brain natriuretic peptide     Status: Abnormal   Collection Time: 02/25/15 11:50 AM  Result Value Ref Range   B Natriuretic Peptide 236.2 (H) 0.0 - 100.0 pg/mL  I-Stat Troponin, ED (not at [facility])     Status: None   Collection Time: 02/25/15 12:32 PM  Result Value Ref Range   Troponin i, poc 0.00 0.00 - 0.08 ng/mL   Comment 3            Comment: Due to the release kinetics of cTnI, a negative result within the first hours of the onset of symptoms does not rule out myocardial infarction with certainty. If myocardial infarction is still suspected, repeat the test at appropriate intervals.   I-Stat CG4 Lactic Acid, ED     Status: Abnormal   Collection Time: 02/25/15 12:34 PM  Result Value Ref Range   Lactic Acid, Venous 13.35 (HH) 0.5 - 2.0 mmol/L   Comment NOTIFIED PHYSICIAN   Blood culture (routine x 2)     Status: None (Preliminary result)   Collection Time: 02/25/15 12:50 PM  Result Value Ref Range   Specimen Description BLOOD LEFT ANTECUBITAL    Special Requests BOTTLES DRAWN AEROBIC AND ANAEROBIC 10MLS    Culture NO GROWTH 1 DAY    Report Status PENDING   Comprehensive metabolic panel     Status: Abnormal   Collection Time: 02/25/15 12:55 PM  Result Value Ref Range   Sodium 126 (L) 135 - 145 mmol/L   Potassium 4.1 3.5 - 5.1 mmol/L   Chloride 88 (L) 101 - 111 mmol/L   CO2 19 (L) 22 - 32 mmol/L   Glucose, Bld 137 (H) 65 - 99 mg/dL   BUN 11 6 - 20 mg/dL   Creatinine, Ser 1.26 (H) 0.61 - 1.24 mg/dL   Calcium 8.5 (L) 8.9 - 10.3 mg/dL   Total Protein 5.6 (L) 6.5 - 8.1 g/dL   Albumin 3.0 (L) 3.5 - 5.0 g/dL   AST 99 (H) 15 - 41 U/L   ALT 25 17 - 63 U/L   Alkaline Phosphatase 63 38 - 126 U/L   Total Bilirubin 4.3 (H) 0.3 - 1.2 mg/dL   GFR calc non Af Amer 59 (L) >60 mL/min   GFR calc Af Amer >60 >60 mL/min  Comment: (NOTE) The eGFR has been calculated using the CKD EPI equation. This calculation has not been validated in all clinical situations. eGFR's persistently <60 mL/min signify possible Chronic Kidney Disease.    Anion gap 19 (H) 5 - 15  Blood culture (routine x 2)     Status: None (Preliminary result)   Collection Time: 02/25/15  1:52 PM  Result Value Ref Range   Specimen Description BLOOD LEFT HAND    Special Requests      BOTTLES DRAWN AEROBIC AND ANAEROBIC 3MLS RED 5MLS BLUE   Culture NO GROWTH 1 DAY    Report Status PENDING   CBC with Differential     Status: Abnormal   Collection Time: 02/25/15  1:53 PM  Result Value Ref Range   WBC 6.7 4.0 - 10.5 K/uL   RBC 2.31 (L) 4.22 - 5.81 MIL/uL   Hemoglobin 8.9 (L) 13.0 - 17.0 g/dL   HCT 25.1 (L) 39.0 - 52.0 %   MCV 108.7 (H) 78.0 - 100.0 fL   MCH 38.5 (H) 26.0 - 34.0 pg   MCHC 35.5 30.0 - 36.0 g/dL   RDW  19.7 (H) 11.5 - 15.5 %   Platelets 96 (L) 150 - 400 K/uL    Comment: REPEATED TO VERIFY PLATELET COUNT CONFIRMED BY SMEAR    Neutrophils Relative % 89 %   Neutro Abs 5.9 1.7 - 7.7 K/uL   Lymphocytes Relative 5 %   Lymphs Abs 0.3 (L) 0.7 - 4.0 K/uL   Monocytes Relative 6 %   Monocytes Absolute 0.4 0.1 - 1.0 K/uL   Eosinophils Relative 0 %   Eosinophils Absolute 0.0 0.0 - 0.7 K/uL   Basophils Relative 0 %   Basophils Absolute 0.0 0.0 - 0.1 K/uL  I-Stat CG4 Lactic Acid, ED     Status: Abnormal   Collection Time: 02/25/15  3:46 PM  Result Value Ref Range   Lactic Acid, Venous 6.02 (HH) 0.5 - 2.0 mmol/L   Comment NOTIFIED PHYSICIAN   MRSA PCR Screening     Status: Abnormal   Collection Time: 02/25/15  4:48 PM  Result Value Ref Range   MRSA by PCR POSITIVE (A) NEGATIVE    Comment:        The GeneXpert MRSA Assay (FDA approved for NASAL specimens only), is one component of a comprehensive MRSA colonization surveillance program. It is not intended to diagnose MRSA infection nor to guide or monitor treatment for MRSA infections. RESULT CALLED TO, READ BACK BY AND VERIFIED WITH: C.RICE,RN AT 1856 BY L.PITT 02/25/15   Glucose, capillary     Status: Abnormal   Collection Time: 02/25/15  5:21 PM  Result Value Ref Range   Glucose-Capillary 129 (H) 65 - 99 mg/dL   Comment 1 Capillary Specimen   Ethanol     Status: None   Collection Time: 02/25/15  6:44 PM  Result Value Ref Range   Alcohol, Ethyl (B) <5 <5 mg/dL    Comment:        LOWEST DETECTABLE LIMIT FOR SERUM ALCOHOL IS 5 mg/dL FOR MEDICAL PURPOSES ONLY   Vitamin B12     Status: None   Collection Time: 02/25/15  6:44 PM  Result Value Ref Range   Vitamin B-12 430 180 - 914 pg/mL    Comment: (NOTE) This assay is not validated for testing neonatal or myeloproliferative syndrome specimens for Vitamin B12 levels.   Folate RBC     Status: Abnormal   Collection Time: 02/25/15  6:44 PM  Result Value Ref Range   Folate,  Hemolysate 170.1 Not Estab. ng/mL   Hematocrit 24.0 (L) 37.5 - 51.0 %   Folate, RBC 709 >498 ng/mL    Comment: (NOTE) Performed At: [facility] [place], [place] [id number] Lindon Romp MD ME:[id number]   Lactic acid, plasma     Status: Abnormal   Collection Time: 02/25/15  6:44 PM  Result Value Ref Range   Lactic Acid, Venous 4.2 (HH) 0.5 - 2.0 mmol/L    Comment: CRITICAL RESULT CALLED TO, READ BACK BY AND VERIFIED WITH: Wyn Quaker RN [phone] 2000 GREEN R   TSH     Status: None   Collection Time: 02/25/15  6:44 PM  Result Value Ref Range   TSH 2.197 0.350 - 4.500 uIU/mL  Basic metabolic panel     Status: Abnormal   Collection Time: 02/25/15  6:44 PM  Result Value Ref Range   Sodium 126 (L) 135 - 145 mmol/L   Potassium 3.6 3.5 - 5.1 mmol/L   Chloride 89 (L) 101 - 111 mmol/L   CO2 24 22 - 32 mmol/L   Glucose, Bld 126 (H) 65 - 99 mg/dL   BUN 14 6 - 20 mg/dL   Creatinine, Ser 1.12 0.61 - 1.24 mg/dL   Calcium 8.5 (L) 8.9 - 10.3 mg/dL   GFR calc non Af Amer >60 >60 mL/min   GFR calc Af Amer >60 >60 mL/min    Comment: (NOTE) The eGFR has been calculated using the CKD EPI equation. This calculation has not been validated in all clinical situations. eGFR's persistently <60 mL/min signify possible Chronic Kidney Disease.    Anion gap 13 5 - 15  Lactic acid, plasma     Status: Abnormal   Collection Time: 02/25/15  9:20 PM  Result Value Ref Range   Lactic Acid, Venous 3.5 (HH) 0.5 - 2.0 mmol/L    Comment: CRITICAL RESULT CALLED TO, READ BACK BY AND VERIFIED WITH: [id number] T TOMLINSOM,RN 2225 WILDERK   Glucose, capillary     Status: Abnormal   Collection Time: 02/25/15  9:28 PM  Result Value Ref Range   Glucose-Capillary 138 (H) 65 - 99 mg/dL   Comment 1 Capillary Specimen   Lactic acid, plasma     Status: Abnormal   Collection Time: 02/26/15 12:59 AM  Result Value Ref Range   Lactic Acid, Venous 2.4 (HH) 0.5 - 2.0 mmol/L    Comment: CRITICAL RESULT  CALLED TO, READ BACK BY AND VERIFIED WITH: Barnabas Harries [id number] [facility]   Basic metabolic panel     Status: Abnormal   Collection Time: 02/26/15  6:48 AM  Result Value Ref Range   Sodium 127 (L) 135 - 145 mmol/L   Potassium 3.3 (L) 3.5 - 5.1 mmol/L   Chloride 92 (L) 101 - 111 mmol/L   CO2 26 22 - 32 mmol/L   Glucose, Bld 126 (H) 65 - 99 mg/dL   BUN 11 6 - 20 mg/dL   Creatinine, Ser 0.79 0.61 - 1.24 mg/dL   Calcium 8.0 (L) 8.9 - 10.3 mg/dL   GFR calc non Af Amer >60 >60 mL/min   GFR calc Af Amer >60 >60 mL/min    Comment: (NOTE) The eGFR has been calculated using the CKD EPI equation. This calculation has not been validated in all clinical situations. eGFR's persistently <60 mL/min signify possible Chronic Kidney Disease.    Anion gap 9 5 - 15  CBC     Status:  Abnormal   Collection Time: 02/26/15  6:48 AM  Result Value Ref Range   WBC 4.6 4.0 - 10.5 K/uL   RBC 2.00 (L) 4.22 - 5.81 MIL/uL   Hemoglobin 7.5 (L) 13.0 - 17.0 g/dL   HCT 21.0 (L) 39.0 - 52.0 %   MCV 105.0 (H) 78.0 - 100.0 fL   MCH 37.5 (H) 26.0 - 34.0 pg   MCHC 35.7 30.0 - 36.0 g/dL   RDW 19.7 (H) 11.5 - 15.5 %   Platelets 119 (L) 150 - 400 K/uL    Comment: CONSISTENT WITH PREVIOUS RESULT  Lactic acid, plasma     Status: None   Collection Time: 02/26/15  6:48 AM  Result Value Ref Range   Lactic Acid, Venous 1.4 0.5 - 2.0 mmol/L  Glucose, capillary     Status: Abnormal   Collection Time: 02/26/15  8:20 AM  Result Value Ref Range   Glucose-Capillary 137 (H) 65 - 99 mg/dL  Glucose, capillary     Status: Abnormal   Collection Time: 02/26/15 11:38 AM  Result Value Ref Range   Glucose-Capillary 127 (H) 65 - 99 mg/dL  Potassium     Status: None   Collection Time: 02/26/15 12:31 PM  Result Value Ref Range   Potassium 4.1 3.5 - 5.1 mmol/L  Magnesium     Status: None   Collection Time: 02/26/15 12:31 PM  Result Value Ref Range   Magnesium 1.8 1.7 - 2.4 mg/dL  Glucose, capillary     Status: Abnormal    Collection Time: 02/26/15  4:12 PM  Result Value Ref Range   Glucose-Capillary 121 (H) 65 - 99 mg/dL  Prepare RBC     Status: None   Collection Time: 02/26/15  4:35 PM  Result Value Ref Range   Order Confirmation ORDER PROCESSED BY BLOOD BANK   Type and screen [place]     Status: None (Preliminary result)   Collection Time: 02/26/15  5:00 PM  Result Value Ref Range   ABO/RH(D) A POS    Antibody Screen NEG    Sample Expiration 03/01/2015    Unit Number [id number]    Blood Component Type RED CELLS,LR    Unit division 00    Status of Unit ISSUED    Transfusion Status OK TO TRANSFUSE    Crossmatch Result Compatible    Unit Number [id number]    Blood Component Type RED CELLS,LR    Unit division 00    Status of Unit ISSUED    Transfusion Status OK TO TRANSFUSE    Crossmatch Result Compatible   Urine rapid drug screen (hosp performed)     Status: None   Collection Time: 02/26/15  8:02 PM  Result Value Ref Range   Opiates NONE DETECTED NONE DETECTED   Cocaine NONE DETECTED NONE DETECTED   Benzodiazepines NONE DETECTED NONE DETECTED   Amphetamines NONE DETECTED NONE DETECTED   Tetrahydrocannabinol NONE DETECTED NONE DETECTED   Barbiturates NONE DETECTED NONE DETECTED    Comment:        DRUG SCREEN FOR MEDICAL PURPOSES ONLY.  IF CONFIRMATION IS NEEDED FOR ANY PURPOSE, NOTIFY LAB WITHIN 5 DAYS.        LOWEST DETECTABLE LIMITS FOR URINE DRUG SCREEN Drug Class       Cutoff (ng/mL) Amphetamine      1000 Barbiturate      200 Benzodiazepine   924 Tricyclics       462 Opiates  300 Cocaine          300 THC              50   Glucose, capillary     Status: Abnormal   Collection Time: 02/26/15  9:31 PM  Result Value Ref Range   Glucose-Capillary 126 (H) 65 - 99 mg/dL   Comment 1 Capillary Specimen   Comprehensive metabolic panel     Status: Abnormal   Collection Time: 02/27/15  2:10 AM  Result Value Ref Range   Sodium 128 (L) 135 - 145 mmol/L     Potassium 3.5 3.5 - 5.1 mmol/L   Chloride 92 (L) 101 - 111 mmol/L   CO2 28 22 - 32 mmol/L   Glucose, Bld 119 (H) 65 - 99 mg/dL   BUN 12 6 - 20 mg/dL   Creatinine, Ser 0.86 0.61 - 1.24 mg/dL   Calcium 8.1 (L) 8.9 - 10.3 mg/dL   Total Protein 5.1 (L) 6.5 - 8.1 g/dL   Albumin 2.5 (L) 3.5 - 5.0 g/dL   AST 103 (H) 15 - 41 U/L   ALT 25 17 - 63 U/L   Alkaline Phosphatase 58 38 - 126 U/L   Total Bilirubin 3.5 (H) 0.3 - 1.2 mg/dL   GFR calc non Af Amer >60 >60 mL/min   GFR calc Af Amer >60 >60 mL/min    Comment: (NOTE) The eGFR has been calculated using the CKD EPI equation. This calculation has not been validated in all clinical situations. eGFR's persistently <60 mL/min signify possible Chronic Kidney Disease.    Anion gap 8 5 - 15  CBC with Differential/Platelet     Status: Abnormal   Collection Time: 02/27/15  2:10 AM  Result Value Ref Range   WBC 4.9 4.0 - 10.5 K/uL   RBC 2.38 (L) 4.22 - 5.81 MIL/uL   Hemoglobin 8.4 (L) 13.0 - 17.0 g/dL   HCT 23.7 (L) 39.0 - 52.0 %   MCV 99.6 78.0 - 100.0 fL   MCH 35.3 (H) 26.0 - 34.0 pg   MCHC 35.4 30.0 - 36.0 g/dL   RDW 22.2 (H) 11.5 - 15.5 %   Platelets 113 (L) 150 - 400 K/uL    Comment: CONSISTENT WITH PREVIOUS RESULT   Neutrophils Relative % 71 %   Lymphocytes Relative 20 %   Monocytes Relative 8 %   Eosinophils Relative 1 %   Basophils Relative 0 %   Neutro Abs 3.4 1.7 - 7.7 K/uL   Lymphs Abs 1.0 0.7 - 4.0 K/uL   Monocytes Absolute 0.4 0.1 - 1.0 K/uL   Eosinophils Absolute 0.1 0.0 - 0.7 K/uL   Basophils Absolute 0.0 0.0 - 0.1 K/uL   RBC Morphology TARGET CELLS   Magnesium     Status: None   Collection Time: 02/27/15  2:10 AM  Result Value Ref Range   Magnesium 1.7 1.7 - 2.4 mg/dL  Glucose, capillary     Status: Abnormal   Collection Time: 02/27/15  7:35 AM  Result Value Ref Range   Glucose-Capillary 119 (H) 65 - 99 mg/dL   Comment 1 Capillary Specimen     Imaging: Imaging results have been reviewed  Tele: Afib with VR  90s ( I personally reviewed). Assessment/Plan:   1. Principal Problem: 2.   Atrial fibrillation with RVR ([place]) 3. Active Problems: 4.   Chronic atrial fibrillation ([name]) 5.   ETOH abuse 6.   Alcoholic liver disease ([place]) 7.   DCM (dilated cardiomyopathy) ([place])  8.   Chronic systolic CHF (congestive heart failure), NYHA class 2 ([place]) 9.   Helicobacter pylori [id number] 10.   Non compliance w medication regimen 11.   Hypotensive episode 12.   Right hip pain 13.   Lactic acid increased 14.   Acute kidney injury ([place]) 15.   Metabolic acidosis 16.   Hyponatremia 17.   Anemia 18.   Thrombocytopenia ([place]) 19.   Arterial hypotension 20.   Time Spent Directly with Patient:  20 minutes  Length of Stay:  LOS: 2 days    Transitioned from IV---> PO short acting dilt. Will restart home meds. VR better controlled in 90s (should improve with adding back coreg as well). S/P Tx 2 U PRBCs with increase in Hgb 7.5---> 8.4. I would have suspected higher. Will guaiac stools and start PPI empirically. OOB to chair and PT. Needs ortho eval for Hip pain. Prob OK to Tx to tele.   Quay Burow 02/27/2015, 9:30 AM

## 2015-02-28 DIAGNOSIS — R001 Bradycardia, unspecified: Secondary | ICD-10-CM | POA: Diagnosis present

## 2015-02-28 LAB — COMPREHENSIVE METABOLIC PANEL
ALK PHOS: 60 U/L (ref 38–126)
ALT: 29 U/L (ref 17–63)
AST: 121 U/L — AB (ref 15–41)
Albumin: 2.5 g/dL — ABNORMAL LOW (ref 3.5–5.0)
Anion gap: 9 (ref 5–15)
BUN: 10 mg/dL (ref 6–20)
CHLORIDE: 90 mmol/L — AB (ref 101–111)
CO2: 26 mmol/L (ref 22–32)
CREATININE: 0.86 mg/dL (ref 0.61–1.24)
Calcium: 8.2 mg/dL — ABNORMAL LOW (ref 8.9–10.3)
GFR calc non Af Amer: >60 mL/min (ref >60)
GLUCOSE: 110 mg/dL — AB (ref 65–99)
Potassium: 3.6 mmol/L (ref 3.5–5.1)
SODIUM: 125 mmol/L — AB (ref 135–145)
TOTAL PROTEIN: 5.1 g/dL — AB (ref 6.5–8.1)
Total Bilirubin: 1.9 mg/dL — ABNORMAL HIGH (ref 0.3–1.2)

## 2015-02-28 LAB — GLUCOSE, CAPILLARY
GLUCOSE-CAPILLARY: 114 mg/dL — AB (ref 65–99)
Glucose-Capillary: 117 mg/dL — ABNORMAL HIGH (ref 65–99)
Glucose-Capillary: 137 mg/dL — ABNORMAL HIGH (ref 65–99)
Glucose-Capillary: 114 mg/dL — ABNORMAL HIGH (ref 65–99)

## 2015-02-28 LAB — CBC
HCT: 24 % — ABNORMAL LOW (ref 39.0–52.0)
HEMOGLOBIN: 8.7 g/dL — AB (ref 13.0–17.0)
MCH: 36.1 pg — AB (ref 26.0–34.0)
MCHC: 36.3 g/dL — ABNORMAL HIGH (ref 30.0–36.0)
MCV: 99.6 fL (ref 78.0–100.0)
Platelets: 141 10*3/uL — ABNORMAL LOW (ref 150–400)
RBC: 2.41 MIL/uL — AB (ref 4.22–5.81)
RDW: 23.1 % — ABNORMAL HIGH (ref 11.5–15.5)
WBC: 5.8 10*3/uL (ref 4.0–10.5)

## 2015-02-28 LAB — PROTIME-INR
INR: 1.13 (ref 0.00–1.49)
PROTHROMBIN TIME: 14.7 s (ref 11.6–15.2)

## 2015-02-28 MED ORDER — DILTIAZEM HCL ER COATED BEADS 120 MG PO CP24
120.0000 mg | ORAL_CAPSULE | ORAL | Status: DC
  Administered 2015-03-01: 120 mg via ORAL
  Filled 2015-02-28 (×2): qty 1

## 2015-02-28 NOTE — Progress Notes (Addendum)
SBP running in the 80s-upper 70's earlier, pt was up to chair with therapy then, denied dizziness, SOB, mentating well. Had am lasix, coreg and cozaar earlier this am.   Continued to monitor, pt BP currently 75/53 and 72/50.  No c/o from pt, alert oriented. Ate lunch well.    Pt due for po cardizem 120mg  CD .    Text message sent to Dr Sherral Hammers.

## 2015-02-28 NOTE — Progress Notes (Signed)
Physical Therapy Treatment Patient Details Name: Thomas [name] MRN: [id number] DOB: May 15, 1950 Today's Date: 02/28/2015    History of Present Illness Thomas [name] is a 64 y.o. male past medical history that includes A. fib not on anticoagulation due to thrombocytopenia, EtOH abuse and noncompliance obesity, arthritis, hypertension chronic right hip pain anemia presents to the emergency department from home diaphoretic presyncope complaining of right knee and hip pain. Initial evaluation revealed A. fib with RVR, metabolic acidosis, hypotension.    PT Comments    Pt admitted with above diagnosis. Pt currently with functional limitations due to balance and endurance deficits. Pt doing better with ambulation.  Do not feel that pt will need SNF and changed d/c plan to home with HHPT [name]/u.  Pt and mom live at home and pt has RW.  Will continue acute PT.   Pt will benefit from skilled PT to increase their independence and safety with mobility to allow discharge to the venue listed below.    Follow Up Recommendations  Home health PT;Supervision - Intermittent     Equipment Recommendations  None recommended by PT    Recommendations for Other Services       Precautions / Restrictions Precautions Precautions: Fall Restrictions Weight Bearing Restrictions: No    Mobility  Bed Mobility Overal bed mobility: Needs Assistance Bed Mobility: Supine to Sit     Supine to sit: Supervision     General bed mobility comments: supervision of safety as pt moves rather quickly.   Transfers Overall transfer level: Needs assistance Equipment used: Rolling walker (2 wheeled) Transfers: Sit to/from Stand Sit to Stand: Min guard         General transfer comment: v/c's for safety.  Pt needed cues for hand placement for powering up.  Also cues for walker placement for transfers as pt not keeping RW close to him at times.    Ambulation/Gait Ambulation/Gait assistance: Min guard;+2  safety/equipment Ambulation Distance (Feet): 125 Feet Assistive device: Rolling walker (2 wheeled) Gait Pattern/deviations: Step-through pattern;Decreased stride length;Wide base of support;Shuffle Gait velocity: impulsive at times Gait velocity interpretation: <1.8 ft/sec, indicative of risk for recurrent falls General Gait Details: mild SOB, not unsteady but still requiring max v/c's for walker safety to not push too far out in front. pt with extremely wide base of support and minimal foot clearance. Needed incr cues to stay close to RW with turns.    Stairs            Wheelchair Mobility    Modified Rankin (Stroke Patients Only)       Balance Overall balance assessment: Needs assistance;History of Falls Sitting-balance support: No upper extremity supported;Feet supported Sitting balance-Leahy Scale: Good Sitting balance - Comments: can don/doff socks   Standing balance support: Bilateral upper extremity supported;During functional activity Standing balance-Leahy Scale: Poor Standing balance comment: requires UE support for standing due to right hip pain             High level balance activites: Direction changes;Turns High Level Balance Comments: Pt needed min guard assist and cues for safety with RW with turns.    Cognition Arousal/Alertness: Awake/alert Behavior During Therapy: WFL for tasks assessed/performed Overall Cognitive Status: Within Functional Limits for tasks assessed                      Exercises      General Comments        Pertinent Vitals/Pain Pain Assessment: Faces Faces Pain Scale: Hurts a little bit  Pain Location: right hip Pain Descriptors / Indicators: Grimacing Pain Intervention(s): Limited activity within patient's tolerance;Monitored during session;Repositioned  VSS with HR 110-140 bpm with activity.    Home Living                      Prior Function            PT Goals (current goals can now be found  in the care plan section) Progress towards PT goals: Progressing toward goals    Frequency  Min 3X/week    PT Plan Discharge plan needs to be updated    Co-evaluation PT/OT/SLP Co-Evaluation/Treatment: Yes Reason for Co-Treatment: For patient/therapist safety PT goals addressed during session: Mobility/safety with mobility       End of Session Equipment Utilized During Treatment: Gait belt Activity Tolerance: Patient tolerated treatment well Patient left: in chair;with call bell/phone within reach     Time: 1045-1104 PT Time Calculation (min) (ACUTE ONLY): 19 min  Charges:  $Gait Training: 8-22 mins                    G CodesIrwin Brakeman [name] 2015-03-11, 11:26 AM Thomas [name] Acute Rehabilitation [phone] (pager)

## 2015-02-28 NOTE — Progress Notes (Signed)
[facility] TEAM 1 - Stepdown/ICU TEAM Progress Note  Thomas [name] [id number] DOB: [birth year] DOA: 02/25/2015 PCP: No PCP Per Patient  Admit HPI / Brief Narrative: 64 y.o. BM PMHx Chronic A. fib not on anticoagulation due to thrombocytopenia, HTN, Chronic Systolic CHF/Dilated Cardiomyopathy, EtOH abuse, ETOH Liver Dz, Noncompliance, Obesity, arthritis, Chronic Rt hip pain, anemia.   Presents to the emergency department from home diaphoretic presyncope complaining of right knee and hip pain. Initial evaluation revealed A. fib with RVR, metabolic acidosis, hypotension. Information obtained from the patient and the chart. He states that he has chronic pain in his right hip and right knee makes it difficult to bear weight and has frequent falls. He also reports he drinks 4-6 24 ounce beers daily. This morning he was having worsening right hip pain and fell. He denies hitting his head. He denies chest pain palpitation shortness of breath nausea vomiting diarrhea constipation. He denies fever chills dysuria hematuria frequency or urgency. He reports having run out of medications several months ago. He confirms not having taken digitoxin, Coreg, Lasix, Cozaar in the last several weeks. Workup in the emergency department reveals A. fib with RVR, BNP 236, initial troponin negative, Actiq acid 13.35, comprehensive metabolic panel significant for sodium of 126 chloride 88 CO2 19 creatinine 1.26, please blood count hemoglobin 8.9 MCV 108 platelets 96.  Upon presentation he was somewhat lethargic afebrile systolic blood pressure 68 her rate 150 EKG with A. fib and rapid ventricular response. X-ray of his right knee and right hip/pelvis without acute fracture. In the emergency department he received IV fluids and Cardizem drip was initiated. Second lactic acid 6.02.  HPI/Subjective: 11/10 A/O 4, NAD. Still Having considerable Rt hip pain but states ambulated with walker today fairly  well.  Assessment/Plan: Atrial fibrillation with RVR ([place]):  -Most likely multifactorial to include noncompliance with medication, alcohol abuse, and previous MI.  -Continue Coreg 6.25 mg BID -Continue Cardizem CD 120 daily held secondary to patient being hypotensive and bradycardic. Patient did receive one dose of short-acting Cardizem earlier today which may be the culprit. Reevaluate if patient requires Cardizem at all in the a.m. -Continue Lasix 40 mg daily  Chronic Systolic CHF -See A. Fib -Previous EF 30% with moderate RV dysfunction. In May -Echocardiogram; EF slightly improved from May  -Strict in and out since admission + 283ml -Daily weight; 11/10= 125.1 kg -Transfuse hemoglobin<8  ETOH abuse: - Drinks 3-4 24 ounce beers daily.  -Counseled extensively on need to abstain.  Alcoholic liver disease ([place]):  -Appears stable at baseline. Will monitor  Non compliance w medication regimen: - Case management consult for help in obtaining PCP  Right hip pain:/OA severe Rt Hip, Moderate Lt Hip -Spoke with Dr. Christia Reading Draper's office: Sports medicine [place]. Dr. Micheline Chapman will not be available to see patient in the hospital. Patient to make outpatient appointment. -PT/OT  Acute kidney injury [facility]): -Resolved  Anemia/thrombocytosis:  -Likely related to alcohol abuse review indicates workup with GI last year. Had EGD and colonoscopy revealing Portal hypertensive gastropathy was found in the gastric fundus and gastric body. Acute gastritis (inflammation) was found in the gastric antrum.Small hiatal hernia. Duodenal inflammation was found in the duodenal bulb.  -Anemia stable no sign of overt bleeding -Platelet count slowly trending up   Hyponatremia -Secondary Potomania  Hypokalemia -Potassium goal> 4   Code Status: FULL Family Communication: no family present at time of exam Disposition Plan: Home vs SNF    Consultants: Spoke with Dr.  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 -Extensive synovialchondromatosis also present in the right hip joint region.  -Moderate  OA Lt hip joint without remodeling of the femoral head.- No acute fracture or dislocation. -11/8 Transfuse 2 units PRBC 11/9 echocardiogram;- LVEF= 40% to 45%.Diffuse hypokinesis. - Mitral valve: moderate regurgitation.- Pulmonary arteries: PA peak pressure: 35 mm Hg (S).   Culture NA  Antibiotics: NA  DVT prophylaxis: SCD   Devices    LINES / TUBES:      Continuous Infusions:    Objective: VITAL SIGNS: Temp: 98.2 F (36.8 C) (11/10 1634) Temp Source: Oral (11/10 1634) BP: 109/85 mmHg (11/10 1816) Pulse Rate: 79 (11/10 0947) SPO2; FIO2:   Intake/Output Summary (Last 24 hours) at 02/28/15 1908 Last data filed at 02/28/15 1600  Gross per 24 hour  Intake    960 ml  Output   2750 ml  Net  -1790 ml     Exam: General: A/O 4, NAD, No acute respiratory distress Eyes: Negative headache, eye pain, double vision,negative scleral hemorrhage ENT: Negative Runny nose, negative ear pain, negative gingival bleeding, Neck:  Negative scars, masses, torticollis, lymphadenopathy, JVD Lungs: Clear to auscultation bilaterally without wheezes or crackles Cardiovascular: Irregular irregular rhythm and rate, without murmur gallop or rub normal S1 and S2 Abdomen:negative abdominal pain, nondistended, positive soft, bowel sounds, no rebound, no ascites, no appreciable mass Extremities: No significant cyanosis, clubbing, or edema bilateral lower extremities. Pain to passive and active motion lower extremities in the hips Psychiatric:  Negative depression, negative anxiety, negative fatigue, negative mania  Neurologic:  Cranial nerves II through XII intact, tongue/uvula midline, all extremities muscle  strength 5/5, sensation intact throughout, negative dysarthria, negative expressive aphasia, negative receptive aphasia.   Data Reviewed: Basic Metabolic Panel:  Recent Labs Lab 02/25/15 1255 02/25/15 1844 02/26/15 0648 02/26/15 1231 02/27/15 0210 02/28/15 0236  NA 126* 126* 127*  --  128* 125*  K 4.1 3.6 3.3* 4.1 3.5 3.6  CL 88* 89* 92*  --  92* 90*  CO2 19* 24 26  --  28 26  GLUCOSE 137* 126* 126*  --  119* 110*  BUN 11 14 11   --  12 10  CREATININE 1.26* 1.12 0.79  --  0.86 0.86  CALCIUM 8.5* 8.5* 8.0*  --  8.1* 8.2*  MG  --   --   --  1.8 1.7  --    Liver Function Tests:  Recent Labs Lab 02/25/15 1255 02/27/15 0210 02/28/15 0236  AST 99* 103* 121*  ALT 25 25 29   ALKPHOS 63 58 60  BILITOT 4.3* 3.5* 1.9*  PROT 5.6* 5.1* 5.1*  ALBUMIN 3.0* 2.5* 2.5*   No results for input(s): LIPASE, AMYLASE in the last 168 hours. No results for input(s): AMMONIA in the last 168 hours. CBC:  Recent Labs Lab 02/25/15 1353 02/25/15 1844 02/26/15 0648 02/27/15 0210 02/28/15 0236  WBC 6.7  --  4.6 4.9 5.8  NEUTROABS 5.9  --   --  3.4  --   HGB 8.9*  --  7.5* 8.4* 8.7*  HCT 25.1* 24.0* 21.0* 23.7* 24.0*  MCV 108.7*  --  105.0* 99.6 99.6  PLT 96*  --  119* 113* 141*   Cardiac Enzymes: No results for input(s): CKTOTAL, CKMB, CKMBINDEX, TROPONINI in the last 168 hours. BNP (last 3 results)  Recent Labs  02/25/15 1150  BNP 236.2*    ProBNP (last 3 results) No results for input(s): PROBNP in the last 8760 hours.  CBG:  Recent Labs Lab 02/27/15 1630 02/27/15 2237 02/28/15 0745 02/28/15 1123 02/28/15 1631  GLUCAP 115* 114* 117* 137* 114*    Recent Results (from the past 240 hour(s))  Blood culture (routine x 2)     Status: None (Preliminary result)   Collection Time: 02/25/15 12:50 PM  Result Value Ref Range Status   Specimen Description BLOOD LEFT ANTECUBITAL  Final   Special Requests BOTTLES DRAWN AEROBIC AND ANAEROBIC 10MLS  Final   Culture NO GROWTH 3  DAYS  Final   Report Status PENDING  Incomplete  Blood culture (routine x 2)     Status: None (Preliminary result)   Collection Time: 02/25/15  1:52 PM  Result Value Ref Range Status   Specimen Description BLOOD LEFT HAND  Final   Special Requests   Final    BOTTLES DRAWN AEROBIC AND ANAEROBIC 3MLS RED 5MLS BLUE   Culture NO GROWTH 3 DAYS  Final   Report Status PENDING  Incomplete  MRSA PCR Screening     Status: Abnormal   Collection Time: 02/25/15  4:48 PM  Result Value Ref Range Status   MRSA by PCR POSITIVE (A) NEGATIVE Final    Comment:        The GeneXpert MRSA Assay (FDA approved for NASAL specimens only), is one component of a comprehensive MRSA colonization surveillance program. It is not intended to diagnose MRSA infection nor to guide or monitor treatment for MRSA infections. RESULT CALLED TO, READ BACK BY AND VERIFIED WITH: C.RICE,RN AT 1856 BY L.PITT 02/25/15      Studies:  Recent x-ray studies have been reviewed in detail by the Attending Physician  Scheduled Meds:  Scheduled Meds: . carvedilol  6.25 mg Oral BID WC  . Chlorhexidine Gluconate Cloth  6 each Topical Q0600  . diltiazem  120 mg Oral Q24H  . folic acid  1 mg Oral Daily  . furosemide  40 mg Oral Daily  . insulin aspart  0-5 Units Subcutaneous QHS  . insulin aspart  0-9 Units Subcutaneous TID WC  . losartan  25 mg Oral Daily  . multivitamin with minerals  1 tablet Oral Daily  . mupirocin ointment  1 application Nasal BID  . pantoprazole  40 mg Oral Daily  . thiamine  100 mg Oral Daily  . vitamin B-12  1,000 mcg Oral Daily    Time spent on care of this patient: 40 mins   WOODS, Geraldo Docker , MD  [facility] Office  [phone] Pager [phone]  On-Call/Text Page:      [email or web address]      password [email or web address]  If 7PM-7AM, please contact night-coverage [email or web address] Password [email or web address] 02/28/2015, 7:08 PM   LOS: 3 days   Care during the described time interval was provided by me .  I  have reviewed this patient's available data, including medical history, events of note, physical examination, and all test results as part of my evaluation. I have personally reviewed and interpreted all radiology studies.   Dia Crawford, MD [phone] Pager

## 2015-02-28 NOTE — Evaluation (Signed)
Occupational Therapy Evaluation Patient Details Name: Thomas [name] MRN: [id number] DOB: Oct 04, 1950 Today's Date: 02/28/2015    History of Present Illness Thomas [name] is a 64 y.o. male past medical history that includes A. fib not on anticoagulation due to thrombocytopenia, EtOH abuse and noncompliance obesity, arthritis, hypertension chronic right hip pain anemia presents to the emergency department from home diaphoretic presyncope complaining of right knee and hip pain. Initial evaluation revealed A. fib with RVR, metabolic acidosis, hypotension.   Clinical Impression   This 64 yo male admitted with above presents to acute OT with decreased safety awareness with use of RW and decreased balance. He will benefit from acute OT with follow up [place] to get to a Mod I level here and at home.    Follow Up Recommendations  Home health OT    Equipment Recommendations  None recommended by OT       Precautions / Restrictions Precautions Precautions: Fall Restrictions Weight Bearing Restrictions: No      Mobility Bed Mobility Overal bed mobility: Needs Assistance Bed Mobility: Supine to Sit     Supine to sit: Supervision     General bed mobility comments: supervision of safety as pt moves rather quickly.   Transfers Overall transfer level: Needs assistance Equipment used: Rolling walker (2 wheeled) Transfers: Sit to/from Stand Sit to Stand: Min guard         General transfer comment: v/c's for safety.  Pt needed cues for hand placement for powering up.  Also cues for walker placement for transfers as pt not keeping RW close to him at times.      Balance Overall balance assessment: Needs assistance;History of Falls Sitting-balance support: No upper extremity supported;Feet supported Sitting balance-Leahy Scale: Good Sitting balance - Comments: can don/doff socks without LOB   Standing balance support: Bilateral upper extremity supported;During functional  activity Standing balance-Leahy Scale: Poor Standing balance comment: reuqires UE support for standing due to right hip pain             High level balance activites: Direction changes;Turns High Level Balance Comments: Pt needed min guard assist and cues for safety with RW with turns.            ADL Overall ADL's : Needs assistance/impaired Eating/Feeding: Independent;Sitting   Grooming: Set up;Oral care;Standing   Upper Body Bathing: Set up;Sitting   Lower Body Bathing: Min guard;Sit to/from stand   Upper Body Dressing : Set up;Sitting   Lower Body Dressing: Min guard;Sit to/from stand Lower Body Dressing Details (indicate cue type and reason): turns sideways on bed to get to his feet to don socks Toilet Transfer: Min guard;Ambulation;RW;Comfort height toilet;Grab bars Toilet Transfer Details (indicate cue type and reason): reports he has a sink next to his at home [place] and Hygiene: Min guard;Sit to/from Nurse, children's Details (indicate cue type and reason): Spoke to pt about possible need for a tub seat/bench making him aware of insurance not paying for them, but that I could give him a handout and he could be on the look out for one on his own for cheaper than he can buy new.-he reports "I just need to get this hip done and I'll be fine.         Vision Additional Comments: No change from baseline          Pertinent Vitals/Pain Pain Assessment: Faces Faces Pain Scale: Hurts a little bit Pain Location: right hip Pain Descriptors / Indicators: Grimacing  Pain Intervention(s): Limited activity within patient's tolerance;Monitored during session;Repositioned     Hand Dominance Right   Extremity/Trunk Assessment Upper Extremity Assessment Upper Extremity Assessment: RUE deficits/detail;LUE deficits/detail RUE Deficits / Details: RA. when he attempts to do pure shoulder flexion he has to go into abduction but then can  only get to about 80 degrees flexion; hands are functional but quite arthritic for his age 30 Coordination: decreased gross motor LUE Deficits / Details: RA. when he attempts to do pure shoulder flexion he has to go into abduction but then can only get to about 80 degrees flexion; hands are functional but quite arthritic for his age LUE Coordination: decreased gross motor           Communication Communication Communication: No difficulties   Cognition Arousal/Alertness: Awake/alert Behavior During Therapy: WFL for tasks assessed/performed Overall Cognitive Status: Within Functional Limits for tasks assessed                                Home Living Family/patient expects to be discharged to:: Private residence Living Arrangements: Parent Available Help at Discharge: Family;Available 24 hours/day (28 year old mother that "is better off than I am" per pt) Type of Home: House Home Access: Stairs to enter   Entrance Stairs-Rails: Right Home Layout: One level     Bathroom Shower/Tub: Tub/shower unit;Curtain Shower/tub characteristics: Architectural technologist: Handicapped height     Home Equipment: Environmental consultant - 2 wheels;Cane - single point          Prior Functioning/Environment Level of Independence: Needs assistance  Gait / Transfers Assistance Needed: uses cane ADL's / Homemaking Assistance Needed: pt baths and dresses self, mother does all IADLs   Comments: pt's mother does the grocery shopping, cooking and cleaning. pt uses a cane sometimes    OT Diagnosis: Generalized weakness;Acute pain   OT Problem List: Decreased strength;Decreased safety awareness;Impaired balance (sitting and/or standing);Pain   OT Treatment/Interventions: Self-care/ADL training;Patient/family education;Balance training;DME and/or AE instruction    OT Goals(Current goals can be found in the care plan section) Acute Rehab OT Goals Patient Stated Goal: "I just need a new hip" OT Goal  Formulation: With patient Time For Goal Achievement: 03/07/15 Potential to Achieve Goals: Good  OT Frequency: Min 2X/week           Co-evaluation PT/OT/SLP Co-Evaluation/Treatment: Yes Reason for Co-Treatment: For patient/therapist safety PT goals addressed during session: Mobility/safety with mobility OT goals addressed during session: ADL's and self-care;Strengthening/ROM      End of Session Equipment Utilized During Treatment: Gait belt;Rolling walker  Activity Tolerance: Patient tolerated treatment well Patient left: in chair;with call bell/phone within reach   Time: 1045-1104 OT Time Calculation (min): 19 min Charges:  OT General Charges $OT Visit: 1 Procedure OT Evaluation $Initial OT Evaluation Tier I: 1 Procedure  Almon Register [id number] 02/28/2015, 2:27 PM

## 2015-02-28 NOTE — Progress Notes (Signed)
Dr Sherral Hammers returned call, instructed to hold off on giving cardizem at this time, plan to monitor BP, no further instructions at this time, pt mentating well. Will see pt when rounding on unit.

## 2015-02-28 NOTE — Clinical Social Work Note (Signed)
Therapy recommendations have changed to HHPT/OT. SNF bed offers given to patient. Patient states that if he needs SNF at discharge he would choose to go to [facility]. Facility has been notified.    Liz Beach MSW, [place], [place], [id number]

## 2015-02-28 NOTE — Progress Notes (Signed)
Subjective:  He denies any chest pain, dyspnea. Telemetry overnight with HR 70s-80s.   Objective: Temp:  [98.4 F (36.9 C)-98.7 F (37.1 C)] 98.7 F (37.1 C) (11/10 0748) Resp:  [14-28] 24 (11/10 0748) BP: (82-115)/(50-78) 96/70 mmHg (11/10 0748) SpO2:  [96 %-98 %] 96 % (11/10 0748) Weight:  [276 lb (125.193 kg)] 276 lb (125.193 kg) (11/10 0359) Weight change: -2 lb 10.6 oz (-1.207 kg)  Intake/Output from previous day: 11/09 0701 - 11/10 0700 In: 720 [P.O.:720] Out: 2975 [Urine:2975]  Intake/Output from this shift:    Physical Exam: General: obese African American male resting in bed, NAD HEENT: PERRL, EOMI, no scleral icterus, mild JVD Cardiac: irregular rate and rhythm, no rubs, murmurs or gallops Pulm: clear to auscultation bilaterally, no wheezes, rales, or rhonchi Abd: soft, nontender, nondistended, BS present Ext: warm and well perfused, 2+ pitting edema of bilateral Neuro: responds to questions appropriately; moving all extremities freely   Lab Results: Results for orders placed or performed during the hospital encounter of 02/25/15 (from the past 48 hour(s))  Glucose, capillary     Status: Abnormal   Collection Time: 02/26/15 11:38 AM  Result Value Ref Range   Glucose-Capillary 127 (H) 65 - 99 mg/dL  Potassium     Status: None   Collection Time: 02/26/15 12:31 PM  Result Value Ref Range   Potassium 4.1 3.5 - 5.1 mmol/L  Magnesium     Status: None   Collection Time: 02/26/15 12:31 PM  Result Value Ref Range   Magnesium 1.8 1.7 - 2.4 mg/dL  Glucose, capillary     Status: Abnormal   Collection Time: 02/26/15  4:12 PM  Result Value Ref Range   Glucose-Capillary 121 (H) 65 - 99 mg/dL  Prepare RBC     Status: None   Collection Time: 02/26/15  4:35 PM  Result Value Ref Range   Order Confirmation ORDER PROCESSED BY BLOOD BANK   Type and screen [place]     Status: None   Collection Time: 02/26/15  5:00 PM  Result Value Ref Range   ABO/RH(D) A POS    Antibody Screen NEG    Sample Expiration 03/01/2015    Unit Number [id number]    Blood Component Type RED CELLS,LR    Unit division 00    Status of Unit ISSUED,FINAL    Transfusion Status OK TO TRANSFUSE    Crossmatch Result Compatible    Unit Number [id number]    Blood Component Type RED CELLS,LR    Unit division 00    Status of Unit ISSUED,FINAL    Transfusion Status OK TO TRANSFUSE    Crossmatch Result Compatible   Urine rapid drug screen (hosp performed)     Status: None   Collection Time: 02/26/15  8:02 PM  Result Value Ref Range   Opiates NONE DETECTED NONE DETECTED   Cocaine NONE DETECTED NONE DETECTED   Benzodiazepines NONE DETECTED NONE DETECTED   Amphetamines NONE DETECTED NONE DETECTED   Tetrahydrocannabinol NONE DETECTED NONE DETECTED   Barbiturates NONE DETECTED NONE DETECTED    Comment:        DRUG SCREEN FOR MEDICAL PURPOSES ONLY.  IF CONFIRMATION IS NEEDED FOR ANY PURPOSE, NOTIFY LAB WITHIN 5 DAYS.        LOWEST DETECTABLE LIMITS FOR URINE DRUG SCREEN Drug Class       Cutoff (ng/mL) Amphetamine      1000 Barbiturate      200 Benzodiazepine   200  Tricyclics       326 Opiates          300 Cocaine          300 THC              50   Glucose, capillary     Status: Abnormal   Collection Time: 02/26/15  9:31 PM  Result Value Ref Range   Glucose-Capillary 126 (H) 65 - 99 mg/dL   Comment 1 Capillary Specimen   Comprehensive metabolic panel     Status: Abnormal   Collection Time: 02/27/15  2:10 AM  Result Value Ref Range   Sodium 128 (L) 135 - 145 mmol/L   Potassium 3.5 3.5 - 5.1 mmol/L   Chloride 92 (L) 101 - 111 mmol/L   CO2 28 22 - 32 mmol/L   Glucose, Bld 119 (H) 65 - 99 mg/dL   BUN 12 6 - 20 mg/dL   Creatinine, Ser 0.86 0.61 - 1.24 mg/dL   Calcium 8.1 (L) 8.9 - 10.3 mg/dL   Total Protein 5.1 (L) 6.5 - 8.1 g/dL   Albumin 2.5 (L) 3.5 - 5.0 g/dL   AST 103 (H) 15 - 41 U/L   ALT 25 17 - 63 U/L   Alkaline Phosphatase 58 38 -  126 U/L   Total Bilirubin 3.5 (H) 0.3 - 1.2 mg/dL   GFR calc non Af Amer >60 >60 mL/min   GFR calc Af Amer >60 >60 mL/min    Comment: (NOTE) The eGFR has been calculated using the CKD EPI equation. This calculation has not been validated in all clinical situations. eGFR's persistently <60 mL/min signify possible Chronic Kidney Disease.    Anion gap 8 5 - 15  CBC with Differential/Platelet     Status: Abnormal   Collection Time: 02/27/15  2:10 AM  Result Value Ref Range   WBC 4.9 4.0 - 10.5 K/uL   RBC 2.38 (L) 4.22 - 5.81 MIL/uL   Hemoglobin 8.4 (L) 13.0 - 17.0 g/dL   HCT 23.7 (L) 39.0 - 52.0 %   MCV 99.6 78.0 - 100.0 fL   MCH 35.3 (H) 26.0 - 34.0 pg   MCHC 35.4 30.0 - 36.0 g/dL   RDW 22.2 (H) 11.5 - 15.5 %   Platelets 113 (L) 150 - 400 K/uL    Comment: CONSISTENT WITH PREVIOUS RESULT   Neutrophils Relative % 71 %   Lymphocytes Relative 20 %   Monocytes Relative 8 %   Eosinophils Relative 1 %   Basophils Relative 0 %   Neutro Abs 3.4 1.7 - 7.7 K/uL   Lymphs Abs 1.0 0.7 - 4.0 K/uL   Monocytes Absolute 0.4 0.1 - 1.0 K/uL   Eosinophils Absolute 0.1 0.0 - 0.7 K/uL   Basophils Absolute 0.0 0.0 - 0.1 K/uL   RBC Morphology TARGET CELLS   Magnesium     Status: None   Collection Time: 02/27/15  2:10 AM  Result Value Ref Range   Magnesium 1.7 1.7 - 2.4 mg/dL  Glucose, capillary     Status: Abnormal   Collection Time: 02/27/15  7:35 AM  Result Value Ref Range   Glucose-Capillary 119 (H) 65 - 99 mg/dL   Comment 1 Capillary Specimen   Glucose, capillary     Status: Abnormal   Collection Time: 02/27/15 11:06 AM  Result Value Ref Range   Glucose-Capillary 143 (H) 65 - 99 mg/dL   Comment 1 Capillary Specimen   Glucose, capillary     Status: Abnormal  Collection Time: 02/27/15  4:30 PM  Result Value Ref Range   Glucose-Capillary 115 (H) 65 - 99 mg/dL   Comment 1 Capillary Specimen   Glucose, capillary     Status: Abnormal   Collection Time: 02/27/15 10:37 PM  Result Value Ref  Range   Glucose-Capillary 114 (H) 65 - 99 mg/dL   Comment 1 Capillary Specimen   CBC     Status: Abnormal   Collection Time: 02/28/15  2:36 AM  Result Value Ref Range   WBC 5.8 4.0 - 10.5 K/uL   RBC 2.41 (L) 4.22 - 5.81 MIL/uL   Hemoglobin 8.7 (L) 13.0 - 17.0 g/dL   HCT 24.0 (L) 39.0 - 52.0 %   MCV 99.6 78.0 - 100.0 fL   MCH 36.1 (H) 26.0 - 34.0 pg   MCHC 36.3 (H) 30.0 - 36.0 g/dL   RDW 23.1 (H) 11.5 - 15.5 %   Platelets 141 (L) 150 - 400 K/uL  Comprehensive metabolic panel     Status: Abnormal   Collection Time: 02/28/15  2:36 AM  Result Value Ref Range   Sodium 125 (L) 135 - 145 mmol/L   Potassium 3.6 3.5 - 5.1 mmol/L   Chloride 90 (L) 101 - 111 mmol/L   CO2 26 22 - 32 mmol/L   Glucose, Bld 110 (H) 65 - 99 mg/dL   BUN 10 6 - 20 mg/dL   Creatinine, Ser 0.86 0.61 - 1.24 mg/dL   Calcium 8.2 (L) 8.9 - 10.3 mg/dL   Total Protein 5.1 (L) 6.5 - 8.1 g/dL   Albumin 2.5 (L) 3.5 - 5.0 g/dL   AST 121 (H) 15 - 41 U/L   ALT 29 17 - 63 U/L   Alkaline Phosphatase 60 38 - 126 U/L   Total Bilirubin 1.9 (H) 0.3 - 1.2 mg/dL   GFR calc non Af Amer >60 >60 mL/min   GFR calc Af Amer >60 >60 mL/min    Comment: (NOTE) The eGFR has been calculated using the CKD EPI equation. This calculation has not been validated in all clinical situations. eGFR's persistently <60 mL/min signify possible Chronic Kidney Disease.    Anion gap 9 5 - 15  Protime-INR     Status: None   Collection Time: 02/28/15  2:36 AM  Result Value Ref Range   Prothrombin Time 14.7 11.6 - 15.2 seconds   INR 1.13 0.00 - 1.49  Glucose, capillary     Status: Abnormal   Collection Time: 02/28/15  7:45 AM  Result Value Ref Range   Glucose-Capillary 117 (H) 65 - 99 mg/dL   Comment 1 Capillary Specimen     Imaging: Imaging results have been reviewed  Tele: Paroxysmal a-fib with HR 90s  Assessment/Plan:   Principal Problem:   Atrial fibrillation with RVR ([name]) Active Problems:   Chronic atrial fibrillation ([name])   ETOH  abuse   Alcoholic liver disease ([name])   DCM (dilated cardiomyopathy) ([name])   Chronic systolic CHF (congestive heart failure), NYHA class 2 ([name])   Helicobacter pylori [id number]   Non compliance w medication regimen   Hypotensive episode   Right hip pain   Lactic acid increased   Acute kidney injury ([name])   Metabolic acidosis   Hyponatremia   Anemia   Thrombocytopenia ([name])   Arterial hypotension   Chronic paroxysmal a-fib with RVR: Likely 2/2 non-adherence to medications in the setting of fall. Cardiomyopathy also contributory. RVR resolved. Now on oral diltiazem and back on home Coreg and Cozaar. Amiodarone  deferred given risk of hepatotoxicity with underlying hepatic disease as noted on prior imaging.  -Counseled patient on the importance of adherence and follow-up -Counseled patient against EtOH abuse -Recommend transfer to telemetry -Will sign off but follow again if re-consulted  Chronic non-ischemic dilated cardiomyopathy: EF 40-45% with moderate MR, mild RA/LA on this admission. Presumed to be in the setting of ongoing alcohol use though there was a mention of lead toxicity in prior notes.   Pancytopenia: Likely in the setting of underlying liver disease.   Lactic acidosis: Resolved on 11/8.   Chronic alcohol use: As noted above.  Right hip pain: Likely 2/2 avascular necrosis as noted on prior imaging.   Hyponatremia: Likely 2/2 CHF +/- cirrhosis given findings of hypervolemia as noted above.  Length of Stay:  LOS: 3 days   Charlott Rakes, PGY2 Internal Medicine Pager: [phone] 02/28/2015, 8:37 AM   Agree with note by Dr. Posey Pronto. AFIB rate better controlled on PO Dilt and BB. WIll consolidate to Cardizem CD 120. EF 40% by 2D. On approp meds . No evidence of CHF. Hgb stable. Not an oral AC candidate. Hip films so AVN/OA or right hiip. Ortho to see. Will S/O and arrange OP F/U with Dr. Radford Pax.  Lorretta Harp, M.D., [place], [facility], [place] [address]. [place], [place]  [address]  [phone] 02/28/2015 9:17 AM

## 2015-02-28 NOTE — Progress Notes (Signed)
Pt BP at 1816 was 109/85 with HR 90, due to get coreg and still has cardizem CD 120mg  to be given. Dr Sherral Hammers informed and received order to give coreg at this time and give cardizem later this evening/night if HR is consistently >90.  Order written under nursing misc and will discuss with oncoming RN.

## 2015-03-01 LAB — BASIC METABOLIC PANEL
ANION GAP: 10 (ref 5–15)
BUN: 9 mg/dL (ref 6–20)
CO2: 27 mmol/L (ref 22–32)
Calcium: 8.5 mg/dL — ABNORMAL LOW (ref 8.9–10.3)
Chloride: 95 mmol/L — ABNORMAL LOW (ref 101–111)
Creatinine, Ser: 0.99 mg/dL (ref 0.61–1.24)
GFR calc Af Amer: >60 mL/min (ref >60)
GLUCOSE: 101 mg/dL — AB (ref 65–99)
POTASSIUM: 3.5 mmol/L (ref 3.5–5.1)
Sodium: 132 mmol/L — ABNORMAL LOW (ref 135–145)

## 2015-03-01 LAB — GLUCOSE, CAPILLARY
GLUCOSE-CAPILLARY: 109 mg/dL — AB (ref 65–99)
GLUCOSE-CAPILLARY: 133 mg/dL — AB (ref 65–99)
GLUCOSE-CAPILLARY: 108 mg/dL — AB (ref 65–99)
Glucose-Capillary: 123 mg/dL — ABNORMAL HIGH (ref 65–99)
Glucose-Capillary: 124 mg/dL — ABNORMAL HIGH (ref 65–99)

## 2015-03-01 LAB — CBC
HCT: 24 % — ABNORMAL LOW (ref 39.0–52.0)
Hemoglobin: 8.4 g/dL — ABNORMAL LOW (ref 13.0–17.0)
MCH: 35.1 pg — ABNORMAL HIGH (ref 26.0–34.0)
MCHC: 35 g/dL (ref 30.0–36.0)
MCV: 100.4 fL — AB (ref 78.0–100.0)
PLATELETS: 151 10*3/uL (ref 150–400)
RBC: 2.39 MIL/uL — AB (ref 4.22–5.81)
RDW: 22.6 % — ABNORMAL HIGH (ref 11.5–15.5)
WBC: 5 10*3/uL (ref 4.0–10.5)

## 2015-03-01 LAB — MAGNESIUM: MAGNESIUM: 1.8 mg/dL (ref 1.7–2.4)

## 2015-03-01 MED ORDER — LOSARTAN POTASSIUM 25 MG PO TABS
12.5000 mg | ORAL_TABLET | Freq: Every day | ORAL | Status: DC
  Administered 2015-03-02: 12.5 mg via ORAL
  Filled 2015-03-01: qty 1

## 2015-03-01 NOTE — Progress Notes (Signed)
Occupational Therapy Treatment Patient Details Name: Thomas [name] MRN: [id number] DOB: 06-16-1950 Today's Date: 03/01/2015    History of present illness Thomas [name] is a 64 y.o. male past medical history that includes A. fib not on anticoagulation due to thrombocytopenia, EtOH abuse and noncompliance obesity, arthritis, hypertension chronic right hip pain anemia presents to the emergency department from home diaphoretic presyncope complaining of right knee and hip pain. Initial evaluation revealed A. fib with RVR, metabolic acidosis, hypotension.   OT comments  This 64 yo male admitted with above presents to acute OT with continued decreased balance and needs RW to help with this. He did show increased safety with RW use today over yesterday, but still needs cues to slow down.  Follow Up Recommendations  Home health OT    Equipment Recommendations  None recommended by OT       Precautions / Restrictions Precautions Precautions: Fall Restrictions Weight Bearing Restrictions: No       Mobility Bed Mobility Overal bed mobility: Modified Independent Bed Mobility: Supine to Sit              Transfers Overall transfer level: Needs assistance Equipment used: Rolling walker (2 wheeled) Transfers: Sit to/from Stand Sit to Stand: Min guard         General transfer comment: v/c's for safety.  Also cues for walker placement for transfers as pt not keeping RW close to him at times--but better than yesterday  He ambulated 150 feet with RW at min guard A level.    Balance Overall balance assessment: History of Falls;Needs assistance Sitting-balance support: No upper extremity supported;Feet supported Sitting balance-Leahy Scale: Good     Standing balance support: Bilateral upper extremity supported;During functional activity Standing balance-Leahy Scale: Poor Standing balance comment: one mild LOB while turning                    ADL Overall ADL's : Needs  assistance/impaired     Grooming: Wash/dry face;Oral care;Standing;Set up;Supervision/safety   Upper Body Bathing: Set up;Supervision/ safety;Sitting   Lower Body Bathing: Min guard;Sit to/from stand           Toilet Transfer: Min guard;Ambulation;RW;Comfort height toilet;Grab bars   Toileting- Clothing Manipulation and Hygiene: Min guard;Sit to/from stand         General ADL Comments: Issued pt a bag for his RW so she does not try to ambulate at carry items at the same time                Cognition   Behavior During Therapy: [facility] for tasks assessed/performed Overall Cognitive Status: Within Functional Limits for tasks assessed                                    Pertinent Vitals/ Pain       Pain Assessment: Faces Faces Pain Scale: Hurts a little bit Pain Location: right hip Pain Descriptors / Indicators: Guarding Pain Intervention(s): Monitored during session;Repositioned         Frequency Min 2X/week     Progress Toward Goals  OT Goals(current goals can now be found in the care plan section)  Progress towards OT goals: Not progressing toward goals - comment (same as yesterday, but is showing increased safety with RW use this session over last)     Plan Discharge plan remains appropriate       End of Session Equipment Utilized During Treatment:  Rolling walker   Activity Tolerance Patient tolerated treatment well   Patient Left in chair;with call bell/phone within reach   Nurse Communication Mobility status (cues for RW use, I emptied 1050 ccs of urine from urinal)        Time: [id number] OT Time Calculation (min): 29 min  Charges: OT General Charges $OT Visit: 1 Procedure OT Treatments $Self Care/Home Management : 23-37 mins  Almon Register [id number] 03/01/2015, 3:20 PM

## 2015-03-01 NOTE — Progress Notes (Signed)
Thomas [name] TEAM 1 - Stepdown/ICU TEAM PROGRESS NOTE  Thomas [name] [id number] DOB: [birth year] DOA: 02/25/2015 PCP: No PCP Per Patient  Admit HPI / Brief Narrative: 64 y.o. M w/ a Hx Chronic A. fib not on anticoagulation due to thrombocytopenia/EtOH abuse, HTN, Chronic Systolic CHF/Dilated Cardiomyopathy, EtOH abuse, ETOH Liver Dz, Noncompliance, Obesity, arthritis, Chronic Rt hip pain, and anemia who presented to the ED diaphoretic with presyncope and complaining of right knee and hip pain. Initial evaluation revealed A. fib with RVR, metabolic acidosis, hypotension.  He stated he has chronic pain in his right hip and right knee making it difficult to bear weight and leading to frequent falls. He drinks 4-6 24 ounce beers daily. He reported having run out of medications several months prior.   HPI/Subjective: The patient is resting comfortably in a bedside chair.  He has been able to get up and ambulate some today and is doing reasonably well despite his right hip issues.  Over the last 24 hours he has suffered episodes of hypotension necessitating holding of his calcium channel blocker.  He currently denies chest pain shortness breath fevers chills or abdominal pain.  Assessment/Plan:  Chronic Atrial fibrillation with acute RVR   -acute RVR multifactorial to include noncompliance with medication, and alcohol abuse  -has now been transitioned to oral medical tx -discontinue calcium channel blocker and attempt to control on beta blocker alone due to hypotension  Chronic Systolic CHF -EF [id number] May [id number] with moderate RV dysfunction -Echocardiogram now notes EF improved to 40-45% w/ diffuse hypokinesis  -net + ~2.2L since admit  -Currently 124.6 kg which appears to be his approximate baseline weight (124-126kg)  ETOH abuse -Drinks 4-6 24 ounce beers daily -Patient has been counseled on the need to abstain from alcohol entirely  -No evidence of active withdrawal at present  Alcoholic  liver disease   -MELD 9 - abstinence from EtOH most appropriate intervention at this time   Non compliance w medication regimen -Case management consult for help in obtaining PCP  Right hip pain / OA severe Rt Hip, Moderate Lt Hip -pt has no acute fx in hip region but instead has progressive severe degenerative changes, to include AVN of the R femoral head - he is now essentially becoming non-ambulatory -Patient to follow-up with Dr. [name] after discharge at the [facility] medicine clinic as arranged by Dr. Sherral Hammers  Acute kidney injury  -Resolved  Anemia/thrombocytosis -EGD and colonoscopy in 2015 revealed portal hypertensive gastropathy in the gastric fundus and gastric body, acute gastritis in the gastric antrum, small hiatal hernia, and inflammation in the duodenal bulb -transfused 2U PRBC this admit w/ only modest change in Hgb -Hgb stable  -repeat GI w/u likely would simply confirm ongoing presence of these issues as pt continues to abuse EtOH -Platelet count close to baseline  Hyponatremia -beer drinkers potomania +/- cirrhosis   Hypokalemia -Supplement as indicated and follow  MRSA screen +  Code Status: FULL Family Communication: no family present at time of exam Disposition Plan: plan for d/c home in AM if BP stable   Consultants: [facility]   Procedures: TTE - as discussed above   Antibiotics: none  DVT prophylaxis: SCDs  Objective: Blood pressure 81/56, pulse 90, temperature 98.5 F (36.9 C), temperature source Oral, resp. rate 16, height 6\' 1"  (1.854 m), weight 124.6 kg (274 lb 11.1 oz), SpO2 96 %.  Intake/Output Summary (Last 24 hours) at 03/01/15 1624 Last data filed at 03/01/15 1400  Gross per  24 hour  Intake    960 ml  Output   2250 ml  Net  -1290 ml   Exam: General: No acute respiratory distress - alert  Lungs: Clear to auscultation bilaterally without wheezes / crackles Cardiovascular: Regular rate and rhythm without murmur    Abdomen: Nontender, nondistended, soft, bowel sounds positive, no rebound, no ascites, no appreciable mass Extremities: No significant cyanosis, or clubbing;  Trace edema bilateral lower extremities  Data Reviewed: Basic Metabolic Panel:  Recent Labs Lab 02/25/15 1844 02/26/15 0648 02/26/15 1231 02/27/15 0210 02/28/15 0236 03/01/15 0307  NA 126* 127*  --  128* 125* 132*  K 3.6 3.3* 4.1 3.5 3.6 3.5  CL 89* 92*  --  92* 90* 95*  CO2 24 26  --  28 26 27   GLUCOSE 126* 126*  --  119* 110* 101*  BUN 14 11  --  12 10 9   CREATININE 1.12 0.79  --  0.86 0.86 0.99  CALCIUM 8.5* 8.0*  --  8.1* 8.2* 8.5*  MG  --   --  1.8 1.7  --  1.8    CBC:  Recent Labs Lab 02/25/15 1353 02/25/15 1844 02/26/15 0648 02/27/15 0210 02/28/15 0236 03/01/15 0307  WBC 6.7  --  4.6 4.9 5.8 5.0  NEUTROABS 5.9  --   --  3.4  --   --   HGB 8.9*  --  7.5* 8.4* 8.7* 8.4*  HCT 25.1* 24.0* 21.0* 23.7* 24.0* 24.0*  MCV 108.7*  --  105.0* 99.6 99.6 100.4*  PLT 96*  --  119* 113* 141* 151    Liver Function Tests:  Recent Labs Lab 02/25/15 1255 02/27/15 0210 02/28/15 0236  AST 99* 103* 121*  ALT 25 25 29   ALKPHOS 63 58 60  BILITOT 4.3* 3.5* 1.9*  PROT 5.6* 5.1* 5.1*  ALBUMIN 3.0* 2.5* 2.5*   CBG:  Recent Labs Lab 02/28/15 1123 02/28/15 1631 02/28/15 2148 03/01/15 0732 03/01/15 1134  GLUCAP 137* 114* 123* 109* 124*    Recent Results (from the past 240 hour(s))  Blood culture (routine x 2)     Status: None (Preliminary result)   Collection Time: 02/25/15 12:50 PM  Result Value Ref Range Status   Specimen Description BLOOD LEFT ANTECUBITAL  Final   Special Requests BOTTLES DRAWN AEROBIC AND ANAEROBIC 10MLS  Final   Culture NO GROWTH 4 DAYS  Final   Report Status PENDING  Incomplete  Blood culture (routine x 2)     Status: None (Preliminary result)   Collection Time: 02/25/15  1:52 PM  Result Value Ref Range Status   Specimen Description BLOOD LEFT HAND  Final   Special Requests    Final    BOTTLES DRAWN AEROBIC AND ANAEROBIC 3MLS RED 5MLS BLUE   Culture NO GROWTH 4 DAYS  Final   Report Status PENDING  Incomplete  MRSA PCR Screening     Status: Abnormal   Collection Time: 02/25/15  4:48 PM  Result Value Ref Range Status   MRSA by PCR POSITIVE (A) NEGATIVE Final    Comment:        The GeneXpert MRSA Assay (FDA approved for NASAL specimens only), is one component of a comprehensive MRSA colonization surveillance program. It is not intended to diagnose MRSA infection nor to guide or monitor treatment for MRSA infections. RESULT CALLED TO, READ BACK BY AND VERIFIED WITH: C.RICE,RN AT 1856 BY L.PITT 02/25/15      Studies:   Recent x-ray studies have been  reviewed in detail by the Attending Physician  Scheduled Meds:  Scheduled Meds: . carvedilol  6.25 mg Oral BID WC  . Chlorhexidine Gluconate Cloth  6 each Topical Q0600  . diltiazem  120 mg Oral Q24H  . folic acid  1 mg Oral Daily  . furosemide  40 mg Oral Daily  . insulin aspart  0-5 Units Subcutaneous QHS  . insulin aspart  0-9 Units Subcutaneous TID WC  . losartan  25 mg Oral Daily  . multivitamin with minerals  1 tablet Oral Daily  . mupirocin ointment  1 application Nasal BID  . pantoprazole  40 mg Oral Daily  . thiamine  100 mg Oral Daily  . vitamin B-12  1,000 mcg Oral Daily    Time spent on care of this patient: 25 mins   [facility] T , MD   [facility] Office  [phone] Pager - Text Page per Shea Evans as per below:  On-Call/Text Page:      [email or web address]      password [email or web address]  If 7PM-7AM, please contact night-coverage [email or web address] Password [email or web address] 03/01/2015, 4:24 PM   LOS: 4 days

## 2015-03-01 NOTE — Care Management Note (Signed)
Case Management Note  Patient Details  Name: Cullan Fearrington MRN: [id number] Date of Birth: 04-03-1951  Subjective/Objective:   Adm w at fib, hip pain                 Action/Plan:pt lives w mother. Mother does cooking. Pt prefers home and phy there now rec hhpt/ot.   Expected Discharge Date:                  Expected Discharge Plan:  [place]  In-House Referral:  Clinical Social Work  Discharge planning Services  CM Consult  Post Acute Care Choice:    Choice offered to:     DME Arranged:    DME Agency:     HH Arranged:  PT, OT [place] Agency:  [facility]  Status of Service:     Medicare Important Message Given:    Date Medicare IM Given:    Medicare IM give by:    Date Additional Medicare IM Given:    Additional Medicare Important Message give by:     If discussed at [place] of Stay Meetings, dates discussed:    Additional Comments: went over list of hhc agencies. No pref. Ref to [facility] w [facility] for hhpt/hhot. Await final orders.  Lacretia Leigh, RN 03/01/2015, 10:53 AM

## 2015-03-02 LAB — CULTURE, BLOOD (ROUTINE X 2)
CULTURE: NO GROWTH
Culture: NO GROWTH

## 2015-03-02 LAB — GLUCOSE, CAPILLARY
GLUCOSE-CAPILLARY: 135 mg/dL — AB (ref 65–99)
Glucose-Capillary: 95 mg/dL (ref 65–99)

## 2015-03-02 MED ORDER — CARVEDILOL 6.25 MG PO TABS: 6.2500 mg | ORAL_TABLET | Freq: Two times a day (BID) | ORAL | Status: DC

## 2015-03-02 MED ORDER — LOSARTAN POTASSIUM 25 MG PO TABS: 12.5000 mg | ORAL_TABLET | Freq: Every day | ORAL | Status: DC

## 2015-03-02 MED ORDER — FOLIC ACID 1 MG PO TABS: 1.0000 mg | ORAL_TABLET | Freq: Every day | ORAL | Status: DC

## 2015-03-02 MED ORDER — POTASSIUM CHLORIDE CRYS ER 20 MEQ PO TBCR
40.0000 meq | EXTENDED_RELEASE_TABLET | Freq: Every day | ORAL | Status: DC
  Administered 2015-03-02: 40 meq via ORAL
  Filled 2015-03-02: qty 2

## 2015-03-02 MED ORDER — FUROSEMIDE 40 MG PO TABS: 40.0000 mg | ORAL_TABLET | Freq: Every day | ORAL | Status: DC

## 2015-03-02 MED ORDER — THIAMINE HCL 100 MG PO TABS: 100.0000 mg | ORAL_TABLET | Freq: Every day | ORAL | Status: DC

## 2015-03-02 MED ORDER — POTASSIUM CHLORIDE CRYS ER 20 MEQ PO TBCR: 40.0000 meq | EXTENDED_RELEASE_TABLET | Freq: Every day | ORAL | Status: DC

## 2015-03-02 NOTE — Clinical Social Work Note (Signed)
Clinical Social Worker facilitated patient discharge including contacting patient and facility to confirm patient discharge plans.  Clinical information faxed to facility and patient agreeable with plan.  CSW arranged ambulance transport via [facility] to [facility].  RN to call report prior to discharge.  Clinical Social Worker will sign off for now as social work intervention is no longer needed. Please consult us again if new need arises.  Jesse Nayelis Bonito, LCSW [phone] 

## 2015-03-02 NOTE — Discharge Summary (Signed)
DISCHARGE SUMMARY  Thomas [name]  MR#: [id number]  DOB:10/31/1950  Date of Admission: 02/25/2015 Date of Discharge: 03/02/2015  Attending Physician:MCCLUNG,JEFFREY T  Patient's PCP:No PCP Per Patient  Consults: [facility]  Disposition: D/C home   Follow-up Appts:     Follow-up Information    Follow up with [name], PA-C On 03/26/2015.   Specialty:  Cardiology   Why:  Cardiology Hospital Follow-Up on 03/26/2015 at 9:45AM.   Contact information:   [address] [place] [address] [phone]       Follow up with [name], DO. Schedule an appointment as soon as possible for a visit in 1 week.   Specialties:  Sports Medicine, Family Medicine   Contact information:   [address] [place] [address] [phone]      Tests Needing Follow-up: -assessment of K+ and renal function is suggested -evaluation of volume status  -encourage abstinence from EtOH  Discharge Diagnoses: Chronic Atrial fibrillation with acute RVR  Chronic Systolic CHF ETOH abuse Alcoholic liver disease  Non compliance w/ medication regimen Right hip pain / OA severe Rt Hip, Moderate Lt Hip Acute kidney injury  Anemia/thrombocytopenia Hyponatremia Hypokalemia MRSA screen +  Initial presentation: 64 y.o. M w/ a Hx Chronic A. fib not on anticoagulation due to thrombocytopenia/EtOH abuse, HTN, Chronic Systolic CHF/Dilated Cardiomyopathy, EtOH abuse, ETOH Liver Dz, Noncompliance, Obesity, arthritis, Chronic Rt hip pain, and anemia who presented to the ED diaphoretic with presyncope and complaining of right knee and hip pain. Initial evaluation revealed A. fib with RVR, metabolic acidosis, hypotension. He stated he has chronic pain in his right hip and right knee making it difficult to bear weight and leading to frequent falls. He drinks 4-6 24 ounce beers daily. He reported having run out of medications several months prior.   Hospital  Course:  Chronic Atrial fibrillation with acute RVR  -acute RVR multifactorial to include noncompliance with medication and alcohol abuse  -transitioned to oral medical tx after rate control accomplished using IV drips -discontinued calcium channel blocker and utilized beta blocker alone for rate control -At time of discharge rate is well-controlled and blood pressure is stable in the 0000000 systolic range  Chronic Systolic CHF -EF [id number] May [id number] with moderate RV dysfunction -Echocardiogram this admit noted EF improved to 40-45% w/ diffuse hypokinesis  -net - ~3.5L since admit  -Currently 124.4 kg which appears to be his approximate baseline weight (124-126kg)  ETOH abuse -Drinks 4-6 24 ounce beers daily -Patient has been counseled on the need to abstain from alcohol entirely  -No evidence of active withdrawal during this hospital stay  Alcoholic liver disease  -MELD 9 - abstinence from EtOH most appropriate intervention at this time   Non compliance w medication regimen -Case management consulted for help in obtaining PCP  Right hip pain / OA severe Rt Hip, Moderate Lt Hip -pt has no acute fx in hip region but instead has progressive severe degenerative changes, to include AVN of the R femoral head - he is able to ambulate at time of discharge but with pain -Patient to follow-up with Dr. [name] after discharge at the [facility] sports medicine clinic as arranged by Dr. Sherral Hammers  Acute kidney injury  -Resolved  Anemia/thrombocytopenia  -EGD and colonoscopy in 2015 revealed portal hypertensive gastropathy in the gastric fundus and gastric body, acute gastritis in the gastric antrum, small hiatal hernia, and inflammation in the duodenal bulb -transfused 2U PRBC this admit w/ only modest change  in Hgb -Hgb stable  -repeat GI w/u likely would simply confirm ongoing presence of these issues as pt continues to abuse EtOH -Platelet count close to  baseline  Hyponatremia -beer drinkers potomania +/- cirrhosis   Hypokalemia -Supplement as indicated and follow  MRSA screen +    Medication List    STOP taking these medications        digoxin 0.125 MG tablet  Commonly known as:  LANOXIN     naproxen sodium 220 MG tablet  Commonly known as:  ANAPROX      TAKE these medications        aspirin 81 MG EC tablet  Take 1 tablet (81 mg total) by mouth daily.     carvedilol 6.25 MG tablet  Commonly known as:  COREG  Take 1 tablet (6.25 mg total) by mouth 2 (two) times daily with a meal.     cyanocobalamin 1000 MCG tablet  Take 1 tablet (1,000 mcg total) by mouth daily.     folic acid 1 MG tablet  Commonly known as:  FOLVITE  Take 1 tablet (1 mg total) by mouth daily.     furosemide 40 MG tablet  Commonly known as:  LASIX  Take 1 tablet (40 mg total) by mouth daily.     losartan 25 MG tablet  Commonly known as:  COZAAR  Take 0.5 tablets (12.5 mg total) by mouth daily.     multivitamin with minerals Tabs tablet  Take 1 tablet by mouth daily.     potassium chloride SA 20 MEQ tablet  Commonly known as:  K-DUR,KLOR-CON  Take 2 tablets (40 mEq total) by mouth daily.     thiamine 100 MG tablet  Take 1 tablet (100 mg total) by mouth daily.        Day of Discharge BP 117/74 mmHg  Pulse 91  Temp(Src) 98.2 F (36.8 C) (Oral)  Resp 13  Ht 6\' 1"  (1.854 m)  Wt 124.422 kg (274 lb 4.8 oz)  BMI 36.20 kg/m2  SpO2 97%  Physical Exam: General: No acute respiratory distress Lungs: Clear to auscultation bilaterally without wheezes or crackles Cardiovascular: Regular rate without murmur gallop or rub normal S1 and S2 Abdomen: Nontender, nondistended, soft, bowel sounds positive, no rebound, no ascites, no appreciable mass Extremities: No significant cyanosis, or clubbing;  trace edema bilateral lower extremities  Basic Metabolic Panel:  Recent Labs Lab 02/25/15 1844 02/26/15 0648 02/26/15 1231 02/27/15 0210  02/28/15 0236 03/01/15 0307  NA 126* 127*  --  128* 125* 132*  K 3.6 3.3* 4.1 3.5 3.6 3.5  CL 89* 92*  --  92* 90* 95*  CO2 24 26  --  28 26 27   GLUCOSE 126* 126*  --  119* 110* 101*  BUN 14 11  --  12 10 9   CREATININE 1.12 0.79  --  0.86 0.86 0.99  CALCIUM 8.5* 8.0*  --  8.1* 8.2* 8.5*  MG  --   --  1.8 1.7  --  1.8    Liver Function Tests:  Recent Labs Lab 02/25/15 1255 02/27/15 0210 02/28/15 0236  AST 99* 103* 121*  ALT 25 25 29   ALKPHOS 63 58 60  BILITOT 4.3* 3.5* 1.9*  PROT 5.6* 5.1* 5.1*  ALBUMIN 3.0* 2.5* 2.5*   Coags:  Recent Labs Lab 02/28/15 0236  INR 1.13   CBC:  Recent Labs Lab 02/25/15 1353 02/25/15 1844 02/26/15 0648 02/27/15 0210 02/28/15 0236 03/01/15 0307  WBC 6.7  --  4.6 4.9 5.8 5.0  NEUTROABS 5.9  --   --  3.4  --   --   HGB 8.9*  --  7.5* 8.4* 8.7* 8.4*  HCT 25.1* 24.0* 21.0* 23.7* 24.0* 24.0*  MCV 108.7*  --  105.0* 99.6 99.6 100.4*  PLT 96*  --  119* 113* 141* 151   BNP (last 3 results)  Recent Labs  02/25/15 1150  BNP 236.2*   CBG:  Recent Labs Lab 03/01/15 0732 03/01/15 1134 03/01/15 1554 03/01/15 2145 03/02/15 0820  GLUCAP 109* 124* 133* 108* 95    Recent Results (from the past 240 hour(s))  Blood culture (routine x 2)     Status: None (Preliminary result)   Collection Time: 02/25/15 12:50 PM  Result Value Ref Range Status   Specimen Description BLOOD LEFT ANTECUBITAL  Final   Special Requests BOTTLES DRAWN AEROBIC AND ANAEROBIC 10MLS  Final   Culture NO GROWTH 4 DAYS  Final   Report Status PENDING  Incomplete  Blood culture (routine x 2)     Status: None (Preliminary result)   Collection Time: 02/25/15  1:52 PM  Result Value Ref Range Status   Specimen Description BLOOD LEFT HAND  Final   Special Requests   Final    BOTTLES DRAWN AEROBIC AND ANAEROBIC 3MLS RED 5MLS BLUE   Culture NO GROWTH 4 DAYS  Final   Report Status PENDING  Incomplete  MRSA PCR Screening     Status: Abnormal   Collection Time:  02/25/15  4:48 PM  Result Value Ref Range Status   MRSA by PCR POSITIVE (A) NEGATIVE Final    Comment:        The GeneXpert MRSA Assay (FDA approved for NASAL specimens only), is one component of a comprehensive MRSA colonization surveillance program. It is not intended to diagnose MRSA infection nor to guide or monitor treatment for MRSA infections. RESULT CALLED TO, READ BACK BY AND VERIFIED WITH: C.RICE,RN AT 1856 BY L.PITT 02/25/15     Time spent in discharge (includes decision making & examination of pt): >35 minutes  03/02/2015, 9:54 AM   Cherene Altes, MD [facility] Office  [phone] Pager [phone]  On-Call/Text Page:      [email or web address]      password [facility]

## 2015-03-02 NOTE — Discharge Instructions (Signed)
Alcohol Use Disorder °Alcohol use disorder is a mental disorder. It is not a one-time incident of heavy drinking. Alcohol use disorder is the excessive and uncontrollable use of alcohol over time that leads to problems with functioning in one or more areas of daily living. People with this disorder risk harming themselves and others when they drink to excess. Alcohol use disorder also can cause other mental disorders, such as mood and anxiety disorders, and serious physical problems. People with alcohol use disorder often misuse other drugs.  °Alcohol use disorder is common and widespread. Some people with this disorder drink alcohol to cope with or escape from negative life events. Others drink to relieve chronic pain or symptoms of mental illness. People with a family history of alcohol use disorder are at higher risk of losing control and using alcohol to excess.  °Drinking too much alcohol can cause injury, accidents, and health problems. One drink can be too much when you are: °· Working. °· Pregnant or breastfeeding. °· Taking medicines. Ask your doctor. °· Driving or planning to drive. °SYMPTOMS  °Signs and symptoms of alcohol use disorder may include the following:  °· Consumption of alcohol in larger amounts or over a longer period of time than intended. °· Multiple unsuccessful attempts to cut down or control alcohol use.   °· A great deal of time spent obtaining alcohol, using alcohol, or recovering from the effects of alcohol (hangover). °· A strong desire or urge to use alcohol (cravings).   °· Continued use of alcohol despite problems at work, school, or home because of alcohol use.   °· Continued use of alcohol despite problems in relationships because of alcohol use. °· Continued use of alcohol in situations when it is physically hazardous, such as driving a car. °· Continued use of alcohol despite awareness of a physical or psychological problem that is likely related to alcohol use. Physical  problems related to alcohol use can involve the brain, heart, liver, stomach, and intestines. Psychological problems related to alcohol use include intoxication, depression, anxiety, psychosis, delirium, and dementia.   °· The need for increased amounts of alcohol to achieve the same desired effect, or a decreased effect from the consumption of the same amount of alcohol (tolerance). °· Withdrawal symptoms upon reducing or stopping alcohol use, or alcohol use to reduce or avoid withdrawal symptoms. Withdrawal symptoms include: °¨ Racing heart. °¨ Hand tremor. °¨ Difficulty sleeping. °¨ Nausea. °¨ Vomiting. °¨ Hallucinations. °¨ Restlessness. °¨ Seizures. °DIAGNOSIS °Alcohol use disorder is diagnosed through an assessment by your health care provider. Your health care provider may start by asking three or four questions to screen for excessive or problematic alcohol use. To confirm a diagnosis of alcohol use disorder, at least two symptoms must be present within a 12-month period. The severity of alcohol use disorder depends on the number of symptoms: °· Mild--two or three. °· Moderate--four or five. °· Severe--six or more. °Your health care provider may perform a physical exam or use results from lab tests to see if you have physical problems resulting from alcohol use. Your health care provider may refer you to a mental health professional for evaluation. °TREATMENT  °Some people with alcohol use disorder are able to reduce their alcohol use to low-risk levels. Some people with alcohol use disorder need to quit drinking alcohol. When necessary, mental health professionals with specialized training in substance use treatment can help. Your health care provider can help you decide how severe your alcohol use disorder is and what type of treatment you need.   The following forms of treatment are available:   Detoxification. Detoxification involves the use of prescription medicines to prevent alcohol withdrawal  symptoms in the first week after quitting. This is important for people with a history of symptoms of withdrawal and for heavy drinkers who are likely to have withdrawal symptoms. Alcohol withdrawal can be dangerous and, in severe cases, cause death. Detoxification is usually provided in a hospital or in-patient substance use treatment facility.  Counseling or talk therapy. Talk therapy is provided by substance use treatment counselors. It addresses the reasons people use alcohol and ways to keep them from drinking again. The goals of talk therapy are to help people with alcohol use disorder find healthy activities and ways to cope with life stress, to identify and avoid triggers for alcohol use, and to handle cravings, which can cause relapse.  Medicines.Different medicines can help treat alcohol use disorder through the following actions:  Decrease alcohol cravings.  Decrease the positive reward response felt from alcohol use.  Produce an uncomfortable physical reaction when alcohol is used (aversion therapy).  Support groups. Support groups are run by people who have quit drinking. They provide emotional support, advice, and guidance. These forms of treatment are often combined. Some people with alcohol use disorder benefit from intensive combination treatment provided by specialized substance use treatment centers. Both inpatient and outpatient treatment programs are available.   This information is not intended to replace advice given to you by your health care provider. Make sure you discuss any questions you have with your health care provider.   Document Released: 05/14/2004 Document Revised: 04/27/2014 Document Reviewed: 07/14/2012 Elsevier Interactive Patient Education 2016 [employer]  Heart Failure Heart failure is a condition in which the heart has trouble pumping blood. This means your heart does not pump blood efficiently for your body to work well. In some cases of heart  failure, fluid may back up into your lungs or you may have swelling (edema) in your lower legs. Heart failure is usually a long-term (chronic) condition. It is important for you to take good care of yourself and follow your health care provider's treatment plan. CAUSES  Some health conditions can cause heart failure. Those health conditions include:  High blood pressure (hypertension). Hypertension causes the heart muscle to work harder than normal. When pressure in the blood vessels is high, the heart needs to pump (contract) with more force in order to circulate blood throughout the body. High blood pressure eventually causes the heart to become stiff and weak.  Coronary artery disease (CAD). CAD is the buildup of cholesterol and fat (plaque) in the arteries of the heart. The blockage in the arteries deprives the heart muscle of oxygen and blood. This can cause chest pain and may lead to a heart attack. High blood pressure can also contribute to CAD.  Heart attack (myocardial infarction). A heart attack occurs when one or more arteries in the heart become blocked. The loss of oxygen damages the muscle tissue of the heart. When this happens, part of the heart muscle dies. The injured tissue does not contract as well and weakens the heart's ability to pump blood.  Abnormal heart valves. When the heart valves do not open and close properly, it can cause heart failure. This makes the heart muscle pump harder to keep the blood flowing.  Heart muscle disease (cardiomyopathy or myocarditis). Heart muscle disease is damage to the heart muscle from a variety of causes. These can include drug or alcohol abuse, infections,  or unknown reasons. These can increase the risk of heart failure.  Lung disease. Lung disease makes the heart work harder because the lungs do not work properly. This can cause a strain on the heart, leading it to fail.  Diabetes. Diabetes increases the risk of heart failure. High blood  sugar contributes to high fat (lipid) levels in the blood. Diabetes can also cause slow damage to tiny blood vessels that carry important nutrients to the heart muscle. When the heart does not get enough oxygen and food, it can cause the heart to become weak and stiff. This leads to a heart that does not contract efficiently.  Other conditions can contribute to heart failure. These include abnormal heart rhythms, thyroid problems, and low blood counts (anemia). Certain unhealthy behaviors can increase the risk of heart failure, including:  Being overweight.  Smoking or chewing tobacco.  Eating foods high in fat and cholesterol.  Abusing illicit drugs or alcohol.  Lacking physical activity. SYMPTOMS  Heart failure symptoms may vary and can be hard to detect. Symptoms may include:  Shortness of breath with activity, such as climbing stairs.  Persistent cough.  Swelling of the feet, ankles, legs, or abdomen.  Unexplained weight gain.  Difficulty breathing when lying flat (orthopnea).  Waking from sleep because of the need to sit up and get more air.  Rapid heartbeat.  Fatigue and loss of energy.  Feeling light-headed, dizzy, or close to fainting.  Loss of appetite.  Nausea.  Increased urination during the night (nocturia). DIAGNOSIS  A diagnosis of heart failure is based on your history, symptoms, physical examination, and diagnostic tests. Diagnostic tests for heart failure may include:  Echocardiography.  Electrocardiography.  Chest X-ray.  Blood tests.  Exercise stress test.  Cardiac angiography.  Radionuclide scans. TREATMENT  Treatment is aimed at managing the symptoms of heart failure. Medicines, behavioral changes, or surgical intervention may be necessary to treat heart failure.  Medicines to help treat heart failure may include:  Angiotensin-converting enzyme (ACE) inhibitors. This type of medicine blocks the effects of a blood protein called  angiotensin-converting enzyme. ACE inhibitors relax (dilate) the blood vessels and help lower blood pressure.  Angiotensin receptor blockers (ARBs). This type of medicine blocks the actions of a blood protein called angiotensin. Angiotensin receptor blockers dilate the blood vessels and help lower blood pressure.  Water pills (diuretics). Diuretics cause the kidneys to remove salt and water from the blood. The extra fluid is removed through urination. This loss of extra fluid lowers the volume of blood the heart pumps.  Beta blockers. These prevent the heart from beating too fast and improve heart muscle strength.  Digitalis. This increases the force of the heartbeat.  Healthy behavior changes include:  Obtaining and maintaining a healthy weight.  Stopping smoking or chewing tobacco.  Eating heart-healthy foods.  Limiting or avoiding alcohol.  Stopping illicit drug use.  Physical activity as directed by your health care provider.  Surgical treatment for heart failure may include:  A procedure to open blocked arteries, repair damaged heart valves, or remove damaged heart muscle tissue.  A pacemaker to improve heart muscle function and control certain abnormal heart rhythms.  An internal cardioverter defibrillator to treat certain serious abnormal heart rhythms.  A left ventricular assist device (LVAD) to assist the pumping ability of the heart. HOME CARE INSTRUCTIONS   Take medicines only as directed by your health care provider. Medicines are important in reducing the workload of your heart, slowing the progression of  heart failure, and improving your symptoms.  Do not stop taking your medicine unless directed by your health care provider.  Do not skip any dose of medicine.  Refill your prescriptions before you run out of medicine. Your medicines are needed every day.  Engage in moderate physical activity if directed by your health care provider. Moderate physical activity  can benefit some people. The elderly and people with severe heart failure should consult with a health care provider for physical activity recommendations.  Eat heart-healthy foods. Food choices should be free of trans fat and low in saturated fat, cholesterol, and salt (sodium). Healthy choices include fresh or frozen fruits and vegetables, fish, lean meats, legumes, fat-free or low-fat dairy products, and whole grain or high fiber foods. Talk to a dietitian to learn more about heart-healthy foods.  Limit sodium if directed by your health care provider. Sodium restriction may reduce symptoms of heart failure in some people. Talk to a dietitian to learn more about heart-healthy seasonings.  Use healthy cooking methods. Healthy cooking methods include roasting, grilling, broiling, baking, poaching, steaming, or stir-frying. Talk to a dietitian to learn more about healthy cooking methods.  Limit fluids if directed by your health care provider. Fluid restriction may reduce symptoms of heart failure in some people.  Weigh yourself every day. Daily weights are important in the early recognition of excess fluid. You should weigh yourself every morning after you urinate and before you eat breakfast. Wear the same amount of clothing each time you weigh yourself. Record your daily weight. Provide your health care provider with your weight record.  Monitor and record your blood pressure if directed by your health care provider.  Check your pulse if directed by your health care provider.  Lose weight if directed by your health care provider. Weight loss may reduce symptoms of heart failure in some people.  Stop smoking or chewing tobacco. Nicotine makes your heart work harder by causing your blood vessels to constrict. Do not use nicotine gum or patches before talking to your health care provider.  Keep all follow-up visits as directed by your health care provider. This is important.  Limit alcohol intake  to no more than 1 drink per day for nonpregnant women and 2 drinks per day for men. One drink equals 12 ounces of beer, 5 ounces of wine, or 1 ounces of hard liquor. Drinking more than that is harmful to your heart. Tell your health care provider if you drink alcohol several times a week. Talk with your health care provider about whether alcohol is safe for you. If your heart has already been damaged by alcohol or you have severe heart failure, drinking alcohol should be stopped completely.  Stop illicit drug use.  Stay up-to-date with immunizations. It is especially important to prevent respiratory infections through current pneumococcal and influenza immunizations.  Manage other health conditions such as hypertension, diabetes, thyroid disease, or abnormal heart rhythms as directed by your health care provider.  Learn to manage stress.  Plan rest periods when fatigued.  Learn strategies to manage high temperatures. If the weather is extremely hot:  Avoid vigorous physical activity.  Use air conditioning or fans or seek a cooler location.  Avoid caffeine and alcohol.  Wear loose-fitting, lightweight, and light-colored clothing.  Learn strategies to manage cold temperatures. If the weather is extremely cold:  Avoid vigorous physical activity.  Layer clothes.  Wear mittens or gloves, a hat, and a scarf when going outside.  Avoid alcohol.  Obtain ongoing education and support as needed.  Participate in or seek rehabilitation as needed to maintain or improve independence and quality of life. SEEK MEDICAL CARE IF:   You have a rapid weight gain.  You have increasing shortness of breath that is unusual for you.  You are unable to participate in your usual physical activities.  You tire easily.  You cough more than normal, especially with physical activity.  You have any or more swelling in areas such as your hands, feet, ankles, or abdomen.  You are unable to sleep  because it is hard to breathe.  You feel like your heart is beating fast (palpitations).  You become dizzy or light-headed upon standing up. SEEK IMMEDIATE MEDICAL CARE IF:   You have difficulty breathing.  There is a change in mental status such as decreased alertness or difficulty with concentration.  You have a pain or discomfort in your chest.  You have an episode of fainting (syncope). MAKE SURE YOU:   Understand these instructions.  Will watch your condition.  Will get help right away if you are not doing well or get worse.   This information is not intended to replace advice given to you by your health care provider. Make sure you discuss any questions you have with your health care provider.   Document Released: 04/06/2005 Document Revised: 08/21/2014 Document Reviewed: 05/06/2012 Elsevier Interactive Patient Education 2016 Elsevier Inc.  Atrial Fibrillation Atrial fibrillation is a type of heartbeat that is irregular or fast (rapid). If you have this condition, your heart keeps quivering in a weird (chaotic) way. This condition can make it so your heart cannot pump blood normally. Having this condition gives a person more risk for stroke, heart failure, and other heart problems. There are different types of atrial fibrillation. Talk with your doctor to learn about the type that you have. HOME CARE  Take over-the-counter and prescription medicines only as told by your doctor.  If your doctor prescribed a blood-thinning medicine, take it exactly as told. Taking too much of it can cause bleeding. If you do not take enough of it, you will not have the protection that you need against stroke and other problems.  Do not use any tobacco products. These include cigarettes, chewing tobacco, and e-cigarettes. If you need help quitting, ask your doctor.  If you have apnea (obstructive sleep apnea), manage it as told by your doctor.  Do not drink alcohol.  Do not drink  beverages that have caffeine. These include coffee, soda, and tea.  Maintain a healthy weight. Do not use diet pills unless your doctor says they are safe for you. Diet pills may make heart problems worse.  Follow diet instructions as told by your doctor.  Exercise regularly as told by your doctor.  Keep all follow-up visits as told by your doctor. This is important. GET HELP IF:  You notice a change in the speed, rhythm, or strength of your heartbeat.  You are taking a blood-thinning medicine and you notice more bruising.  You get tired more easily when you move or exercise. GET HELP RIGHT AWAY IF:  You have pain in your chest or your belly (abdomen).  You have sweating or weakness.  You feel sick to your stomach (nauseous).  You notice blood in your throw up (vomit), poop (stool), or pee (urine).  You are short of breath.  You suddenly have swollen feet and ankles.  You feel dizzy.  Your suddenly get weak or numb  in your face, arms, or legs, especially if it happens on one side of your body.  You have trouble talking, trouble understanding, or both.  Your face or your eyelid droops on one side. These symptoms may be an emergency. Do not wait to see if the symptoms will go away. Get medical help right away. Call your local emergency services (911 in the [place] Do not drive yourself to the hospital.   This information is not intended to replace advice given to you by your health care provider. Make sure you discuss any questions you have with your health care provider.   Document Released: 01/14/2008 Document Revised: 12/26/2014 Document Reviewed: 08/01/2014 Elsevier Interactive Patient Education [employer].

## 2015-03-02 NOTE — Progress Notes (Signed)
Patient discharged via stretcher (PTAR) to [facility], he is alert and oriented, no complaints of pain, report called to [facility] once he left unit.

## 2015-03-02 NOTE — Progress Notes (Signed)
CM received call from RN stating plan has changed from home health to SNF.  CM called CSW  [phone] who states she is aware and will arrange.  No other CM needs were communicated.

## 2015-03-02 NOTE — Clinical Social Work Placement (Signed)
   CLINICAL SOCIAL WORK PLACEMENT  NOTE  Date:  03/02/2015  Patient Details  Name: Thomas [name] MRN: [id number] Date of Birth: Feb 05, 1951  Clinical Social Work is seeking post-discharge placement for this patient at the [place] level of care (*CSW will initial, date and re-position this form in  chart as items are completed):  Yes   Patient/family provided with [facility] Work Department's list of facilities offering this level of care within the geographic area requested by the patient (or if unable, by the patient's family).  Yes   Patient/family informed of their freedom to choose among providers that offer the needed level of care, that participate in Medicare, Medicaid or managed care program needed by the patient, have an available bed and are willing to accept the patient.  Yes   Patient/family informed of [facility]'s ownership interest in [facility] and [facility], as well as of the fact that they are under no obligation to receive care at these facilities.  PASRR submitted to EDS on 02/27/15     PASRR number received on 02/27/15     Existing PASRR number confirmed on       FL2 transmitted to all facilities in geographic area requested by pt/family on 02/27/15     FL2 transmitted to all facilities within larger geographic area on       Patient informed that his/her managed care company has contracts with or will negotiate with certain facilities, including the following:        Yes   Patient/family informed of bed offers received.  Patient chooses bed at [facility]     Physician recommends and patient chooses bed at      Patient to be transferred to [facility] on 03/02/15.  Patient to be transferred to facility by Ambulance     Patient family notified on 03/02/15 of transfer.  Name of family member notified:  Patient to notify family     PHYSICIAN Please prepare priority discharge summary,  including medications, Please prepare prescriptions, Please sign FL2, Please sign DNR     Additional Comment:    Barbette Or, [place]

## 2015-03-26 ENCOUNTER — Encounter: Payer: Self-pay | Admitting: Physician Assistant

## 2015-03-26 ENCOUNTER — Ambulatory Visit (INDEPENDENT_AMBULATORY_CARE_PROVIDER_SITE_OTHER): Payer: Commercial Managed Care - HMO | Admitting: Physician Assistant

## 2015-03-26 VITALS — BP 132/80 | HR 90 | Ht 73.0 in | Wt 280.0 lb

## 2015-03-26 DIAGNOSIS — I482 Chronic atrial fibrillation, unspecified: Secondary | ICD-10-CM

## 2015-03-26 DIAGNOSIS — F1010 Alcohol abuse, uncomplicated: Secondary | ICD-10-CM

## 2015-03-26 DIAGNOSIS — I10 Essential (primary) hypertension: Secondary | ICD-10-CM | POA: Diagnosis not present

## 2015-03-26 DIAGNOSIS — I5022 Chronic systolic (congestive) heart failure: Secondary | ICD-10-CM

## 2015-03-26 DIAGNOSIS — I420 Dilated cardiomyopathy: Secondary | ICD-10-CM

## 2015-03-26 MED ORDER — CARVEDILOL 12.5 MG PO TABS: 12.5000 mg | ORAL_TABLET | Freq: Two times a day (BID) | ORAL | Status: DC

## 2015-03-26 NOTE — Assessment & Plan Note (Signed)
Patient had recent hospitalization with atrial fib with RVR after stopping all his medications and drinking an excess of alcohol. His heart rate is better controlled but still 90s. Will increase Coreg to 12.5 mg twice a day which he was on in the past. Follow-up with Dr. Radford Pax in 2 months.

## 2015-03-26 NOTE — Assessment & Plan Note (Signed)
EF was actually 40-45% on echo in the hospital which is improved from prior EF of 30%. No symptoms of heart failure. Continue losartan, low-dose Lasix and Coreg

## 2015-03-26 NOTE — Assessment & Plan Note (Signed)
Patient states he hasn't had any alcohol since the hospital.

## 2015-03-26 NOTE — Patient Instructions (Signed)
Medication Instructions:  Your physician has recommended you make the following change in your medication:  INCREASE Carvedilol to 12.5mg  Twice Daily. An Rx has been sent to your pharmacy  Labwork: None ordered  Testing/Procedures: None ordered  Follow-Up: Your physician recommends that you schedule a follow-up appointment in: 2-3 months with Dr.Turner   Any Other Special Instructions Will Be Listed Below (If Applicable).     If you need a refill on your cardiac medications before your next appointment, please call your pharmacy.

## 2015-03-26 NOTE — Assessment & Plan Note (Signed)
Compensated 

## 2015-03-26 NOTE — Progress Notes (Signed)
Cardiology Office Note   Date:  03/26/2015   ID:  Thomas [name], DOB Sep 22, 1950, MRN [id number]  PCP:  No PCP Per Patient  Cardiologist:  Dr. Radford Pax   Chief Complaint:    History of Present Illness: Thomas [name] is a 64 y.o. male who presents for post hospital follow-up. He was admitted after following a home because of right hip pain. He was found to be in atrial fibrillation(chronic) with RVR and markedly hypotensive. He was given IV fluids and heart rate was controlled with diltiazem and carvedilol, diltiazem stopped prior to discharge. Patient stopped all his medications several months prior to admission. He has a known cardiomyopathy EF 30% and a Myoview that is low risk. This is a presumed cardiomyopathy due to alcohol abuse and he still drinks beer daily. He has been noncompliant. He was not felt to be a candidate for amiodarone secondary to elevated LFTs. He was also not a candidate for anticoagulation due to Harper abuse. Repeat 2-D echo in the hospital EF 40-45%.  Patient comes in today accompanied by his nephew. He says he is taking all his medication and hasn't had any alcohol since he was in the hospital. He denies chest pain, palpitations, dyspnea, dyspnea on exertion, dizziness or presyncope. He is chronic lower edema that hasn't changed. His weight is up about pounds but he says he's been eating a lot of his mother's cooking.    Past Medical History  Diagnosis Date  . Obesity   . Arthritis   . Hypertension   . Chronic systolic CHF (congestive heart failure), NYHA class 2 ([place])   . DCM (dilated cardiomyopathy) ([place])     EF 30% by echo 07/2013 with moderate RV dysfunction and moderate MR  . Pancytopenia ([place])   . ETOH abuse   . Alcoholic liver disease ([place])   . Gastroduodenitis     H Pylori positive  . Chronic atrial fibrillation ([name])     no an anticoagulation canditate due to alcohol abuse, liver cirrhosis with pancytopenia, medical noncompliance  .  Diverticulosis of colon   . Portal hypertensive gastropathy   . Personal history of colonic polyps - adenomas 08/21/2013  . Helicobacter pylori gastritis 10/19/2013    Past Surgical History  Procedure Laterality Date  . Esophagogastroduodenoscopy N/A 08/21/2013    Procedure: ESOPHAGOGASTRODUODENOSCOPY (EGD);  Surgeon: Jerene Bears, MD;  Location: [facility] ENDOSCOPY;  Service: Endoscopy;  Laterality: N/A;  . Colonoscopy N/A 08/21/2013    Procedure: COLONOSCOPY;  Surgeon: Jerene Bears, MD;  Location: [facility] ENDOSCOPY;  Service: Endoscopy;  Laterality: N/A;     Current Outpatient Prescriptions  Medication Sig Dispense Refill  . aspirin EC 81 MG EC tablet Take 1 tablet (81 mg total) by mouth daily.    . carvedilol (COREG) 6.25 MG tablet Take 1 tablet (6.25 mg total) by mouth 2 (two) times daily with a meal. 60 tablet 0  . folic acid (FOLVITE) 1 MG tablet Take 1 tablet (1 mg total) by mouth daily.    . furosemide (LASIX) 40 MG tablet Take 1 tablet (40 mg total) by mouth daily. 30 tablet 0  . losartan (COZAAR) 25 MG tablet Take 0.5 tablets (12.5 mg total) by mouth daily. 30 tablet 0  . Multiple Vitamin (MULTIVITAMIN WITH MINERALS) TABS tablet Take 1 tablet by mouth daily.    . potassium chloride SA (K-DUR,KLOR-CON) 20 MEQ tablet Take 2 tablets (40 mEq total) by mouth daily. 30 tablet 0  . thiamine 100 MG tablet Take 1 tablet (  100 mg total) by mouth daily.     No current facility-administered medications for this visit.    Allergies:   Review of patient's allergies indicates no known allergies.    Social History:  The patient  reports that he has never smoked. He has never used smokeless tobacco. He reports that he drinks about 18.0 oz of alcohol per week. He reports that he does not use illicit drugs.   Family History:  The patient's   family history includes Hypertension in his father.    ROS:  Please see the history of present illness.   Otherwise, review of systems are positive for chronic hip pain,  diarrhea and constipation, joint swelling and balance problems.   All other systems are reviewed and negative.    PHYSICAL EXAM: VS:  Ht 6\' 1"  (1.854 m)  Wt 280 lb (127.007 kg)  BMI 36.95 kg/m2 , BMI Body mass index is 36.95 kg/(m^2). GEN: Well nourished, well developed, in no acute distress Neck: no JVD, HJR, carotid bruits, or masses Cardiac: RRR; no murmurs,gallop, rubs, thrill or heave,  Respiratory:  Decreased breath sounds but clear to auscultation bilaterally, normal work of breathing GI: soft, nontender, nondistended, + BS MS: no deformity or atrophy Extremities: Less 1 edema bilaterally without cyanosis, clubbing,  good distal pulses bilaterally.  Skin: warm and dry, no rash Neuro:  Strength and sensation are intact    EKG:  EKG is ordered today. The ekg ordered today demonstratesatrial fibrillation and 90 bpm with LVH, no acute change  Recent Labs: 02/25/2015: B Natriuretic Peptide 236.2*; TSH 2.197 02/28/2015: ALT 29 03/01/2015: BUN 9; Creatinine, Ser 0.99; Hemoglobin 8.4*; Magnesium 1.8; Platelets 151; Potassium 3.5; Sodium 132*    Lipid Panel No results found for: CHOL, TRIG, HDL, CHOLHDL, VLDL, LDLCALC, LDLDIRECT    Wt Readings from Last 3 Encounters:  03/26/15 280 lb (127.007 kg)  03/02/15 274 lb 4.8 oz (124.422 kg)  10/18/13 293 lb 6 oz (133.074 kg)      Other studies Reviewed: Additional studies/ records that were reviewed today include and review of the records demonstrates:  2-D echo 02/2015 Study Conclusions  - Left ventricle: The cavity size was normal. Wall thickness was   normal. Systolic function was mildly to moderately reduced. The   estimated ejection fraction was in the range of 40% to 45%.   Diffuse hypokinesis. - Mitral valve: There was moderate regurgitation. - Left atrium: The atrium was mildly dilated. - Right atrium: The atrium was mildly dilated. - Pulmonary arteries: PA peak pressure: 35 mm Hg (S).    ASSESSMENT AND  PLAN: Chronic atrial fibrillation [facility]) Patient had recent hospitalization with atrial fib with RVR after stopping all his medications and drinking an excess of alcohol. His heart rate is better controlled but still 90s. Will increase Coreg to 12.5 mg twice a day which he was on in the past. Follow-up with Dr. Radford Pax in 2 months.  DCM (dilated cardiomyopathy) ([place]) EF was actually 40-45% on echo in the hospital which is improved from prior EF of 30%. No symptoms of heart failure. Continue losartan, low-dose Lasix and Coreg  Chronic systolic CHF (congestive heart failure), NYHA class 2 ([place]) Compensated  ETOH abuse Patient states he hasn't had any alcohol since the hospital.     Signed, [name], PA-C  03/26/2015 9:38 AM    [address], [place], [place]  [address] Phone: [phone]; Fax: [phone]

## 2015-03-27 ENCOUNTER — Telehealth: Payer: Self-pay | Admitting: Cardiology

## 2015-03-27 ENCOUNTER — Encounter: Payer: Self-pay | Admitting: Sports Medicine

## 2015-03-27 ENCOUNTER — Ambulatory Visit (INDEPENDENT_AMBULATORY_CARE_PROVIDER_SITE_OTHER): Payer: Commercial Managed Care - HMO | Admitting: Sports Medicine

## 2015-03-27 VITALS — BP 145/88 | Ht 72.0 in | Wt 280.0 lb

## 2015-03-27 DIAGNOSIS — M1611 Unilateral primary osteoarthritis, right hip: Secondary | ICD-10-CM | POA: Diagnosis not present

## 2015-03-27 MED ORDER — CARVEDILOL 12.5 MG PO TABS: 12.5000 mg | ORAL_TABLET | Freq: Two times a day (BID) | ORAL | Status: DC

## 2015-03-27 MED ORDER — METHYLPREDNISOLONE ACETATE 80 MG/ML IJ SUSP
80.0000 mg | Freq: Once | INTRAMUSCULAR | Status: AC
  Administered 2015-03-27: 80 mg via INTRAMUSCULAR

## 2015-03-27 NOTE — Progress Notes (Signed)
   Subjective:    Patient ID: Thomas [name], male    DOB: 28-Sep-1950, 64 y.o.   MRN: [id number]  HPI   Patient comes in today with chronic right hip pain. 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 He is ready now to discuss total hip replacement.  Medical history significant for congestive heart failure    Review of Systems     Objective:   Physical Exam Well-developed, well-nourished. No acute distress  Right hip: Decreased active and passive range of motion. Pain with logroll testing. Neurovascularly intact distally. Walking with a limp.  X-rays show end-stage bone-on-bone right hip DJD secondary to AVN       Assessment & Plan:  Right hip pain secondary to end-stage DJD  Referral to Dr. Mayer Camel to discuss merits of total hip arthroplasty. I did explain to the patient that he will need medical clearance from his PCP and his cardiologist prior to surgery. He understands. We will inject him with a simple 80 mg IM Depo-Medrol injection today but I did explain to him that I do not think it will help much with his pain. Follow-up with me as needed.

## 2015-03-27 NOTE — Telephone Encounter (Signed)
New MEssage  Pt was seen 12/6 by Sharyn Lull and pt stated that a medication change for his carvedilol was not sent in to his pharmacy. Wanted it sent to the [facility] on [address] Please call back and discuss.

## 2015-03-27 NOTE — Patient Instructions (Addendum)
[facility] Dr. Mayer Camel Tuesday 04/02/15 at 2:15pm [address] [place], [place] [address] Phone: [phone]

## 2015-03-27 NOTE — Telephone Encounter (Signed)
Informed patient new Rx has been sent.  Patient grateful for call.

## 2015-04-04 ENCOUNTER — Ambulatory Visit (INDEPENDENT_AMBULATORY_CARE_PROVIDER_SITE_OTHER): Payer: Commercial Managed Care - HMO | Admitting: Internal Medicine

## 2015-04-04 ENCOUNTER — Encounter: Payer: Self-pay | Admitting: Internal Medicine

## 2015-04-04 VITALS — BP 138/84 | HR 78 | Temp 98.7°F | Resp 12 | Ht 72.0 in | Wt 271.0 lb

## 2015-04-04 DIAGNOSIS — D61818 Other pancytopenia: Secondary | ICD-10-CM

## 2015-04-04 DIAGNOSIS — K709 Alcoholic liver disease, unspecified: Secondary | ICD-10-CM | POA: Diagnosis not present

## 2015-04-04 DIAGNOSIS — I10 Essential (primary) hypertension: Secondary | ICD-10-CM | POA: Diagnosis not present

## 2015-04-04 DIAGNOSIS — I482 Chronic atrial fibrillation, unspecified: Secondary | ICD-10-CM

## 2015-04-04 DIAGNOSIS — M25551 Pain in right hip: Secondary | ICD-10-CM

## 2015-04-04 MED ORDER — LOSARTAN POTASSIUM 25 MG PO TABS: 12.5000 mg | ORAL_TABLET | Freq: Every day | ORAL | Status: DC

## 2015-04-04 MED ORDER — POTASSIUM CHLORIDE CRYS ER 20 MEQ PO TBCR: 40.0000 meq | EXTENDED_RELEASE_TABLET | Freq: Every day | ORAL | Status: DC

## 2015-04-04 MED ORDER — THIAMINE HCL 100 MG PO TABS: 100.0000 mg | ORAL_TABLET | Freq: Every day | ORAL | Status: DC

## 2015-04-04 MED ORDER — FUROSEMIDE 40 MG PO TABS: 40.0000 mg | ORAL_TABLET | Freq: Every day | ORAL | Status: DC

## 2015-04-04 MED ORDER — FOLIC ACID 1 MG PO TABS: 1.0000 mg | ORAL_TABLET | Freq: Every day | ORAL | Status: DC

## 2015-04-04 NOTE — Patient Instructions (Signed)
We have sent in the refills today for you.   I do not have any problems with you getting the hip replaced.   Make sure to keep up with the medicines and stay away from alcohol as this will hurt your heart.

## 2015-04-04 NOTE — Progress Notes (Signed)
Pre visit review using our clinic review tool, if applicable. No additional management support is needed unless otherwise documented below in the visit note. 

## 2015-04-05 MED ORDER — LIDOCAINE 5 % EX PTCH: 1.0000 | MEDICATED_PATCH | CUTANEOUS | Status: DC

## 2015-04-05 NOTE — Assessment & Plan Note (Signed)
Causes problems with walking and at site of old injury.

## 2015-04-05 NOTE — Progress Notes (Signed)
   Subjective:    Patient ID: Thomas [name], male    DOB: 06/13/1950, 64 y.o.   MRN: [id number]  HPI The patient is a 64 YO male coming in new for blood pressure. He was in the hospital back in November with alcohol induced a flutter. He does have dilated cardiomyopathy. Since that time he has stayed away from alcohol.  He next concern is his hip pain. He had injured it years ago and never got it fixed but now needs surgery. His main goal is to be healthy enough for the surgery.   PMH, [facility], social history reviewed and updated.   Review of Systems  Constitutional: Negative for fever, activity change, appetite change and fatigue.  HENT: Negative.   Eyes: Negative.   Respiratory: Negative for cough, chest tightness, shortness of breath and wheezing.   Cardiovascular: Negative for chest pain, palpitations and leg swelling.  Gastrointestinal: Negative for nausea, abdominal pain, diarrhea, constipation and abdominal distention.  Musculoskeletal: Positive for myalgias, arthralgias and gait problem. Negative for back pain.  Skin: Negative.   Neurological: Negative.   Psychiatric/Behavioral: Negative.       Objective:   Physical Exam  Constitutional: He is oriented to person, place, and time. He appears well-developed and well-nourished.  HENT:  Head: Normocephalic and atraumatic.  Eyes: EOM are normal.  Neck: Normal range of motion.  Cardiovascular: Normal rate.   Irreg irreg  Pulmonary/Chest: Effort normal and breath sounds normal. No respiratory distress. He has no wheezes. He has no rales.  Abdominal: Soft. Bowel sounds are normal. He exhibits no distension. There is no tenderness. There is no rebound.  Neurological: He is alert and oriented to person, place, and time. Coordination abnormal.  Using assistive device due to right hip pain  Skin: Skin is warm and dry.  Psychiatric: He has a normal mood and affect.   Filed Vitals:   04/04/15 1610  BP: 138/84  Pulse: 78  Temp: 98.7  F (37.1 C)  TempSrc: Oral  Resp: 12  Height: 6' (1.829 m)  Weight: 271 lb (122.925 kg)  SpO2: 96%      Assessment & Plan:

## 2015-04-05 NOTE — Assessment & Plan Note (Signed)
He is on beta blocker. Advised that he should stay away from alcohol for life. He is currently abstinent.

## 2015-04-05 NOTE — Assessment & Plan Note (Signed)
In a fib at the visit, on aspirin for anti-coagulation and beta blocker for rate control.

## 2015-04-05 NOTE — Assessment & Plan Note (Signed)
Refill of losartan and lasix. BP at goal on his medicines. Reminded him of the importance of taking his medicines everyday. Reviewed recent labs and no indication for change today.

## 2015-04-05 NOTE — Assessment & Plan Note (Signed)
Related to his liver disease and taking vitamin supplements and avoiding alcohol. Recent labs reviewed and stable. No need for recheck today.

## 2015-04-29 ENCOUNTER — Other Ambulatory Visit: Payer: Self-pay | Admitting: Geriatric Medicine

## 2015-04-29 ENCOUNTER — Telehealth: Payer: Self-pay | Admitting: Internal Medicine

## 2015-04-29 MED ORDER — POTASSIUM CHLORIDE CRYS ER 20 MEQ PO TBCR: 40.0000 meq | EXTENDED_RELEASE_TABLET | Freq: Every day | ORAL | Status: DC

## 2015-04-29 NOTE — Telephone Encounter (Signed)
Needs potassium to be sent to [facility] on [address]

## 2015-04-29 NOTE — Telephone Encounter (Signed)
Sent to pharmacy 

## 2015-05-02 ENCOUNTER — Other Ambulatory Visit: Payer: Self-pay | Admitting: Orthopedic Surgery

## 2015-05-09 ENCOUNTER — Encounter (HOSPITAL_COMMUNITY): Payer: Self-pay

## 2015-05-09 ENCOUNTER — Ambulatory Visit (HOSPITAL_COMMUNITY)
Admission: RE | Admit: 2015-05-09 | Discharge: 2015-05-09 | Disposition: A | Discharge status: Home / Self Care | Payer: Commercial Managed Care - HMO | Source: Ambulatory Visit | Attending: Orthopedic Surgery | Admitting: Orthopedic Surgery

## 2015-05-09 ENCOUNTER — Encounter (HOSPITAL_COMMUNITY)
Admission: RE | Admit: 2015-05-09 | Discharge: 2015-05-09 | Disposition: A | Discharge status: Home / Self Care | Payer: Commercial Managed Care - HMO | Source: Ambulatory Visit | Attending: Orthopedic Surgery | Admitting: Orthopedic Surgery

## 2015-05-09 DIAGNOSIS — Z01812 Encounter for preprocedural laboratory examination: Secondary | ICD-10-CM | POA: Diagnosis not present

## 2015-05-09 DIAGNOSIS — Z01818 Encounter for other preprocedural examination: Secondary | ICD-10-CM | POA: Diagnosis not present

## 2015-05-09 DIAGNOSIS — R918 Other nonspecific abnormal finding of lung field: Secondary | ICD-10-CM | POA: Diagnosis not present

## 2015-05-09 LAB — COMPREHENSIVE METABOLIC PANEL
ALBUMIN: 3.6 g/dL (ref 3.5–5.0)
ALT: 16 U/L — ABNORMAL LOW (ref 17–63)
AST: 23 U/L (ref 15–41)
Alkaline Phosphatase: 58 U/L (ref 38–126)
Anion gap: 8 (ref 5–15)
BILIRUBIN TOTAL: 0.7 mg/dL (ref 0.3–1.2)
BUN: 10 mg/dL (ref 6–20)
CHLORIDE: 104 mmol/L (ref 101–111)
CO2: 28 mmol/L (ref 22–32)
Calcium: 9.5 mg/dL (ref 8.9–10.3)
Creatinine, Ser: 0.88 mg/dL (ref 0.61–1.24)
GFR calc Af Amer: >60 mL/min (ref >60)
GFR calc non Af Amer: >60 mL/min (ref >60)
GLUCOSE: 95 mg/dL (ref 65–99)
POTASSIUM: 4.5 mmol/L (ref 3.5–5.1)
Sodium: 140 mmol/L (ref 135–145)
TOTAL PROTEIN: 7.6 g/dL (ref 6.5–8.1)

## 2015-05-09 LAB — CBC WITH DIFFERENTIAL/PLATELET
Basophils Absolute: 0 10*3/uL (ref 0.0–0.1)
Basophils Relative: 0 %
EOS ABS: 0.1 10*3/uL (ref 0.0–0.7)
Eosinophils Relative: 2 %
HEMATOCRIT: 38.3 % — AB (ref 39.0–52.0)
HEMOGLOBIN: 12.6 g/dL — AB (ref 13.0–17.0)
LYMPHS ABS: 0.9 10*3/uL (ref 0.7–4.0)
LYMPHS PCT: 18 %
MCH: 31.5 pg (ref 26.0–34.0)
MCHC: 32.9 g/dL (ref 30.0–36.0)
MCV: 95.8 fL (ref 78.0–100.0)
Monocytes Absolute: 0.7 10*3/uL (ref 0.1–1.0)
Monocytes Relative: 14 %
NEUTROS ABS: 3.5 10*3/uL (ref 1.7–7.7)
NEUTROS PCT: 66 %
Platelets: 186 10*3/uL (ref 150–400)
RBC: 4 MIL/uL — AB (ref 4.22–5.81)
RDW: 14.3 % (ref 11.5–15.5)
WBC: 5.2 10*3/uL (ref 4.0–10.5)

## 2015-05-09 LAB — URINALYSIS, ROUTINE W REFLEX MICROSCOPIC
Bilirubin Urine: NEGATIVE
Glucose, UA: NEGATIVE mg/dL
Hgb urine dipstick: NEGATIVE
KETONES UR: NEGATIVE mg/dL
LEUKOCYTES UA: NEGATIVE
NITRITE: NEGATIVE
PH: 6.5 (ref 5.0–8.0)
Protein, ur: NEGATIVE mg/dL
Specific Gravity, Urine: 1.019 (ref 1.005–1.030)

## 2015-05-09 LAB — SURGICAL PCR SCREEN
MRSA, PCR: NEGATIVE
Staphylococcus aureus: NEGATIVE

## 2015-05-09 LAB — PROTIME-INR
INR: 1.19 (ref 0.00–1.49)
PROTHROMBIN TIME: 15.3 s — AB (ref 11.6–15.2)

## 2015-05-09 LAB — APTT: aPTT: 30 seconds (ref 24–37)

## 2015-05-09 NOTE — Pre-Procedure Instructions (Signed)
    Thomas [name]  05/09/2015      [facility] [address], [place] [address] Phone: [phone] Fax: [phone]    Your procedure is scheduled on Monday, Jan.30  Report to [facility] Admitting at 11:40 A.M.  Call this number if you have problems the morning of surgery:  [phone]   Remember:  Do not eat food or drink liquids after midnight on Sunday, Jan 29  Take these medicines the morning of surgery with A SIP OF WATER : carvedilol (coreg)             Do not take  NSAID: aleve, ibuprofen, motrin, BC'S, goody's, fish oil, herbal medicines            Follow up with Dr. Mayer Camel concerning the aspirin (when to stop it)   Do not wear jewelry, make-up or nail polish.  Do not wear lotions, powders, or perfumes.  You may not  wear deodorant.  Do not shave 48 hours prior to surgery.  Men may shave face and neck.  Do not bring valuables to the hospital.  [facility] is not responsible for any belongings or valuables.  Contacts, dentures or bridgework may not be worn into surgery.  Leave your suitcase in the car.  After surgery it may be brought to your room.  For patients admitted to the hospital, discharge time will be determined by your treatment team.  Patients discharged the day of surgery will not be allowed to drive home.   Special instructions:  Review handouts  Please read over the following fact sheets that you were given. Pain Booklet, Coughing and Deep Breathing, Total Joint Packet, MRSA Information and Surgical Site Infection Prevention

## 2015-05-09 NOTE — Progress Notes (Addendum)
PCP: Dr. Pricilla Holm Cardiologist: Dr. Fransico Him  No one has instructed pt. When to stop aspirin. I called Dr. Damita Dunnings office left a message for Sandi Raveling to instruct pt. Also instructed pt. If he does not here from them to call the office for instructions.

## 2015-05-10 ENCOUNTER — Encounter (HOSPITAL_COMMUNITY): Payer: Self-pay

## 2015-05-10 NOTE — Progress Notes (Signed)
Anesthesia Chart Review: Patient is a 65 year old male scheduled for right THA on 05/20/15 by Dr. Mayer Camel. Case is posted for spinal anesthesia.  History includes non-smoker, HTN, ETOH abuse (documented as sober X 6 months, but he had an admission 02/25/15 for right knee pain with fall, +ETOH, off medications and in unstable afib with RVR with was treated with diltiazem), alcoholic fatty liver, portal hypertensive gastropathy, H. Pylori gastritis, chronic afib (not felt to be a candidate for anticoagulation due to ETOH/thrombocytopenia), dilated CM (presumed secondary to ETOH; non-ischemic stress '15), chronic systolic CHF. Was abstinent from ETOH since 03/2015. BMI is consistent with obesity.   PCP: Dr. Pricilla Holm with [facility], last visit 04/05/15. She was aware that he needed upcoming hip surgery.  Cardiologist: Dr. Fransico Him, last visit with [name], PA-C on 03/26/15. Under the Media tab, Dr. Radford Pax wrote a note stating that patient was "moderate risk for cardiac complications during R TKA [sic] due to reduced EF. Need to be cautious with IVF in intraop and periop period. Continue cardiac meds."   Meds include ASA 81 mg, Coreg, Folvite, Lasix, losartan, KCl, thiamine.  03/26/15 EKG: Afib at 90 bpm, moderate voltage criteria for LVH, may be normal variant, inferior infarct (age undetermined). HR was 96 at PAT.  02/27/15 Echo: Study Conclusions - Left ventricle: The cavity size was normal. Wall thickness was normal. Systolic function was mildly to moderately reduced. The estimated ejection fraction was in the range of 40% to 45%. Diffuse hypokinesis. - Mitral valve: There was moderate regurgitation. - Left atrium: The atrium was mildly dilated. - Right atrium: The atrium was mildly dilated. - Pulmonary arteries: PA peak pressure: 35 mm Hg (S).  08/23/13 Nuclear stress test: IMPRESSION: 1. No evidence for ischemia or infarction. 2. EF 36% with diffuse hypokinesis,  suspect nonischemic cardiomyopathy.  05/09/15 CXR: IMPRESSION: Low lung volumes. Otherwise negative chest.  Preoperative labs noted. CMET normal other than low ALT of 16. CBC showed normal PLT count with H/H 12.6/38.3. PT/INR 15.3/1.19, PTT 95. UA WNL.   He has chronic afib and systolic CHF. He was felt stable following his recent cardiology evaluation.Reportedly he is now off ETOH which can reassessed on the day of surgery to help with peri-operative decision making. If no acute changes then I would anticipate that he could proceed as planned.  [facility]/Anesthesiology Phone [phone] 05/10/2015 4:10 PM

## 2015-05-13 ENCOUNTER — Telehealth: Payer: Self-pay | Admitting: *Deleted

## 2015-05-13 MED ORDER — POTASSIUM CHLORIDE CRYS ER 20 MEQ PO TBCR: 40.0000 meq | EXTENDED_RELEASE_TABLET | Freq: Every day | ORAL | Status: DC

## 2015-05-13 NOTE — Telephone Encounter (Signed)
Receive call pt is needing a refill on his Klor-con. Inform opt on 04/28/15 md [facility]. He stated that they only gave him his Losartan. Inform pt will resend Klor con to [facility]...Lind Guest

## 2015-05-15 NOTE — H&P (Signed)
TOTAL HIP ADMISSION H&P  Patient is admitted for right total hip arthroplasty.  Subjective:  Chief Complaint: right hip pain  HPI: Thomas [name], 65 y.o. male, has a history of pain and functional disability in the right hip(s) due to arthritis and patient has failed non-surgical conservative treatments for greater than 12 weeks to include NSAID's and/or analgesics, flexibility and strengthening excercises, use of assistive devices, weight reduction as appropriate and activity modification.  Onset of symptoms was gradual starting several years ago with gradually worsening course since that time.The patient noted no past surgery on the right hip(s).  Patient currently rates pain in the right hip at 10 out of 10 with activity. Patient has night pain, worsening of pain with activity and weight bearing, trendelenberg gait, pain that interfers with activities of daily living and pain with passive range of motion. Patient has evidence of subchondral sclerosis, periarticular osteophytes, joint subluxation and joint space narrowing by imaging studies. This condition presents safety issues increasing the risk of falls.   There is no current active infection.  Patient Active Problem List   Diagnosis Date Noted  . Non compliance w medication regimen 02/25/2015  . Right hip pain 02/25/2015  . Lead toxicity 10/19/2013  . Hypertension   . Personal history of colonic polyps - adenomas 08/21/2013  . Portal hypertensive gastropathy 08/21/2013  . DCM (dilated cardiomyopathy) ([place]) 08/19/2013  . Chronic systolic CHF (congestive heart failure), NYHA class 2 ([place]) 08/19/2013  . Alcoholic liver disease ([place]) 08/18/2013  . Chronic atrial fibrillation ([place]) 08/17/2013  . Pancytopenia ([place]) 08/17/2013  . ETOH abuse 08/17/2013   Past Medical History  Diagnosis Date  . Obesity   . Arthritis   . Hypertension   . Chronic systolic CHF (congestive heart failure), NYHA class 2 ([place])   . DCM (dilated cardiomyopathy)  ([place])     EF 30% by echo 07/2013 with moderate RV dysfunction and moderate MR  . Pancytopenia ([place])   . ETOH abuse   . Alcoholic liver disease ([place])   . Gastroduodenitis     H Pylori positive  . Chronic atrial fibrillation ([name])     no an anticoagulation canditate due to alcohol abuse, liver cirrhosis with pancytopenia, medical noncompliance  . Diverticulosis of colon   . Portal hypertensive gastropathy   . Personal history of colonic polyps - adenomas 08/21/2013  . Helicobacter pylori gastritis 10/19/2013    Past Surgical History  Procedure Laterality Date  . Esophagogastroduodenoscopy N/A 08/21/2013    Procedure: ESOPHAGOGASTRODUODENOSCOPY (EGD);  Surgeon: Jerene Bears, MD;  Location: [facility] ENDOSCOPY;  Service: Endoscopy;  Laterality: N/A;  . Colonoscopy N/A 08/21/2013    Procedure: COLONOSCOPY;  Surgeon: Jerene Bears, MD;  Location: [facility] ENDOSCOPY;  Service: Endoscopy;  Laterality: N/A;    No prescriptions prior to admission   No Known Allergies  Social History  Substance Use Topics  . Smoking status: Never Smoker   . Smokeless tobacco: Never Used  . Alcohol Use: 4.8 oz/week    8 Cans of beer per week     Comment: last usage 6 months ago    Family History  Problem Relation Age of Onset  . Hypertension Father      Review of Systems  Constitutional: Positive for weight loss and diaphoresis.  HENT: Negative.   Eyes: Negative.   Respiratory: Negative.   Cardiovascular:       Htn  Gastrointestinal: Positive for diarrhea and constipation.  Genitourinary: Negative.   Musculoskeletal: Positive for joint pain.  Skin: Negative.   Neurological: Negative.   Endo/Heme/Allergies: Negative.   Psychiatric/Behavioral: Negative.     Objective:  Physical Exam  Constitutional: He is oriented to person, place, and time. He appears well-developed and well-nourished.  HENT:  Head: Normocephalic and atraumatic.  Eyes: Pupils are equal, round, and reactive to light.  Neck: Normal range of  motion. Neck supple.  Cardiovascular: Intact distal pulses.   Respiratory: Effort normal.  Musculoskeletal: He exhibits tenderness.  the patient's right hip has minimal pain with hip flexion extension internal/external rotation or log roll.  Patient's right hip does have significant pain with any range of motion.  He has virtually no internal rotation or external rotation.  He has tenderness with palpation of the groin.  Patient's calves are soft and nontender.  He does have edema bilaterally in his lower legs.  Neurological: He is alert and oriented to person, place, and time.  Skin: Skin is warm and dry.  Psychiatric: He has a normal mood and affect. His behavior is normal. Judgment and thought content normal.    Vital signs in last 24 hours:    Labs:   Estimated body mass index is 36.75 kg/(m^2) as calculated from the following:   Height as of 04/04/15: 6' (1.829 m).   Weight as of 04/04/15: 122.925 kg (271 lb).   Imaging Review Plain radiographs demonstrate severe end-stage arthritis of the right hip.  He does have collapse of the small head and lateral subluxation and numerous periarticular osteophytes.  Assessment/Plan:  End stage arthritis, right hip(s)  The patient history, physical examination, clinical judgement of the provider and imaging studies are consistent with end stage degenerative joint disease of the right hip(s) and total hip arthroplasty is deemed medically necessary. The treatment options including medical management, injection therapy, arthroscopy and arthroplasty were discussed at length. The risks and benefits of total hip arthroplasty were presented and reviewed. The risks due to aseptic loosening, infection, stiffness, dislocation/subluxation,  thromboembolic complications and other imponderables were discussed.  The patient acknowledged the explanation, agreed to proceed with the plan and consent was signed. Patient is being admitted for inpatient treatment  for surgery, pain control, PT, OT, prophylactic antibiotics, VTE prophylaxis, progressive ambulation and ADL's and discharge planning.The patient is planning to be discharged home with home health services

## 2015-05-17 MED ORDER — DEXTROSE 5 % IV SOLN
3.0000 g | INTRAVENOUS | Status: AC
  Administered 2015-05-20: 3 g via INTRAVENOUS
  Filled 2015-05-17: qty 3000

## 2015-05-17 MED ORDER — DEXTROSE-NACL 5-0.45 % IV SOLN: INTRAVENOUS | Status: DC

## 2015-05-17 MED ORDER — CHLORHEXIDINE GLUCONATE 4 % EX LIQD: 60.0000 mL | Freq: Once | CUTANEOUS | Status: DC

## 2015-05-18 DIAGNOSIS — M1611 Unilateral primary osteoarthritis, right hip: Secondary | ICD-10-CM | POA: Diagnosis present

## 2015-05-19 MED ORDER — TRANEXAMIC ACID 1000 MG/10ML IV SOLN
1000.0000 mg | INTRAVENOUS | Status: AC
  Administered 2015-05-20: 1000 mg via INTRAVENOUS
  Filled 2015-05-19 (×2): qty 10

## 2015-05-20 ENCOUNTER — Inpatient Hospital Stay (HOSPITAL_COMMUNITY): Payer: Commercial Managed Care - HMO | Admitting: Emergency Medicine

## 2015-05-20 ENCOUNTER — Inpatient Hospital Stay (HOSPITAL_COMMUNITY): Payer: Commercial Managed Care - HMO | Admitting: Anesthesiology

## 2015-05-20 ENCOUNTER — Inpatient Hospital Stay (HOSPITAL_COMMUNITY)
Admission: RE | Admit: 2015-05-20 | Discharge: 2015-05-22 | DRG: 470 | Disposition: A | Discharge status: Skilled Nursing Facility | Payer: Commercial Managed Care - HMO | Source: Ambulatory Visit | Attending: Orthopedic Surgery | Admitting: Orthopedic Surgery

## 2015-05-20 ENCOUNTER — Encounter (HOSPITAL_COMMUNITY)
Admission: RE | Disposition: A | Discharge status: Skilled Nursing Facility | Payer: Self-pay | Source: Ambulatory Visit | Attending: Orthopedic Surgery

## 2015-05-20 ENCOUNTER — Inpatient Hospital Stay (HOSPITAL_COMMUNITY): Payer: Commercial Managed Care - HMO

## 2015-05-20 ENCOUNTER — Encounter (HOSPITAL_COMMUNITY): Payer: Self-pay | Admitting: Surgery

## 2015-05-20 DIAGNOSIS — I10 Essential (primary) hypertension: Secondary | ICD-10-CM | POA: Diagnosis present

## 2015-05-20 DIAGNOSIS — D62 Acute posthemorrhagic anemia: Secondary | ICD-10-CM | POA: Diagnosis not present

## 2015-05-20 DIAGNOSIS — I5022 Chronic systolic (congestive) heart failure: Secondary | ICD-10-CM | POA: Diagnosis present

## 2015-05-20 DIAGNOSIS — I420 Dilated cardiomyopathy: Secondary | ICD-10-CM | POA: Diagnosis present

## 2015-05-20 DIAGNOSIS — M1611 Unilateral primary osteoarthritis, right hip: Principal | ICD-10-CM | POA: Diagnosis present

## 2015-05-20 DIAGNOSIS — K766 Portal hypertension: Secondary | ICD-10-CM | POA: Diagnosis present

## 2015-05-20 DIAGNOSIS — K709 Alcoholic liver disease, unspecified: Secondary | ICD-10-CM | POA: Diagnosis present

## 2015-05-20 DIAGNOSIS — M25551 Pain in right hip: Secondary | ICD-10-CM | POA: Diagnosis present

## 2015-05-20 DIAGNOSIS — Z6836 Body mass index (BMI) 36.0-36.9, adult: Secondary | ICD-10-CM

## 2015-05-20 DIAGNOSIS — D480 Neoplasm of uncertain behavior of bone and articular cartilage: Secondary | ICD-10-CM | POA: Diagnosis present

## 2015-05-20 DIAGNOSIS — Z96649 Presence of unspecified artificial hip joint: Secondary | ICD-10-CM

## 2015-05-20 DIAGNOSIS — I482 Chronic atrial fibrillation: Secondary | ICD-10-CM | POA: Diagnosis present

## 2015-05-20 HISTORY — PX: TOTAL HIP ARTHROPLASTY: SHX124

## 2015-05-20 LAB — TYPE AND SCREEN
ABO/RH(D): A POS
Antibody Screen: NEGATIVE

## 2015-05-20 SURGERY — ARTHROPLASTY, HIP, TOTAL,POSTERIOR APPROACH
Anesthesia: Spinal | Laterality: Right

## 2015-05-20 MED ORDER — OXYCODONE HCL 5 MG PO TABS
5.0000 mg | ORAL_TABLET | ORAL | Status: DC | PRN
  Administered 2015-05-20 – 2015-05-21 (×2): 10 mg via ORAL
  Filled 2015-05-20 (×3): qty 2

## 2015-05-20 MED ORDER — PROPOFOL 10 MG/ML IV BOLUS
INTRAVENOUS | Status: DC | PRN
  Administered 2015-05-20: 20 mg via INTRAVENOUS

## 2015-05-20 MED ORDER — SENNOSIDES-DOCUSATE SODIUM 8.6-50 MG PO TABS: 1.0000 | ORAL_TABLET | Freq: Every evening | ORAL | Status: DC | PRN

## 2015-05-20 MED ORDER — METHOCARBAMOL 500 MG PO TABS: 500.0000 mg | ORAL_TABLET | Freq: Two times a day (BID) | ORAL | Status: AC

## 2015-05-20 MED ORDER — METOCLOPRAMIDE HCL 5 MG PO TABS: 5.0000 mg | ORAL_TABLET | Freq: Three times a day (TID) | ORAL | Status: DC | PRN

## 2015-05-20 MED ORDER — PROPOFOL 500 MG/50ML IV EMUL
INTRAVENOUS | Status: DC | PRN
  Administered 2015-05-20: 75 ug/kg/min via INTRAVENOUS

## 2015-05-20 MED ORDER — KCL IN DEXTROSE-NACL 20-5-0.45 MEQ/L-%-% IV SOLN
INTRAVENOUS | Status: DC
  Administered 2015-05-20: 125 mL/h via INTRAVENOUS
  Administered 2015-05-22: 03:00:00 via INTRAVENOUS
  Filled 2015-05-20 (×3): qty 1000

## 2015-05-20 MED ORDER — PROPOFOL 10 MG/ML IV BOLUS
INTRAVENOUS | Status: AC
  Filled 2015-05-20: qty 20

## 2015-05-20 MED ORDER — METHOCARBAMOL 1000 MG/10ML IJ SOLN
500.0000 mg | Freq: Four times a day (QID) | INTRAMUSCULAR | Status: DC | PRN
  Filled 2015-05-20: qty 5

## 2015-05-20 MED ORDER — VITAMIN B-1 100 MG PO TABS
100.0000 mg | ORAL_TABLET | Freq: Every day | ORAL | Status: DC
  Administered 2015-05-21 – 2015-05-22 (×2): 100 mg via ORAL
  Filled 2015-05-20 (×2): qty 1

## 2015-05-20 MED ORDER — FUROSEMIDE 40 MG PO TABS
40.0000 mg | ORAL_TABLET | Freq: Every day | ORAL | Status: DC
  Administered 2015-05-21 – 2015-05-22 (×2): 40 mg via ORAL
  Filled 2015-05-20 (×2): qty 1

## 2015-05-20 MED ORDER — ALBUMIN HUMAN 5 % IV SOLN
INTRAVENOUS | Status: DC | PRN
  Administered 2015-05-20: 14:00:00 via INTRAVENOUS

## 2015-05-20 MED ORDER — CARVEDILOL 12.5 MG PO TABS
12.5000 mg | ORAL_TABLET | Freq: Two times a day (BID) | ORAL | Status: DC
  Administered 2015-05-21 – 2015-05-22 (×3): 12.5 mg via ORAL
  Filled 2015-05-20 (×3): qty 1

## 2015-05-20 MED ORDER — GLYCOPYRROLATE 0.2 MG/ML IJ SOLN
INTRAMUSCULAR | Status: DC | PRN
  Administered 2015-05-20 (×2): 0.2 mg via INTRAVENOUS

## 2015-05-20 MED ORDER — GLYCOPYRROLATE 0.2 MG/ML IJ SOLN
INTRAMUSCULAR | Status: AC
  Filled 2015-05-20: qty 1

## 2015-05-20 MED ORDER — DIPHENHYDRAMINE HCL 12.5 MG/5ML PO ELIX
12.5000 mg | ORAL_SOLUTION | ORAL | Status: DC | PRN
  Administered 2015-05-21: 25 mg via ORAL
  Filled 2015-05-20: qty 10

## 2015-05-20 MED ORDER — BISACODYL 5 MG PO TBEC: 5.0000 mg | DELAYED_RELEASE_TABLET | Freq: Every day | ORAL | Status: DC | PRN

## 2015-05-20 MED ORDER — ONDANSETRON HCL 4 MG/2ML IJ SOLN
INTRAMUSCULAR | Status: DC | PRN
  Administered 2015-05-20: 4 mg via INTRAVENOUS

## 2015-05-20 MED ORDER — PHENOL 1.4 % MT LIQD: 1.0000 | OROMUCOSAL | Status: DC | PRN

## 2015-05-20 MED ORDER — BUPIVACAINE-EPINEPHRINE (PF) 0.5% -1:200000 IJ SOLN
INTRAMUSCULAR | Status: AC
  Filled 2015-05-20: qty 30

## 2015-05-20 MED ORDER — POTASSIUM CHLORIDE CRYS ER 20 MEQ PO TBCR
40.0000 meq | EXTENDED_RELEASE_TABLET | Freq: Every day | ORAL | Status: DC
  Administered 2015-05-21 – 2015-05-22 (×2): 40 meq via ORAL
  Filled 2015-05-20 (×2): qty 2

## 2015-05-20 MED ORDER — FENTANYL CITRATE (PF) 100 MCG/2ML IJ SOLN
INTRAMUSCULAR | Status: DC | PRN
  Administered 2015-05-20 (×2): 50 ug via INTRAVENOUS

## 2015-05-20 MED ORDER — LACTATED RINGERS IV SOLN
INTRAVENOUS | Status: DC
  Administered 2015-05-20: 11:00:00 via INTRAVENOUS

## 2015-05-20 MED ORDER — MENTHOL 3 MG MT LOZG: 1.0000 | LOZENGE | OROMUCOSAL | Status: DC | PRN

## 2015-05-20 MED ORDER — MIDAZOLAM HCL 5 MG/5ML IJ SOLN
INTRAMUSCULAR | Status: DC | PRN
  Administered 2015-05-20: 2 mg via INTRAVENOUS

## 2015-05-20 MED ORDER — ONDANSETRON HCL 4 MG PO TABS: 4.0000 mg | ORAL_TABLET | Freq: Four times a day (QID) | ORAL | Status: DC | PRN

## 2015-05-20 MED ORDER — CEFUROXIME SODIUM 1.5 G IJ SOLR
INTRAMUSCULAR | Status: AC
  Filled 2015-05-20: qty 1.5

## 2015-05-20 MED ORDER — BUPIVACAINE-EPINEPHRINE 0.5% -1:200000 IJ SOLN
INTRAMUSCULAR | Status: DC | PRN
  Administered 2015-05-20: 30 mL

## 2015-05-20 MED ORDER — FLEET ENEMA 7-19 GM/118ML RE ENEM: 1.0000 | ENEMA | Freq: Once | RECTAL | Status: DC | PRN

## 2015-05-20 MED ORDER — TRANEXAMIC ACID 650 MG PO TABS
1300.0000 mg | ORAL_TABLET | Freq: Once | ORAL | Status: DC
  Filled 2015-05-20: qty 2

## 2015-05-20 MED ORDER — BUPIVACAINE HCL (PF) 0.5 % IJ SOLN
INTRAMUSCULAR | Status: DC | PRN
  Administered 2015-05-20: 3 mL via INTRATHECAL

## 2015-05-20 MED ORDER — METHOCARBAMOL 500 MG PO TABS
500.0000 mg | ORAL_TABLET | Freq: Four times a day (QID) | ORAL | Status: DC | PRN
  Administered 2015-05-20 – 2015-05-22 (×3): 500 mg via ORAL
  Filled 2015-05-20 (×3): qty 1

## 2015-05-20 MED ORDER — TAMSULOSIN HCL 0.4 MG PO CAPS
0.4000 mg | ORAL_CAPSULE | Freq: Every day | ORAL | Status: DC
  Administered 2015-05-21: 0.4 mg via ORAL
  Filled 2015-05-20: qty 1

## 2015-05-20 MED ORDER — ASPIRIN EC 325 MG PO TBEC: 325.0000 mg | DELAYED_RELEASE_TABLET | Freq: Two times a day (BID) | ORAL | Status: DC

## 2015-05-20 MED ORDER — FENTANYL CITRATE (PF) 250 MCG/5ML IJ SOLN
INTRAMUSCULAR | Status: AC
  Filled 2015-05-20: qty 5

## 2015-05-20 MED ORDER — TRANEXAMIC ACID 1000 MG/10ML IV SOLN
2000.0000 mg | INTRAVENOUS | Status: AC
  Administered 2015-05-20: 2000 mg via TOPICAL
  Filled 2015-05-20: qty 20

## 2015-05-20 MED ORDER — OXYCODONE-ACETAMINOPHEN 5-325 MG PO TABS: 1.0000 | ORAL_TABLET | ORAL | Status: DC | PRN

## 2015-05-20 MED ORDER — MIDAZOLAM HCL 2 MG/2ML IJ SOLN
INTRAMUSCULAR | Status: AC
  Filled 2015-05-20: qty 2

## 2015-05-20 MED ORDER — SODIUM CHLORIDE 0.9 % IV SOLN
10.0000 mg | INTRAVENOUS | Status: DC | PRN
  Administered 2015-05-20: 50 ug/min via INTRAVENOUS

## 2015-05-20 MED ORDER — ASPIRIN EC 325 MG PO TBEC
325.0000 mg | DELAYED_RELEASE_TABLET | Freq: Every day | ORAL | Status: DC
  Administered 2015-05-21 – 2015-05-22 (×2): 325 mg via ORAL
  Filled 2015-05-20 (×2): qty 1

## 2015-05-20 MED ORDER — FOLIC ACID 1 MG PO TABS
1.0000 mg | ORAL_TABLET | Freq: Every day | ORAL | Status: DC
  Administered 2015-05-21 – 2015-05-22 (×2): 1 mg via ORAL
  Filled 2015-05-20 (×2): qty 1

## 2015-05-20 MED ORDER — ACETAMINOPHEN 325 MG PO TABS
650.0000 mg | ORAL_TABLET | Freq: Four times a day (QID) | ORAL | Status: DC | PRN
  Administered 2015-05-20: 650 mg via ORAL
  Filled 2015-05-20: qty 2

## 2015-05-20 MED ORDER — DEXAMETHASONE SODIUM PHOSPHATE 10 MG/ML IJ SOLN
10.0000 mg | Freq: Once | INTRAMUSCULAR | Status: AC
  Administered 2015-05-21: 10 mg via INTRAVENOUS
  Filled 2015-05-20: qty 1

## 2015-05-20 MED ORDER — ONDANSETRON HCL 4 MG/2ML IJ SOLN: 4.0000 mg | Freq: Four times a day (QID) | INTRAMUSCULAR | Status: DC | PRN

## 2015-05-20 MED ORDER — SODIUM CHLORIDE 0.9 % IR SOLN
Status: DC | PRN
  Administered 2015-05-20: 1000 mL

## 2015-05-20 MED ORDER — HYDROMORPHONE HCL 1 MG/ML IJ SOLN
1.0000 mg | INTRAMUSCULAR | Status: DC | PRN
  Administered 2015-05-20: 1 mg via INTRAVENOUS
  Filled 2015-05-20 (×2): qty 1

## 2015-05-20 MED ORDER — LOSARTAN POTASSIUM 25 MG PO TABS
12.5000 mg | ORAL_TABLET | Freq: Every day | ORAL | Status: DC
  Administered 2015-05-21: 12.5 mg via ORAL
  Filled 2015-05-20 (×2): qty 1

## 2015-05-20 MED ORDER — DOCUSATE SODIUM 100 MG PO CAPS
100.0000 mg | ORAL_CAPSULE | Freq: Two times a day (BID) | ORAL | Status: DC
  Administered 2015-05-20 – 2015-05-22 (×4): 100 mg via ORAL
  Filled 2015-05-20 (×4): qty 1

## 2015-05-20 MED ORDER — PHENYLEPHRINE HCL 10 MG/ML IJ SOLN
INTRAMUSCULAR | Status: AC
  Filled 2015-05-20: qty 1

## 2015-05-20 MED ORDER — METOCLOPRAMIDE HCL 5 MG/ML IJ SOLN: 5.0000 mg | Freq: Three times a day (TID) | INTRAMUSCULAR | Status: DC | PRN

## 2015-05-20 MED ORDER — ACETAMINOPHEN 650 MG RE SUPP: 650.0000 mg | Freq: Four times a day (QID) | RECTAL | Status: DC | PRN

## 2015-05-20 MED ORDER — ALUM & MAG HYDROXIDE-SIMETH 200-200-20 MG/5ML PO SUSP: 30.0000 mL | ORAL | Status: DC | PRN

## 2015-05-20 SURGICAL SUPPLY — 57 items
BLADE SAW SGTL 18X1.27X75 (BLADE) ×2 IMPLANT
BRUSH FEMORAL CANAL (MISCELLANEOUS) IMPLANT
CAPT HIP TOTAL 2 ×2 IMPLANT
COVER BACK TABLE 24X17X13 BIG (DRAPES) IMPLANT
COVER SURGICAL LIGHT HANDLE (MISCELLANEOUS) ×4 IMPLANT
DRAPE IMP U-DRAPE 54X76 (DRAPES) ×2 IMPLANT
DRAPE ORTHO SPLIT 77X108 STRL (DRAPES) ×1
DRAPE PROXIMA HALF (DRAPES) ×2 IMPLANT
DRAPE SURG ORHT 6 SPLT 77X108 (DRAPES) ×1 IMPLANT
DRAPE U-SHAPE 47X51 STRL (DRAPES) ×2 IMPLANT
DRILL BIT 7/64X5 (BIT) ×2 IMPLANT
DRSG AQUACEL AG ADV 3.5X10 (GAUZE/BANDAGES/DRESSINGS) ×2 IMPLANT
DRSG AQUACEL AG ADV 3.5X14 (GAUZE/BANDAGES/DRESSINGS) ×2 IMPLANT
DURAPREP 26ML APPLICATOR (WOUND CARE) ×2 IMPLANT
ELECT BLADE 4.0 EZ CLEAN MEGAD (MISCELLANEOUS)
ELECT REM PT RETURN 9FT ADLT (ELECTROSURGICAL) ×2
ELECTRODE BLDE 4.0 EZ CLN MEGD (MISCELLANEOUS) IMPLANT
ELECTRODE REM PT RTRN 9FT ADLT (ELECTROSURGICAL) ×1 IMPLANT
GLOVE BIO SURGEON STRL SZ7.5 (GLOVE) ×2 IMPLANT
GLOVE BIO SURGEON STRL SZ8.5 (GLOVE) ×4 IMPLANT
GLOVE BIOGEL PI IND STRL 8 (GLOVE) ×2 IMPLANT
GLOVE BIOGEL PI IND STRL 9 (GLOVE) ×1 IMPLANT
GLOVE BIOGEL PI INDICATOR 8 (GLOVE) ×2
GLOVE BIOGEL PI INDICATOR 9 (GLOVE) ×1
GOWN STRL REUS W/ TWL LRG LVL3 (GOWN DISPOSABLE) ×2 IMPLANT
GOWN STRL REUS W/ TWL XL LVL3 (GOWN DISPOSABLE) ×2 IMPLANT
GOWN STRL REUS W/TWL LRG LVL3 (GOWN DISPOSABLE) ×2
GOWN STRL REUS W/TWL XL LVL3 (GOWN DISPOSABLE) ×2
HANDPIECE INTERPULSE COAX TIP (DISPOSABLE)
HOOD PEEL AWAY FACE SHEILD DIS (HOOD) ×4 IMPLANT
KIT BASIN OR (CUSTOM PROCEDURE TRAY) ×2 IMPLANT
KIT ROOM TURNOVER OR (KITS) ×2 IMPLANT
MANIFOLD NEPTUNE II (INSTRUMENTS) ×2 IMPLANT
NEEDLE 22X1 1/2 (OR ONLY) (NEEDLE) ×2 IMPLANT
NS IRRIG 1000ML POUR BTL (IV SOLUTION) ×2 IMPLANT
PACK TOTAL JOINT (CUSTOM PROCEDURE TRAY) ×2 IMPLANT
PACK UNIVERSAL I (CUSTOM PROCEDURE TRAY) ×2 IMPLANT
PAD ARMBOARD 7.5X6 YLW CONV (MISCELLANEOUS) ×4 IMPLANT
PASSER SUT SWANSON 36MM LOOP (INSTRUMENTS) ×2 IMPLANT
PRESSURIZER FEMORAL UNIV (MISCELLANEOUS) IMPLANT
SET HNDPC FAN SPRY TIP SCT (DISPOSABLE) IMPLANT
SPONGE LAP 18X18 X RAY DECT (DISPOSABLE) ×6 IMPLANT
SUT ETHIBOND 2 V 37 (SUTURE) ×2 IMPLANT
SUT VIC AB 0 CT1 27 (SUTURE) ×1
SUT VIC AB 0 CT1 27XBRD ANBCTR (SUTURE) ×1 IMPLANT
SUT VIC AB 0 CTB1 27 (SUTURE) ×2 IMPLANT
SUT VIC AB 1 CTX 36 (SUTURE) ×1
SUT VIC AB 1 CTX36XBRD ANBCTR (SUTURE) ×1 IMPLANT
SUT VIC AB 2-0 CTB1 (SUTURE) ×2 IMPLANT
SUT VIC AB 3-0 CT1 27 (SUTURE) ×1
SUT VIC AB 3-0 CT1 TAPERPNT 27 (SUTURE) ×1 IMPLANT
SYR CONTROL 10ML LL (SYRINGE) ×2 IMPLANT
TOWEL OR 17X24 6PK STRL BLUE (TOWEL DISPOSABLE) ×2 IMPLANT
TOWEL OR 17X26 10 PK STRL BLUE (TOWEL DISPOSABLE) ×2 IMPLANT
TOWER CARTRIDGE SMART MIX (DISPOSABLE) IMPLANT
TRAY FOLEY CATH 14FR (SET/KITS/TRAYS/PACK) IMPLANT
WATER STERILE IRR 1000ML POUR (IV SOLUTION) IMPLANT

## 2015-05-20 NOTE — Anesthesia Preprocedure Evaluation (Addendum)
Anesthesia Evaluation  Patient identified by MRN, date of birth, ID band Patient awake    Reviewed: Allergy & Precautions, H&P , NPO status , Patient's Chart, lab work & pertinent test results, reviewed documented beta blocker date and time   Airway Mallampati: II  TM Distance: >3 FB Neck ROM: full    Dental  (+) Teeth Intact, Dental Advidsory Given, Poor Dentition   Pulmonary neg pulmonary ROS, shortness of breath and with exertion,    Pulmonary exam normal        Cardiovascular hypertension, Pt. on medications and Pt. on home beta blockers +CHF   Rhythm:Irregular     Neuro/Psych negative neurological ROS  negative psych ROS   GI/Hepatic (+)     substance abuse  alcohol use,   Endo/Other  Morbid obesity  Renal/GU      Musculoskeletal  (+) Arthritis ,   Abdominal   Peds  Hematology  (+) anemia ,   Anesthesia Other Findings   Reproductive/Obstetrics                            Anesthesia Physical  Anesthesia Plan  ASA: III  Anesthesia Plan: Spinal   Post-op Pain Management:    Induction: Intravenous  Airway Management Planned:   Additional Equipment:   Intra-op Plan:   Post-operative Plan:   Informed Consent: I have reviewed the patients History and Physical, chart, labs and discussed the procedure including the risks, benefits and alternatives for the proposed anesthesia with the patient or authorized representative who has indicated his/her understanding and acceptance.   Dental advisory given  Plan Discussed with: CRNA  Anesthesia Plan Comments:         Anesthesia Quick Evaluation

## 2015-05-20 NOTE — Transfer of Care (Signed)
Immediate Anesthesia Transfer of Care Note  Patient: Thomas [name]  Procedure(s) Performed: Procedure(s): TOTAL HIP ARTHROPLASTY (Right)  Patient Location: PACU  Anesthesia Type:MAC and Spinal  Level of Consciousness: awake  Airway & Oxygen Therapy: Patient Spontanous Breathing and Patient connected to face mask oxygen  Post-op Assessment: Report given to RN and Post -op Vital signs reviewed and stable  Post vital signs: stable  Last Vitals:  Filed Vitals:   05/20/15 1105 05/20/15 1455  BP: 119/79 92/65  Pulse: 79 154  Temp: 36.9 C   Resp: 18 15    Complications: No apparent anesthesia complications

## 2015-05-20 NOTE — Anesthesia Postprocedure Evaluation (Signed)
Anesthesia Post Note  Patient: Thomas [name]  Procedure(s) Performed: Procedure(s) (LRB): TOTAL HIP ARTHROPLASTY (Right)  Patient location during evaluation: PACU Anesthesia Type: Spinal Level of consciousness: awake and alert Pain management: pain level controlled Vital Signs Assessment: post-procedure vital signs reviewed and stable Respiratory status: spontaneous breathing Cardiovascular status: stable Anesthetic complications: no    Last Vitals:  Filed Vitals:   05/20/15 1105 05/20/15 1455  BP: 119/79 92/65  Pulse: 79 154  Temp: 36.9 C   Resp: 18 15    Last Pain: There were no vitals filed for this visit.               Nolon Nations

## 2015-05-20 NOTE — Op Note (Signed)
PATIENT ID:      Thomas [name]  MRN:     [id number] DOB/AGE:    63/15/1952 / 63 y.o.       OPERATIVE REPORT    DATE OF PROCEDURE:  05/20/2015       PREOPERATIVE DIAGNOSIS:  OSTEOARTHRITIS RIGHT HIP                                                       Estimated body mass index is 36.07 kg/(m^2) as calculated from the following:   Height as of this encounter: 6' (1.829 m).   Weight as of this encounter: 120.657 kg (266 lb).     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 Barton Dubois  (present throughout entire procedure and necessary for timely completion of the procedure)  ANESTHESIA: Spinal  BLOOD LOSS: 600 FLUID REPLACEMENT: 2400 crystalloid Tranexamic Acid: 1gm iv, 2gm topical DRAINS: None COMPLICATIONS: None    INDICATIONS FOR PROCEDURE:Patient with end-stage arthritis of the R hip.  X-rays show bone-on-bone arthritic changes, peri chondral cyts. Despite conservative measures with observation, anti-inflammatory medicine, narcotics, use of a cane, has severe unremitting pain and can ambulate only 1/2 blocks before resting.  Patient desires elective right total hip arthroplasty to decrease pain and increase function. The risks, benefits, and alternatives were discussed at length including but not limited to the risks of infection, bleeding, nerve injury, stiffness, blood clots, the need for revision surgery, cardiopulmonary complications, among others, and they were willing to proceed.Benefits have been discussed. Questions answered.     PROCEDURE IN DETAIL: The patient was  identified by armband,  received preoperative IV antibiotics in the holding area at [facility], taken to the operating room , appropriate anesthetic monitors  were attached and general endotracheal anesthesia induced. Foley catheter was inserted. Patient was rolled into the L lateral decubitus position and fixed there with a Stulberg Mark II pelvic clamp and the R lower extremity was then prepped and draped  in the usual sterile fashion from the ankle to the hemipelvis. A time-out  procedure was performed. The skin along the lateral hip and thigh  infiltrated with 10 mL of 0.5% Marcaine and epinephrine solution. We  then made a posterolateral approach to the hip. With a #10 blade, 20 cm  incision through skin and subcutaneous tissue down to the level of the  IT band. Small bleeders were identified and cauterized. IT band cut in  line with skin incision exposing the greater trochanter. Just posterior the greater trochanter there was a large amount of fibrofatty tissue that was studded with nuggets of bone some up to 3 cm in diameter adherent to the synovium  and coming from the capsule of the hip. This large mass was removed with electrocautery including part of the inferior hip joint capsule and sent off to pathology in formalin and was most consistent with synovial chondromatosis. We encountered more these nuggets superiorly anteriorly and inferior and they were removed throughout the procedure. A Cobra retractor was placed between the gluteus minimus and the superior hip joint capsule, and a spiked Cobra between the quadratus femoris and the inferior hip joint capsule. This isolated the short  external rotators and piriformis tendons. These were tagged with a #2 Ethibond  suture and cut off their insertion on the intertrochanteric crest. The posterior  capsule was then developed into an acetabular-based flap from Posterior Superior off of the acetabulum out over the femoral neck and back  posterior inferior to the acetabular rim. This flap was tagged with two #2 Ethibond sutures and retracted protecting the sciatic nerve. This exposed the arthritic femoral head and osteophytes. The hip was then flexed and internally rotated, dislocating the femoral head and a standard neck cut performed 1 fingerbreadth above the lesser trochanter.  A spiked Cobra was placed in the cotyloid notch and a Hohmann retractor was then used to lever the femur anteriorly off of the anterior pelvic column. A posterior-inferior wing retractor was placed at the junction of the acetabulum and the ischium completing the acetabular exposure.We then removed the peripheral osteophytes and labrum from the acetabulum. We then reamed the acetabulum up to 60 mm with basket reamers obtaining good coverage in all quadrants, irrigated out with normal  saline solution and hammered into place a 60 mm pinnacle cup in 45  degrees of abduction and about 20 degrees of anteversion. More  peripheral osteophytes removed, the apex hole eliminator was placed, and a 10-degree liner placed with the  IndexPS. The hip was then flexed and internally rotated exposing the  proximal femur, which was entered with the box cutting chisel, the initiating reamer followed by axial reaming up to 19.65mm full depth, and 20 partial depth. We then conically reamed up to 72F. And milled the calcar to 72FXL. We then placed the trial sleeve, and a trial stem. A trial reduction was then  performed with a +0  36-mm ball on the standard neck and  excellent stability was noted with at 90 of flexion with 70 of  internal rotation and then full extension withexternal rotation. The hip  could not be dislocated in full extension. The knee could easily flex  to about 140 degrees. We also stretched the abductors at this point,  because of the preexisting adductor contractures. All trial components  were then removed. The real sleeve was then hammered into place, through  the sleeve we reamed with a 19.5 reamer to ensure that the stem did not become incarcerated. The stem itself was then inserted in 0deg in relation to the calcar.  At this point, a + 6 36-mm ceramic head was  hammered on the stem. The hip was reduced. We checked our stability  one more time and found to be excellent. The wound was once again  thoroughly irrigated out with normal saline solution pulse lavage. The  capsular flap and short external rotators were repaired back to the  intertrochanteric crest through drill holes with a #2 Ethibond suture.  The IT band was closed with running 1 Vicryl suture. The subcutaneous  tissue with 0 and 2-0 undyed Vicryl suture and the skin with running  3-0 Vivryl SQ suture. Aquacil dessing  was applied. The patient was then unclamped, rolled supine, awaken extubated and taken to recovery room without difficulty in stable condition.   Thomas [name] 05/20/2015, 2:41 PM

## 2015-05-20 NOTE — Discharge Instructions (Signed)

## 2015-05-20 NOTE — Anesthesia Procedure Notes (Addendum)
Spinal Patient location during procedure: OR Staffing Anesthesiologist: Nolon Nations Performed by: anesthesiologist  Preanesthetic Checklist Completed: patient identified, site marked, surgical consent, pre-op evaluation, timeout performed, IV checked, risks and benefits discussed and monitors and equipment checked Spinal Block Patient position: sitting Prep: ChloraPrep Patient monitoring: heart rate, continuous pulse ox and blood pressure Approach: right paramedian Location: L2-3 Injection technique: single-shot Needle Needle type: Sprotte  Needle gauge: 24 G Needle length: 9 cm Additional Notes Expiration date of kit checked and confirmed. Patient tolerated procedure well, without complications.    Procedure Name: MAC Date/Time: 05/20/2015 12:47 PM Performed by: Kyung Rudd Pre-anesthesia Checklist: Patient identified, Emergency Drugs available, Suction available, Patient being monitored and Timeout performed Patient Re-evaluated:Patient Re-evaluated prior to inductionOxygen Delivery Method: Simple face mask Intubation Type: IV induction Placement Confirmation: positive ETCO2

## 2015-05-21 ENCOUNTER — Encounter (HOSPITAL_COMMUNITY): Payer: Self-pay | Admitting: Orthopedic Surgery

## 2015-05-21 LAB — CBC
HEMATOCRIT: 23.8 % — AB (ref 39.0–52.0)
Hemoglobin: 8.2 g/dL — ABNORMAL LOW (ref 13.0–17.0)
MCH: 31.5 pg (ref 26.0–34.0)
MCHC: 34.5 g/dL (ref 30.0–36.0)
MCV: 91.5 fL (ref 78.0–100.0)
Platelets: 116 10*3/uL — ABNORMAL LOW (ref 150–400)
RBC: 2.6 MIL/uL — AB (ref 4.22–5.81)
RDW: 14.3 % (ref 11.5–15.5)
WBC: 7 10*3/uL (ref 4.0–10.5)

## 2015-05-21 NOTE — Progress Notes (Signed)
Utilization review completed.  

## 2015-05-21 NOTE — Progress Notes (Addendum)
Patient ID: Thomas [name], male   DOB: 1951/03/25, 65 y.o.   MRN: [id number] PATIENT ID: Thomas [name]  MRN: [id number]  DOB/AGE:  01-09-1951 / 65 y.o.  1 Day Post-Op Procedure(s) (LRB): TOTAL HIP ARTHROPLASTY (Right)    PROGRESS NOTE Subjective: Patient is alert, oriented, no Nausea, no Vomiting, yes passing gas, . Taking PO well. Denies SOB, Chest or Calf Pain. Using Incentive Spirometer, PAS in place. Ambulate WBAT Patient reports pain as  6/10  .    Objective: Vital signs in last 24 hours: Filed Vitals:   05/20/15 1645 05/20/15 2132 05/21/15 0027 05/21/15 0545  BP: 111/60 100/55 92/68 97/60   Pulse: 96 80 105 90  Temp: 97.6 F (36.4 C) 101.9 F (38.8 C) 98.8 F (37.1 C) 99.5 F (37.5 C)  TempSrc:  Oral Oral Oral  Resp: 16 18    Height:      Weight:      SpO2: 96% 100%  97%      Intake/Output from previous day: I/O last 3 completed shifts: In: 2200 [I.V.:1700; IV Piggyback:500] Out: 1000 [Urine:400; Blood:600]   Intake/Output this shift: Total I/O In: 968.8 [I.V.:968.8] Out: -    LABORATORY DATA:  Recent Labs  05/21/15 0635  WBC 7.0  HGB 8.2*  HCT 23.8*  PLT PENDING    Examination: Neurologically intact ABD soft Neurovascular intact Sensation intact distally Intact pulses distally Dorsiflexion/Plantar flexion intact Incision: dressing C/D/I No cellulitis present Compartment soft} XR AP&Lat of hip shows well placed\fixed THA  Assessment:   1 Day Post-Op Procedure(s) (LRB): TOTAL HIP ARTHROPLASTY (Right) ADDITIONAL DIAGNOSIS:  Expected Acute Blood Loss Anemia, Hypertension, afib, chronic CHF, hx transfusions  Plan: PT/OT WBAT, THA  DVT Prophylaxis: SCDx72 hrs, ASA 325 mg BID x 2 weeks  DISCHARGE PLAN: Home, may need SNF  DISCHARGE NEEDS: HHPT, Walker and 3-in-1 comode seat

## 2015-05-21 NOTE — Evaluation (Signed)
Physical Therapy Evaluation Patient Details Name: Thomas [name] MRN: [id number] DOB: Sep 13, 1950 Today's Date: 05/21/2015   History of Present Illness  Admitted for RTHA;  has a past medical history of Obesity; Arthritis; Hypertension; Chronic systolic CHF (congestive heart failure), NYHA class 2 ([name]); DCM (dilated cardiomyopathy) ([place]); Pancytopenia ([place]); ETOH abuse; Alcoholic liver disease ([place]); Gastroduodenitis; Chronic atrial fibrillation ([place]); Diverticulosis of colon; Portal hypertensive gastropathy; Personal history of colonic polyps - adenomas ([place]); and Helicobacter pylori gastritis (10/19/2013).   Clinical Impression   Pt is s/p THA resulting in the deficits listed below (see PT Problem List). Hypotensive on PT eval; very motivated to work, even despite pain with hip motion;  Pt will benefit from skilled PT to increase their independence and safety with mobility to allow discharge to the venue listed below.      Follow Up Recommendations SNF    Equipment Recommendations  Rolling walker with 5" wheels;3in1 (PT)    Recommendations for Other Services OT consult     Precautions / Restrictions Precautions Precautions: Posterior Hip;Fall Precaution Booklet Issued: Yes (comment) Precaution Comments: Able to recall 2/3 prec at end of session; very hypotensive on PT eval Restrictions Weight Bearing Restrictions: Yes RLE Weight Bearing: Weight bearing as tolerated      Mobility  Bed Mobility Overal bed mobility: Needs Assistance Bed Mobility: Supine to Sit     Supine to sit: Min assist     General bed mobility comments: Cues for technqiue; min assist to ensure post hip prec with moving  Transfers Overall transfer level: Needs assistance Equipment used: Rolling walker (2 wheeled) Transfers: Sit to/from Stand Sit to Stand: Mod assist         General transfer comment: Cues for precautions and hand placement; min assist to initiate boost up; pt essentially pulled  up on RW despite cues  Ambulation/Gait Ambulation/Gait assistance: Min guard Ambulation Distance (Feet): 25 Feet Assistive device: Rolling walker (2 wheeled) Gait Pattern/deviations: Step-through pattern;Trunk flexed     General Gait Details: Cues for gait sequence and technique; Noted some unsteadiness; sat down when pt reported lightheadedness; BP in recliner feet up 73/53, HR 84; RN aware  Stairs            Wheelchair Mobility    Modified Rankin (Stroke Patients Only)       Balance                                             Pertinent Vitals/Pain Pain Assessment: 0-10 Pain Score: 9  Pain Location: R hip with therex Pain Descriptors / Indicators: Aching Pain Intervention(s): Limited activity within patient's tolerance;Monitored during session    [place] expects to be discharged to:: Private residence Living Arrangements: Parent Available Help at Discharge: Family;Available 24 hours/day (cannot give physical assist) Type of Home: House Home Access: Stairs to enter Entrance Stairs-Rails: Right Entrance Stairs-Number of Steps: 2 Home Layout: One level Home Equipment: Walker - 2 wheels;Cane - single point      Prior Function           Comments: pt's mother does the grocery shopping, cooking and cleaning. pt uses a cane sometimes     Hand Dominance   Dominant Hand: Right    Extremity/Trunk Assessment   Upper Extremity Assessment: Overall WFL for tasks assessed           Lower Extremity Assessment: Generalized  weakness;RLE deficits/detail RLE Deficits / Details: Grossly decr AROM and strength, limited by pain postop       Communication   Communication: No difficulties  Cognition Arousal/Alertness: Awake/alert Behavior During Therapy: WFL for tasks assessed/performed Overall Cognitive Status: Within Functional Limits for tasks assessed                      General Comments      Exercises  Total Joint Exercises Quad Sets: AROM;Right;5 reps Gluteal Sets: AROM;Both;5 reps Heel Slides: AAROM;Right;5 reps Hip ABduction/ADduction: AAROM;Right;5 reps      Assessment/Plan    PT Assessment Patient needs continued PT services  PT Diagnosis Difficulty walking;Acute pain   PT Problem List Decreased strength;Decreased range of motion;Decreased activity tolerance;Decreased balance;Decreased mobility;Decreased knowledge of use of DME;Decreased knowledge of precautions;Pain;Cardiopulmonary status limiting activity  PT Treatment Interventions DME instruction;Gait training;Stair training;Functional mobility training;Therapeutic activities;Therapeutic exercise;Patient/family education   PT Goals (Current goals can be found in the Care Plan section) Acute Rehab PT Goals Patient Stated Goal: walk without pain PT Goal Formulation: With patient Time For Goal Achievement: 05/28/15 Potential to Achieve Goals: Good    Frequency 7X/week   Barriers to discharge        Co-evaluation               End of Session Equipment Utilized During Treatment: Gait belt Activity Tolerance: Patient tolerated treatment well Patient left: in chair;with call bell/phone within reach;with nursing/sitter in room Nurse Communication: Mobility status (Hypotension)         Time: [id number] PT Time Calculation (min) (ACUTE ONLY): 36 min   Charges:   PT Evaluation $PT Eval Moderate Complexity: 1 Procedure PT Treatments $Gait Training: 8-22 mins   PT G Codes:        Quin Hoop 05/21/2015, 12:36 PM  Roney Torryn, [place] Pager [phone] Office [phone]

## 2015-05-21 NOTE — NC FL2 (Signed)
[place] LEVEL OF CARE SCREENING TOOL     IDENTIFICATION  Patient Name: Thomas [name] Birthdate: 20-Jun-1950 Sex: male Admission Date (Current Location): 05/20/2015  [facility] and [place] Number:  Herbalist and Address:  [facility] [place], [place] [address], [place], [place] [address]      Provider Number: [id number]  Attending Physician Name and Address:  Frederik Pear, MD  Relative Name and Phone Number:       Current Level of Care: Hospital Recommended Level of Care: [place] Prior Approval Number:    Date Approved/Denied:   PASRR Number: [id number]  Discharge Plan: SNF    Current Diagnoses: Patient Active Problem List   Diagnosis Date Noted  . Primary osteoarthritis of right hip 05/18/2015  . Non compliance w medication regimen 02/25/2015  . Right hip pain 02/25/2015  . Lead toxicity 10/19/2013  . Hypertension   . Personal history of colonic polyps - adenomas 08/21/2013  . Portal hypertensive gastropathy 08/21/2013  . DCM (dilated cardiomyopathy) ([place]) 08/19/2013  . Chronic systolic CHF (congestive heart failure), NYHA class 2 ([place]) 08/19/2013  . Alcoholic liver disease ([place]) 08/18/2013  . Chronic atrial fibrillation ([place]) 08/17/2013  . Pancytopenia ([place]) 08/17/2013  . ETOH abuse 08/17/2013    Orientation RESPIRATION BLADDER Height & Weight     Self, Time, Situation, Place    Continent Weight: 266 lb (120.657 kg) Height:  6' (182.9 cm)  BEHAVIORAL SYMPTOMS/MOOD NEUROLOGICAL BOWEL NUTRITION STATUS      Continent    AMBULATORY STATUS COMMUNICATION OF NEEDS Skin   Extensive Assist Verbally Surgical wounds                       Personal Care Assistance Level of Assistance  Bathing Bathing Assistance: Limited assistance         Functional Limitations Info             SPECIAL CARE FACTORS FREQUENCY  PT (By licensed PT), OT (By licensed OT)                    Contractures  Contractures Info: Not present    Additional Factors Info  Code Status Code Status Info: FULL CODE          SS # [id number]   Current Medications (05/21/2015):  This is the current hospital active medication list Current Facility-Administered Medications  Medication Dose Route Frequency Provider Last Rate Last Dose  . acetaminophen (TYLENOL) tablet 650 mg  650 mg Oral Q6H PRN Leighton Parody, PA-C   650 mg at 05/20/15 2304   Or  . acetaminophen (TYLENOL) suppository 650 mg  650 mg Rectal Q6H PRN Leighton Parody, PA-C      . alum & mag hydroxide-simeth (MAALOX/MYLANTA) 200-200-20 MG/5ML suspension 30 mL  30 mL Oral Q4H PRN Leighton Parody, PA-C      . aspirin EC tablet 325 mg  325 mg Oral Q breakfast Leighton Parody, PA-C   325 mg at 05/21/15 0946  . bisacodyl (DULCOLAX) EC tablet 5 mg  5 mg Oral Daily PRN Leighton Parody, PA-C      . carvedilol (COREG) tablet 12.5 mg  12.5 mg Oral BID WC Leighton Parody, PA-C   12.5 mg at 05/21/15 0800  . dextrose 5 % and 0.45 % NaCl with KCl 20 mEq/L infusion   Intravenous Continuous Leighton Parody, PA-C 20 mL/hr at 05/21/15 [id number] 20 mL/hr at  05/21/15 [id number]  . diphenhydrAMINE (BENADRYL) 12.5 MG/5ML elixir 12.5-25 mg  12.5-25 mg Oral Q4H PRN Leighton Parody, PA-C   25 mg at 05/21/15 0034  . docusate sodium (COLACE) capsule 100 mg  100 mg Oral BID Leighton Parody, PA-C   100 mg at 05/21/15 1000  . folic acid (FOLVITE) tablet 1 mg  1 mg Oral Daily Leighton Parody, PA-C   1 mg at 05/21/15 0946  . furosemide (LASIX) tablet 40 mg  40 mg Oral Daily Leighton Parody, PA-C   40 mg at 05/21/15 0946  . HYDROmorphone (DILAUDID) injection 1 mg  1 mg Intravenous Q2H PRN Leighton Parody, PA-C   1 mg at 05/20/15 2102  . losartan (COZAAR) tablet 12.5 mg  12.5 mg Oral Daily Leighton Parody, PA-C   12.5 mg at 05/21/15 [id number]  . menthol-cetylpyridinium (CEPACOL) lozenge 3 mg  1 lozenge Oral PRN Leighton Parody, PA-C       Or  . phenol (CHLORASEPTIC) mouth spray 1 spray  1  spray Mouth/Throat PRN Leighton Parody, PA-C      . methocarbamol (ROBAXIN) tablet 500 mg  500 mg Oral Q6H PRN Leighton Parody, PA-C   500 mg at 05/21/15 [id number]   Or  . methocarbamol (ROBAXIN) 500 mg in dextrose 5 % 50 mL IVPB  500 mg Intravenous Q6H PRN Leighton Parody, PA-C      . metoCLOPramide (REGLAN) tablet 5-10 mg  5-10 mg Oral Q8H PRN Leighton Parody, PA-C       Or  . metoCLOPramide (REGLAN) injection 5-10 mg  5-10 mg Intravenous Q8H PRN Leighton Parody, PA-C      . ondansetron [facility]) tablet 4 mg  4 mg Oral Q6H PRN Leighton Parody, PA-C       Or  . ondansetron [facility]) injection 4 mg  4 mg Intravenous Q6H PRN Leighton Parody, PA-C      . oxyCODONE (Oxy IR/ROXICODONE) immediate release tablet 5-10 mg  5-10 mg Oral Q3H PRN Leighton Parody, PA-C   10 mg at 05/21/15 0956  . potassium chloride SA (K-DUR,KLOR-CON) CR tablet 40 mEq  40 mEq Oral Daily Leighton Parody, PA-C   40 mEq at 05/21/15 0948  . senna-docusate (Senokot-S) tablet 1 tablet  1 tablet Oral QHS PRN Leighton Parody, PA-C      . sodium phosphate (FLEET) 7-19 GM/118ML enema 1 enema  1 enema Rectal Once PRN Leighton Parody, PA-C      . tamsulosin [facility]) capsule 0.4 mg  0.4 mg Oral QPC supper Leighton Parody, PA-C      . thiamine (VITAMIN B-1) tablet 100 mg  100 mg Oral Daily Leighton Parody, PA-C   100 mg at 05/21/15 0946  . tranexamic acid (LYSTEDA) tablet 1,300 mg  1,300 mg Oral Once Leighton Parody, PA-C         Discharge Medications: Please see discharge summary for a list of discharge medications.  Relevant Imaging Results:  Relevant Lab Results:   Additional Information [id number]  Thomas [name], [place]

## 2015-05-21 NOTE — Care Management Note (Signed)
Case Management Note  Patient Details  Name: Thomas [name] MRN: [id number] Date of Birth: Jan 13, 1951  Subjective/Objective:              S/p right total hip arthroplasty      Action/Plan: PT recommended SNF. Referral made to CSW. Spoke with patient, he is agreeable with SNF, first choice is [facility], information relayed to CSW.    Expected Discharge Date:                  Expected Discharge Plan:  Skilled Nursing Facility  In-House Referral:  Clinical Social Work  Discharge planning Services  CM Consult  Post Acute Care Choice:  NA Choice offered to:     DME Arranged:  N/A DME Agency:  NA  HH Arranged:  NA HH Agency:  NA  Status of Service:  In process, will continue to follow  Medicare Important Message Given:    Date Medicare IM Given:    Medicare IM give by:    Date Additional Medicare IM Given:    Additional Medicare Important Message give by:     If discussed at [place] of Stay Meetings, dates discussed:    Additional Comments:  Nila Nephew, RN 05/21/2015, 2:10 PM

## 2015-05-21 NOTE — Progress Notes (Signed)
Persistent hypotension with standing activity. Pt is asymptomatic. MD made aware, no new orders given. Will continue to monitor pt.  . Orthostatic VS for the past 24 hrs (Last 3 readings):  BP- Lying Pulse- Lying BP- Sitting Pulse- Sitting BP- Standing at 0 minutes Pulse- Standing at 0 minutes BP- Standing at 3 minutes Pulse- Standing at 3 minutes  05/21/15 1500 116/71 mmHg 103 - - - - - -  05/21/15 1445 104/59 mmHg 99 104/87 mmHg 117 93/73 mmHg 91 (!) 78/61 mmHg 130

## 2015-05-21 NOTE — Progress Notes (Signed)
Physical Therapy Treatment Patient Details Name: Thomas [name] MRN: [id number] DOB: 1950/06/28 Today's Date: 05/21/2015    History of Present Illness Admitted for RTHA;  has a past medical history of Obesity; Arthritis; Hypertension; Chronic systolic CHF (congestive heart failure), NYHA class 2 ([name]); DCM (dilated cardiomyopathy) ([place]); Pancytopenia ([place]); ETOH abuse; Alcoholic liver disease ([place]); Gastroduodenitis; Chronic atrial fibrillation ([place]); Diverticulosis of colon; Portal hypertensive gastropathy; Personal history of colonic polyps - adenomas (0000000); and Helicobacter pylori gastritis (10/19/2013).     PT Comments    Noting improvements in mobility, needing less assist for transfers, amb; Cues to self-monitor for activity tolerance; Persistent Hypotension with standing activity, with significant SBP drop after ambulating -- no symptoms of lightheadedness    05/21/15 1445 05/21/15 1500  Vital Signs  Patient Position (if appropriate) Orthostatic Vitals --   Orthostatic Lying   BP- Lying 104/59 mmHg 116/71 mmHg (after assisting pt back to bed)  Pulse- Lying 99 103  Orthostatic Sitting  BP- Sitting 104/87 mmHg --   Pulse- Sitting 117 --   Orthostatic Standing at 0 minutes  BP- Standing at 0 minutes 93/73 mmHg --   Pulse- Standing at 0 minutes 91 --   Orthostatic Standing at 3 minutes  BP- Standing at 3 minutes (!) 78/61 mmHg (after walking) --   Pulse- Standing at 3 minutes 130 --      Follow Up Recommendations  SNF     Equipment Recommendations  Rolling walker with 5" wheels;3in1 (PT)    Recommendations for Other Services OT consult     Precautions / Restrictions Precautions Precautions: Posterior Hip;Fall Precaution Booklet Issued: Yes (comment) Precaution Comments: Patient able to recall precautions and maintains precautions with functional mobility.   Restrictions Weight Bearing Restrictions: Yes RLE Weight Bearing: Weight bearing as tolerated     Mobility  Bed Mobility Overal bed mobility: Needs Assistance Bed Mobility: Supine to Sit;Sit to Supine     Supine to sit: Min guard Sit to supine: Min guard   General bed mobility comments: Minguard to monitor for Posterior Precautions  Transfers Overall transfer level: Needs assistance Equipment used: Rolling walker (2 wheeled) Transfers: Sit to/from Stand Sit to Stand: Min assist         General transfer comment: Cues for precautions and hand placement; min assist to initiate boost up; pt essentially pulled up on RW despite cues  Ambulation/Gait Ambulation/Gait assistance: Min guard Ambulation Distance (Feet): 45 Feet Assistive device: Rolling walker (2 wheeled) Gait Pattern/deviations: Decreased step length - right;Decreased step length - left     General Gait Details: Cues and close monitor for post prec with turns; Cues also to self-monitor for activity tolerance; no reports of lightheadedness   Stairs            Wheelchair Mobility    Modified Rankin (Stroke Patients Only)       Balance Overall balance assessment: Needs assistance           Standing balance-Leahy Scale: Poor                      Cognition Arousal/Alertness: Awake/alert Behavior During Therapy: WFL for tasks assessed/performed Overall Cognitive Status: Within Functional Limits for tasks assessed                      Exercises Total Joint Exercises Quad Sets: AROM;Right;5 reps Gluteal Sets: AROM;Both;5 reps Heel Slides: AAROM;Right;5 reps Hip ABduction/ADduction: AAROM;Right;5 reps    General Comments General comments (skin integrity,  edema, etc.):       Pertinent Vitals/Pain Pain Assessment: 0-10 Pain Score: 3  Pain Location: R hip Pain Descriptors / Indicators: Sore Pain Intervention(s): Monitored during session    Home Living Family/patient expects to be discharged to:: Private residence Living Arrangements: Parent Available Help at  Discharge: Family;Available 24 hours/day (cannot give physical assist) Type of Home: House Home Access: Stairs to enter Entrance Stairs-Rails: Right Home Layout: One level Home Equipment: Walker - 2 wheels;Cane - single point      Prior Function        Comments: pt's mother does the grocery shopping, cooking and cleaning. pt uses a cane sometimes   PT Goals (current goals can now be found in the care plan section) Acute Rehab PT Goals Patient Stated Goal: walk without pain PT Goal Formulation: With patient Time For Goal Achievement: 05/28/15 Potential to Achieve Goals: Good Progress towards PT goals: Progressing toward goals    Frequency  7X/week    PT Plan Current plan remains appropriate    Co-evaluation             End of Session Equipment Utilized During Treatment: Gait belt Activity Tolerance: Patient tolerated treatment well Patient left: in bed;with call bell/phone within reach     Time: 1441-1500 PT Time Calculation (min) (ACUTE ONLY): 19 min  Charges:  $Gait Training: 8-22 mins                    G Codes:      Thomas [name] 05/21/2015, 3:49 PM  Thomas [name], [place] Pager [phone] Office [phone]

## 2015-05-21 NOTE — Progress Notes (Signed)
Occupational Therapy Evaluation Patient Details Name: Thomas [name] MRN: [id number] DOB: May 06, 1950 Today's Date: 05/21/2015    History of Present Illness Admitted for RTHA;  has a past medical history of Obesity; Arthritis; Hypertension; Chronic systolic CHF (congestive heart failure), NYHA class 2 ([name]); DCM (dilated cardiomyopathy) ([place]); Pancytopenia ([place]); ETOH abuse; Alcoholic liver disease ([place]); Gastroduodenitis; Chronic atrial fibrillation ([place]); Diverticulosis of colon; Portal hypertensive gastropathy; Personal history of colonic polyps - adenomas (0000000); and Helicobacter pylori gastritis (10/19/2013).    Clinical Impression   PTA, pt was independent with ADLs, used [facility] for mobility, and pt's mother complete IADLs. Pt currently requires min assist for functional transfers and refused to demonstrate or practice LB ADLs so level of assist needed for these tasks is unknown at this time. Pt also ignored max verbal cues to slow down, adhere to posterior hip precautions, and to complete transfers safely. Pt continually laughed at therapist and stated "You don't understand. I got this. I don't need to show you. I'm going home and know how to do this like I always have." Discussed with pt the importance of completing transfers and ADLs using compensatory strategies to adhere to precautions and to demonstrate ability to complete these tasks, so OT and care team know what kind of assistance pt will need on d/c. Recommending SNF at this time for post-acute rehab stay due to pt's balance deficits, ataxic movements, and decreased safety awareness.    Follow Up Recommendations  SNF    Equipment Recommendations  Other (comment) (TBD in next venue)    Recommendations for Other Services       Precautions / Restrictions Precautions Precautions: Posterior Hip;Fall Precaution Booklet Issued: No Precaution Comments: Able to recall 2/3 precautions. Restrictions Weight Bearing Restrictions:  Yes RLE Weight Bearing: Weight bearing as tolerated      Mobility Bed Mobility Overal bed mobility: Needs Assistance Bed Mobility: Supine to Sit;Sit to Supine     Supine to sit: Min guard Sit to supine: Min guard   General bed mobility comments: HOB flat, used bedrails. Min guard and verbal cues to adhere to posterior  hip precautions. Pt continues to break precautions despite all cues  Transfers Overall transfer level: Needs assistance Equipment used: Rolling walker (2 wheeled) Transfers: Sit to/from Stand Sit to Stand: Min assist         General transfer comment: Min assist for boost to stand and to stabilize RW. Verbal cues for proper technique to adhere to precautions, but pt ignored these cues and broke precautions. Verbal cues for safe hand placement on seated surfaces.    Balance Overall balance assessment: Needs assistance Sitting-balance support: No upper extremity supported;Feet supported Sitting balance-Leahy Scale: Fair     Standing balance support: Bilateral upper extremity supported;During functional activity Standing balance-Leahy Scale: Poor Standing balance comment: Staggering and ataxic movements when at sink.                             ADL Overall ADL's : Needs assistance/impaired     Grooming: Wash/dry hands;Min guard;Cueing for safety;Standing           Upper Body Dressing : Set up;Sitting   Lower Body Dressing: Minimal assistance;Cueing for compensatory techniques;Cueing for safety;Sit to/from stand   Toilet Transfer: Min guard;Cueing for safety;Ambulation;BSC;RW   Toileting- Clothing Manipulation and Hygiene: Min guard;Cueing for safety;Cueing for compensatory techniques;Sit to/from stand       Functional mobility during ADLs: Min guard;Rolling walker;Cueing for safety  General ADL Comments: Verbal cues throughout session to slow down and adhere to posterior hip precautions. Pt very impulsive and continually laughed a  therapist stating "I got this. I can do that and I don't need to show you." Discussed with pt importance of demonstrating ability to complete ADLs so MD and rest of care team know what kind of assistance he may need and that he can do these things safely at home. Pt demonstrated ataxic movements when completing transfers and ignored cues to wait for therapist assistance and almost pulled IV out when turning to sit on commode. Reports he knows how to complete ADLs and says he can do it like always has done it and refused to practice with compensatory strategies to adhere to precautions.     Vision Vision Assessment?: No apparent visual deficits   Perception     Praxis      Pertinent Vitals/Pain Pain Assessment: 0-10 Pain Score: 3  Pain Location: R hip Pain Descriptors / Indicators: Sore Pain Intervention(s): Limited activity within patient's tolerance;Monitored during session;Repositioned;Ice applied     Hand Dominance Right   Extremity/Trunk Assessment Upper Extremity Assessment Upper Extremity Assessment: Overall WFL for tasks assessed   Lower Extremity Assessment Lower Extremity Assessment: Generalized weakness;RLE deficits/detail RLE Deficits / Details: Grossly decr AROM and strength, limited by pain postop   Cervical / Trunk Assessment Cervical / Trunk Assessment: Normal   Communication Communication Communication: No difficulties   Cognition Arousal/Alertness: Awake/alert Behavior During Therapy: Impulsive;Restless Overall Cognitive Status: No family/caregiver present to determine baseline cognitive functioning                     General Comments       Exercises       Shoulder Instructions      Home Living Family/patient expects to be discharged to:: Private residence Living Arrangements: Parent Available Help at Discharge: Family;Available 24 hours/day (cannot provide physical assist) Type of Home: House Home Access: Stairs to enter [employer] of Steps: 2 Entrance Stairs-Rails: Right Home Layout: One level     Bathroom Shower/Tub: Tub/shower unit;Curtain Shower/tub characteristics: Architectural technologist: Handicapped height Bathroom Accessibility: Yes How Accessible: Accessible via walker Home Equipment: [place] - 2 wheels;Cane - single point;Bedside commode;Shower seat;Grab bars - tub/shower;Hand held shower head;Adaptive equipment Adaptive Equipment: Sock aid;Reacher;Long-handled shoe horn;Long-handled sponge Additional Comments: Pt reports he was at a rehab facility in December      Prior Functioning/Environment Level of Independence: Needs assistance  Gait / Transfers Assistance Needed: uses cane occassionally ADL's / Homemaking Assistance Needed: pt baths and dresses self, mother does all IADLs        OT Diagnosis: Acute pain;Generalized weakness   OT Problem List: Decreased strength;Decreased range of motion;Decreased activity tolerance;Impaired balance (sitting and/or standing);Decreased coordination;Decreased safety awareness;Decreased knowledge of precautions;Decreased knowledge of use of DME or AE;Obesity;Pain   OT Treatment/Interventions: Self-care/ADL training;Therapeutic exercise;Energy conservation;DME and/or AE instruction;Therapeutic activities;Patient/family education;Balance training    OT Goals(Current goals can be found in the care plan section) Acute Rehab OT Goals Patient Stated Goal: to go home OT Goal Formulation: With patient Time For Goal Achievement: 06/04/15 Potential to Achieve Goals: Good ADL Goals Pt Will Perform Grooming: with supervision;standing Pt Will Perform Lower Body Bathing: with supervision;with adaptive equipment;sitting/lateral leans;sit to/from stand Pt Will Perform Lower Body Dressing: with supervision;with adaptive equipment;sitting/lateral leans;sit to/from stand Pt Will Transfer to Toilet: with supervision;ambulating;bedside commode (with BSC over  toilet) Pt Will Perform Toileting - Clothing Manipulation and hygiene: with  supervision;with adaptive equipment;sitting/lateral leans;sit to/from stand Pt Will Perform Tub/Shower Transfer: Tub transfer;with supervision;ambulating;shower seat;rolling walker;grab bars  OT Frequency: Min 2X/week   Barriers to D/C: Decreased caregiver support  Pt's mother unable to provide any assistance at home       Co-evaluation              End of Session Equipment Utilized During Treatment: Gait belt;Rolling walker Nurse Communication: Mobility status  Activity Tolerance: Patient tolerated treatment well Patient left: in bed;with bed alarm set;with call bell/phone within reach   Time: 1527-1549 OT Time Calculation (min): 22 min Charges:  OT General Charges $OT Visit: 1 Procedure OT Evaluation $OT Eval Moderate Complexity: 1 Procedure G-Codes:    Redmond Baseman, OTR/L [phone] 05/21/2015, 5:11 PM

## 2015-05-22 LAB — CBC
HEMATOCRIT: 23.8 % — AB (ref 39.0–52.0)
HEMOGLOBIN: 8.3 g/dL — AB (ref 13.0–17.0)
MCH: 31.8 pg (ref 26.0–34.0)
MCHC: 34.9 g/dL (ref 30.0–36.0)
MCV: 91.2 fL (ref 78.0–100.0)
Platelets: 98 10*3/uL — ABNORMAL LOW (ref 150–400)
RBC: 2.61 MIL/uL — AB (ref 4.22–5.81)
RDW: 14.1 % (ref 11.5–15.5)
WBC: 11 10*3/uL — AB (ref 4.0–10.5)

## 2015-05-22 NOTE — Progress Notes (Signed)
Physical Therapy Treatment Patient Details Name: Mayfield Demichael MRN: [id number] DOB: 02-Jan-1951 Today's Date: 05/22/2015    History of Present Illness Admitted for RTHA;  has a past medical history of Obesity; Arthritis; Hypertension; Chronic systolic CHF (congestive heart failure), NYHA class 2 ([name]); DCM (dilated cardiomyopathy) ([place]); Pancytopenia ([place]); ETOH abuse; Alcoholic liver disease ([place]); Gastroduodenitis; Chronic atrial fibrillation ([place]); Diverticulosis of colon; Portal hypertensive gastropathy; Personal history of colonic polyps - adenomas (0000000); and Helicobacter pylori gastritis (10/19/2013).     PT Comments    Session focused on therex as we did not get the chance to perform exercises earlier session; Mr. Villapando is very motivated to get better, and hopes to get home soon; I anticipate good progress at rehab, and instructed pt to make sure to notify therapists at rehab if he feels lightheaded while working with therapies; I encouraged him to ask them to take his BP as well  Follow Up Recommendations  SNF     Equipment Recommendations  Rolling walker with 5" wheels;3in1 (PT)    Recommendations for Other Services OT consult     Precautions / Restrictions Precautions Precautions: Posterior Hip;Fall Precaution Comments: Able to recall 2/3 precautions. With prompting able to state no internal rotation Restrictions RLE Weight Bearing: Weight bearing as tolerated    Mobility  Bed Mobility Overal bed mobility: Needs Assistance Bed Mobility: Supine to Sit;Sit to Supine     Supine to sit: Min guard Sit to supine: Min guard   General bed mobility comments: Minguard to monitor for Posterior Precautions  Transfers Overall transfer level: Needs assistance Equipment used: Rolling walker (2 wheeled) Transfers: Sit to/from Stand Sit to Stand: Min assist         General transfer comment: Close guard to monitor for too much hip flexion with transition to stand; cues  to control descent with stand to sit  Ambulation/Gait Ambulation/Gait assistance: Min guard Ambulation Distance (Feet): 5 Feet (recliner to bed) Assistive device: Rolling walker (2 wheeled) Gait Pattern/deviations: Step-through pattern     General Gait Details: Cues and close monitor for post prec with turns; Cues also to self-monitor for activity tolerance; no lightheadedness with short distance and short time in standing   Stairs Stairs: Yes Stairs assistance: Min guard Stair Management: Two rails;Step to pattern;Forwards Number of Stairs: 2 General stair comments: Cues for sequence  Wheelchair Mobility    Modified Rankin (Stroke Patients Only)       Balance                                    Cognition Arousal/Alertness: Awake/alert Behavior During Therapy: WFL for tasks assessed/performed Overall Cognitive Status: Within Functional Limits for tasks assessed       Memory: Decreased recall of precautions              Exercises Total Joint Exercises Quad Sets: AROM;Right;10 reps Gluteal Sets: AROM;Both;10 reps Towel Squeeze: AROM;Both;10 reps Heel Slides: AAROM;Right;10 reps Hip ABduction/ADduction: AAROM;Right;10 reps Bridges: AROM;Both;10 reps    General Comments General comments (skin integrity, edema, etc.):       Pertinent Vitals/Pain Pain Assessment: Faces Pain Score: 3  Faces Pain Scale: Hurts little more Pain Location: R hip with hip abd/add therex Pain Descriptors / Indicators: Aching Pain Intervention(s): Monitored during session;Limited activity within patient's tolerance    Home Living  Prior Function            PT Goals (current goals can now be found in the care plan section) Acute Rehab PT Goals Patient Stated Goal: to go home PT Goal Formulation: With patient Time For Goal Achievement: 05/28/15 Potential to Achieve Goals: Good Progress towards PT goals: Progressing toward goals     Frequency  7X/week    PT Plan Current plan remains appropriate    Co-evaluation             End of Session Equipment Utilized During Treatment: Gait belt Activity Tolerance: Patient tolerated treatment well Patient left: in bed;Other (comment);with call bell/phone within reach (sitting EOB at pt's request)     Time: [id number] PT Time Calculation (min) (ACUTE ONLY): 17 min  Charges:  $Gait Training: 23-37 mins $Therapeutic Exercise: 8-22 mins                    G Codes:      Roney Raymund Hamff 05/22/2015, 2:05 PM  Roney Eason, [place] Pager [phone] Office [phone]

## 2015-05-22 NOTE — Progress Notes (Signed)
Pt for d/c today and has accepted offer from [facility]. SW confirmed with Santiago Glad at facility they are able to accept pt today. DC summary and transfer packet sent to facility via hub. Pt reports his mother will provide transport. RN aware Rx to go with pt to facility. CSW signing off at d/c.   Wandra Feinstein, MSW, LCSW

## 2015-05-22 NOTE — Progress Notes (Signed)
Physical Therapy Treatment Patient Details Name: Thomas [name] MRN: [id number] DOB: 01/07/1951 Today's Date: 05/22/2015    History of Present Illness Admitted for RTHA;  has a past medical history of Obesity; Arthritis; Hypertension; Chronic systolic CHF (congestive heart failure), NYHA class 2 ([name]); DCM (dilated cardiomyopathy) ([place]); Pancytopenia ([place]); ETOH abuse; Alcoholic liver disease ([place]); Gastroduodenitis; Chronic atrial fibrillation ([place]); Diverticulosis of colon; Portal hypertensive gastropathy; Personal history of colonic polyps - adenomas ([place]); and Helicobacter pylori gastritis (10/19/2013).     PT Comments    Noting improvements in functional mobility and Thomas [name]' self confidence is appropriately boosted; Still with a SBP drop of greater than 20 mmHg between supine and standing after upright activity and walking as follows:     05/22/15 1050  Vital Signs  Patient Position (if appropriate) Orthostatic Vitals  Orthostatic Lying   BP- Lying 112/87 mmHg  Pulse- Lying 87  Orthostatic Sitting  BP- Sitting 100/71 mmHg  Pulse- Sitting 109  Orthostatic Standing at 0 minutes  BP- Standing at 0 minutes 105/67 mmHg  Pulse- Standing at 0 minutes 115  Orthostatic Standing at 3 minutes  BP- Standing at 3 minutes (!) 81/48 mmHg (After amb )  Pulse- Standing at 3 minutes 122     Follow Up Recommendations  SNF     Equipment Recommendations  Rolling walker with 5" wheels;3in1 (PT)    Recommendations for Other Services OT consult     Precautions / Restrictions Precautions Precautions: Posterior Hip;Fall Precaution Comments: Able to recall 2/3 precautions. With prompting able to state no internal rotation Restrictions Weight Bearing Restrictions: Yes RLE Weight Bearing: Weight bearing as tolerated    Mobility  Bed Mobility Overal bed mobility: Needs Assistance Bed Mobility: Supine to Sit     Supine to sit: Min guard     General bed mobility comments:  [place] to monitor for Posterior Precautions  Transfers Overall transfer level: Needs assistance Equipment used: Rolling walker (2 wheeled) Transfers: Sit to/from Stand Sit to Stand: Min guard         General transfer comment: Close guard to monitor for too much hip flexion with transition to stand; cues to control descent with stand to sit  Ambulation/Gait Ambulation/Gait assistance: Min guard Ambulation Distance (Feet): 100 Feet (at least) Assistive device: Rolling walker (2 wheeled) Gait Pattern/deviations: Step-through pattern     General Gait Details: Cues and close monitor for post prec with turns; Cues also to self-monitor for activity tolerance; Rw let down a notch and that did improve his steadiness with amb; reports of lightheadedness at the end of the walk, BP 81/48   Stairs Stairs: Yes Stairs assistance: Min guard Stair Management: Two rails;Step to pattern;Forwards Number of Stairs: 2 General stair comments: Cues for sequence  Wheelchair Mobility    Modified Rankin (Stroke Patients Only)       Balance                                    Cognition Arousal/Alertness: Awake/alert Behavior During Therapy: WFL for tasks assessed/performed;Impulsive Overall Cognitive Status: Within Functional Limits for tasks assessed       Memory: Decreased recall of precautions              Exercises      General Comments General comments (skin integrity, edema, etc.):       Pertinent Vitals/Pain Pain Assessment: 0-10 Pain Score: 3  Pain Location: R hip Pain  Descriptors / Indicators: Sore Pain Intervention(s): Monitored during session    Home Living                      Prior Function            PT Goals (current goals can now be found in the care plan section) Acute Rehab PT Goals Patient Stated Goal: to go home PT Goal Formulation: With patient Time For Goal Achievement: 05/28/15 Potential to Achieve Goals:  Good Progress towards PT goals: Progressing toward goals    Frequency  7X/week    PT Plan Current plan remains appropriate    Co-evaluation             End of Session Equipment Utilized During Treatment: Gait belt Activity Tolerance: Patient tolerated treatment well Patient left: in chair;with call bell/phone within reach     Time: 1020 (start time is approximate)-1050 PT Time Calculation (min) (ACUTE ONLY): 30 min  Charges:  $Gait Training: 23-37 mins                    G Codes:      Thomas [name] 05/22/2015, 11:36 AM  Thomas [name], Thomas [name] Pager [phone] Office [phone]

## 2015-05-22 NOTE — Discharge Summary (Signed)
Patient ID: Thomas [name] MRN: [id number] DOB/AGE: 1950-10-27 65 y.o.  Admit date: 05/20/2015 Discharge date: 05/22/2015  Admission Diagnoses:  Principal Problem:   Primary osteoarthritis of right hip   Discharge Diagnoses:  Same  Past Medical History  Diagnosis Date  . Obesity   . Arthritis   . Hypertension   . Chronic systolic CHF (congestive heart failure), NYHA class 2 ([place])   . DCM (dilated cardiomyopathy) ([place])     EF 30% by echo 07/2013 with moderate RV dysfunction and moderate MR  . Pancytopenia ([place])   . ETOH abuse   . Alcoholic liver disease ([place])   . Gastroduodenitis     H Pylori positive  . Chronic atrial fibrillation ([name])     no an anticoagulation canditate due to alcohol abuse, liver cirrhosis with pancytopenia, medical noncompliance  . Diverticulosis of colon   . Portal hypertensive gastropathy   . Personal history of colonic polyps - adenomas 08/21/2013  . Helicobacter pylori gastritis 10/19/2013    Surgeries: Procedure(s): TOTAL HIP ARTHROPLASTY on 05/20/2015   Consultants:    Discharged Condition: Improved  Hospital Course: Thomas [name] is an 65 y.o. male who was admitted 05/20/2015 for operative treatment ofPrimary osteoarthritis of right hip. Patient has severe unremitting pain that affects sleep, daily activities, and work/hobbies. After pre-op clearance the patient was taken to the operating room on 05/20/2015 and underwent  Procedure(s): TOTAL HIP ARTHROPLASTY.    Patient was given perioperative antibiotics: Anti-infectives    Start     Dose/Rate Route Frequency Ordered Stop   05/20/15 1230  ceFAZolin (ANCEF) 3 g in dextrose 5 % 50 mL IVPB     3 g 130 mL/hr over 30 Minutes Intravenous To ShortStay Surgical 05/17/15 0930 05/20/15 1250       Patient was given sequential compression devices, early ambulation, and chemoprophylaxis to prevent DVT.  Patient benefited maximally from hospital stay and there were no complications.    Recent vital  signs: Patient Vitals for the past 24 hrs:  BP Temp Temp src Pulse Resp SpO2  05/22/15 0527 116/73 mmHg 98.8 F (37.1 C) - 87 17 98 %  05/21/15 2250 103/69 mmHg 98.5 F (36.9 C) Oral 92 17 98 %  05/21/15 1746 112/82 mmHg - - - - -  05/21/15 1425 121/68 mmHg 98.4 F (36.9 C) - 88 18 92 %  05/21/15 1300 103/68 mmHg - - - - -  05/21/15 1111 (!) 81/59 mmHg - - - - -     Recent laboratory studies:  Recent Labs  05/21/15 0635  WBC 7.0  HGB 8.2*  HCT 23.8*  PLT 116*     Discharge Medications:     Medication List    TAKE these medications        aspirin EC 325 MG tablet  Take 1 tablet (325 mg total) by mouth 2 (two) times daily.     carvedilol 12.5 MG tablet  Commonly known as:  COREG  Take 1 tablet (12.5 mg total) by mouth 2 (two) times daily with a meal.     folic acid 1 MG tablet  Commonly known as:  FOLVITE  Take 1 tablet (1 mg total) by mouth daily.     furosemide 40 MG tablet  Commonly known as:  LASIX  Take 1 tablet (40 mg total) by mouth daily.     lidocaine 5 %  Commonly known as:  LIDODERM  Place 1 patch onto the skin daily. Remove & Discard patch within 12 hours  or as directed by MD     losartan 25 MG tablet  Commonly known as:  COZAAR  Take 0.5 tablets (12.5 mg total) by mouth daily.     methocarbamol 500 MG tablet  Commonly known as:  ROBAXIN  Take 1 tablet (500 mg total) by mouth 2 (two) times daily with a meal.     multivitamin with minerals Tabs tablet  Take 1 tablet by mouth daily.     oxyCODONE-acetaminophen 5-325 MG tablet  Commonly known as:  ROXICET  Take 1 tablet by mouth every 4 (four) hours as needed.     potassium chloride SA 20 MEQ tablet  Commonly known as:  K-DUR,KLOR-CON  Take 2 tablets (40 mEq total) by mouth daily.     thiamine 100 MG tablet  Take 1 tablet (100 mg total) by mouth daily.        Diagnostic Studies: Dg Chest 2 View  05/09/2015  CLINICAL DATA:  Hip surgery. EXAM: CHEST  2 VIEW COMPARISON:  02/25/2015.  FINDINGS: Mediastinum hilar structures normal. Heart size normal. Low lung volumes with mild basilar atelectasis. No pleural effusion or pneumothorax. No acute bony abnormality. Degenerative changes both shoulders and thoracic spine IMPRESSION: Low lung volumes.  Otherwise negative chest . Electronically Signed   By: Marcello Moores  Register   On: 05/09/2015 13:43   Dg Pelvis Portable  05/20/2015  CLINICAL DATA:  65 year old male status post right hip replacement. Initial encounter. EXAM: PORTABLE PELVIS 1-2 VIEWS COMPARISON:  02/25/2015 FINDINGS: Portable AP view at 1510 hours. Right hip arthroplasty hardware in place and appears normally aligned on this AP view. Hardware intact. Preexisting bulky osteophytosis at the right inferior pubic ramus. Other visible osseous structures appear stable. IMPRESSION: Right hip arthroplasty with no adverse features. Electronically Signed   By: Genevie Ann M.D.   On: 05/20/2015 15:22    Disposition: 03-Skilled Nursing Facility      Discharge Instructions    Call MD / Call 911    Complete by:  As directed   If you experience chest pain or shortness of breath, CALL 911 and be transported to the hospital emergency room.  If you develope a fever above 101 F, pus (white drainage) or increased drainage or redness at the wound, or calf pain, call your surgeon's office.     Constipation Prevention    Complete by:  As directed   Drink plenty of fluids.  Prune juice may be helpful.  You may use a stool softener, such as Colace (over the counter) 100 mg twice a day.  Use MiraLax (over the counter) for constipation as needed.     Diet - low sodium heart healthy    Complete by:  As directed      Driving restrictions    Complete by:  As directed   No driving for 2 weeks     Follow the hip precautions as taught in Physical Therapy    Complete by:  As directed      Increase activity slowly as tolerated    Complete by:  As directed      Patient may shower    Complete by:  As  directed   You may shower without a dressing once there is no drainage.  Do not wash over the wound.  If drainage remains, cover wound with plastic wrap and then shower.           Follow-up Information    Follow up with Kerin Salen, MD In 2 weeks.  Specialty:  Orthopedic Surgery   Contact information:   [place] [address] [phone]        Signed: Yerachmiel, Prizzi ERIC R 05/22/2015, 7:18 AM

## 2015-05-22 NOTE — Progress Notes (Signed)
Pt. And his family got d/c instructions,prescriptions and follow up appointments.Family ready to take pt. To [facility].Report was called to [facility] to [place].

## 2015-05-22 NOTE — Progress Notes (Signed)
PATIENT ID: Thomas [name]  MRN: [id number]  DOB/AGE:  [birth year] / 65 y.o.  2 Days Post-Op Procedure(s) (LRB): TOTAL HIP ARTHROPLASTY (Right)    PROGRESS NOTE Subjective: Patient is alert, oriented, no Nausea, no Vomiting, yes passing gas, . Taking PO well. Denies SOB, Chest or Calf Pain. Using Incentive Spirometer, PAS in place. Ambulate WBAT with pt walking 45 ft. Patient reports pain as  4/10  .    Objective: Vital signs in last 24 hours: Filed Vitals:   05/21/15 1425 05/21/15 1746 05/21/15 2250 05/22/15 0527  BP: 121/68 112/82 103/69 116/73  Pulse: 88  92 87  Temp: 98.4 F (36.9 C)  98.5 F (36.9 C) 98.8 F (37.1 C)  TempSrc:   Oral   Resp: 18  17 17   Height:      Weight:      SpO2: 92%  98% 98%      Intake/Output from previous day: I/O last 3 completed shifts: In: 2028.1 [P.O.:600; I.V.:1428.1] Out: 1700 [Urine:1700]   Intake/Output this shift:     LABORATORY DATA:  Recent Labs  05/21/15 0635  WBC 7.0  HGB 8.2*  HCT 23.8*  PLT 116*    Examination: Neurologically intact Neurovascular intact Sensation intact distally Intact pulses distally Dorsiflexion/Plantar flexion intact Incision: dressing C/D/I and no drainage No cellulitis present Compartment soft} XR AP&Lat of hip shows well placed\fixed THA  Assessment:   2 Days Post-Op Procedure(s) (LRB): TOTAL HIP ARTHROPLASTY (Right) ADDITIONAL DIAGNOSIS:  Expected Acute Blood Loss Anemia, Hypertension, afib, chronic CHF, hx transfusions  Plan: PT/OT WBAT, THA posterior hip precautions  DVT Prophylaxis: SCDx72 hrs, ASA 325 mg BID x 2 weeks  DISCHARGE PLAN: Home, when pt passes therapy goals  DISCHARGE NEEDS: HHPT, HHRN, Walker and 3-in-1 comode seat

## 2015-06-12 ENCOUNTER — Emergency Department (HOSPITAL_COMMUNITY): Payer: Commercial Managed Care - HMO

## 2015-06-12 ENCOUNTER — Emergency Department (HOSPITAL_COMMUNITY)
Admission: EM | Admit: 2015-06-12 | Discharge: 2015-06-12 | Disposition: A | Discharge status: Home / Self Care | Payer: Commercial Managed Care - HMO | Attending: Emergency Medicine | Admitting: Emergency Medicine

## 2015-06-12 ENCOUNTER — Encounter (HOSPITAL_COMMUNITY): Payer: Self-pay

## 2015-06-12 DIAGNOSIS — IMO0001 Reserved for inherently not codable concepts without codable children: Secondary | ICD-10-CM

## 2015-06-12 DIAGNOSIS — Z9889 Other specified postprocedural states: Secondary | ICD-10-CM | POA: Diagnosis not present

## 2015-06-12 DIAGNOSIS — I10 Essential (primary) hypertension: Secondary | ICD-10-CM | POA: Diagnosis not present

## 2015-06-12 DIAGNOSIS — Z8719 Personal history of other diseases of the digestive system: Secondary | ICD-10-CM | POA: Insufficient documentation

## 2015-06-12 DIAGNOSIS — Z79899 Other long term (current) drug therapy: Secondary | ICD-10-CM | POA: Insufficient documentation

## 2015-06-12 DIAGNOSIS — I482 Chronic atrial fibrillation: Secondary | ICD-10-CM | POA: Insufficient documentation

## 2015-06-12 DIAGNOSIS — T84020A Dislocation of internal right hip prosthesis, initial encounter: Secondary | ICD-10-CM | POA: Diagnosis not present

## 2015-06-12 DIAGNOSIS — X501XXA Overexertion from prolonged static or awkward postures, initial encounter: Secondary | ICD-10-CM | POA: Diagnosis not present

## 2015-06-12 DIAGNOSIS — Z8619 Personal history of other infectious and parasitic diseases: Secondary | ICD-10-CM | POA: Diagnosis not present

## 2015-06-12 DIAGNOSIS — I5022 Chronic systolic (congestive) heart failure: Secondary | ICD-10-CM | POA: Insufficient documentation

## 2015-06-12 DIAGNOSIS — Y998 Other external cause status: Secondary | ICD-10-CM | POA: Diagnosis not present

## 2015-06-12 DIAGNOSIS — Z86010 Personal history of colonic polyps: Secondary | ICD-10-CM | POA: Insufficient documentation

## 2015-06-12 DIAGNOSIS — S73004A Unspecified dislocation of right hip, initial encounter: Secondary | ICD-10-CM

## 2015-06-12 DIAGNOSIS — Y9289 Other specified places as the place of occurrence of the external cause: Secondary | ICD-10-CM | POA: Diagnosis not present

## 2015-06-12 DIAGNOSIS — Z7982 Long term (current) use of aspirin: Secondary | ICD-10-CM | POA: Insufficient documentation

## 2015-06-12 DIAGNOSIS — E669 Obesity, unspecified: Secondary | ICD-10-CM | POA: Insufficient documentation

## 2015-06-12 DIAGNOSIS — Y792 Prosthetic and other implants, materials and accessory orthopedic devices associated with adverse incidents: Secondary | ICD-10-CM | POA: Insufficient documentation

## 2015-06-12 DIAGNOSIS — M1990 Unspecified osteoarthritis, unspecified site: Secondary | ICD-10-CM | POA: Insufficient documentation

## 2015-06-12 DIAGNOSIS — S79911A Unspecified injury of right hip, initial encounter: Secondary | ICD-10-CM | POA: Diagnosis present

## 2015-06-12 DIAGNOSIS — Y9389 Activity, other specified: Secondary | ICD-10-CM | POA: Diagnosis not present

## 2015-06-12 DIAGNOSIS — Z862 Personal history of diseases of the blood and blood-forming organs and certain disorders involving the immune mechanism: Secondary | ICD-10-CM | POA: Insufficient documentation

## 2015-06-12 MED ORDER — PROPOFOL 10 MG/ML IV BOLUS
1.0000 mg/kg | Freq: Once | INTRAVENOUS | Status: AC
  Administered 2015-06-12: 65 mg via INTRAVENOUS
  Filled 2015-06-12: qty 20

## 2015-06-12 MED ORDER — OXYCODONE-ACETAMINOPHEN 5-325 MG PO TABS: 2.0000 | ORAL_TABLET | ORAL | Status: DC | PRN

## 2015-06-12 MED ORDER — PROPOFOL 10 MG/ML IV BOLUS
INTRAVENOUS | Status: DC | PRN
  Administered 2015-06-12: 100 mg via INTRAVENOUS

## 2015-06-12 MED ORDER — PROPOFOL 10 MG/ML IV BOLUS
INTRAVENOUS | Status: DC
Start: 2015-06-12 — End: 2015-06-12
  Filled 2015-06-12: qty 20

## 2015-06-12 MED ORDER — METOCLOPRAMIDE HCL 5 MG/ML IJ SOLN
10.0000 mg | Freq: Once | INTRAMUSCULAR | Status: AC
  Administered 2015-06-12: 10 mg via INTRAVENOUS
  Filled 2015-06-12: qty 2

## 2015-06-12 MED ORDER — PROPOFOL 10 MG/ML IV BOLUS
INTRAVENOUS | Status: DC | PRN
  Administered 2015-06-12: 30 mg via INTRAVENOUS

## 2015-06-12 NOTE — Sedation Documentation (Signed)
MD at bedside. 

## 2015-06-12 NOTE — Sedation Documentation (Signed)
Waiting for Xray

## 2015-06-12 NOTE — Discharge Instructions (Signed)
Avoid flexing your hip as much as possible. Wear the knee immobilizer to help keep your hip extended. Call Dr. Paulo Fruit tomorrow to schedule follow-up appointment   Hip Dislocation Hip dislocation is the displacement of the "ball" at the head of your thigh bone (femur) from its socket in the hip bone (pelvis). The ball-and-socket structure of the hip joint gives it a lot of stability, while allowing it to move freely. Therefore, a lot of force is required to displace the femur from its socket. A hip dislocation is an emergency. If you believe you have dislocated your hip and cannot move your leg, call for help immediately. Do not try to move. CAUSES The most common cause of hip dislocation is motor vehicle accidents. However, force from falls from a height (a ladder or building), injuries from contact sports, or injuries from industrial accidents can be enough to dislocate your hip. SYMPTOMS A hip dislocation is very painful. If you have a dislocated hip, you will not be able to move your hip. If you have nerve damage, you may not have feeling in your lower leg, foot, or ankle.  DIAGNOSIS Usually, your caregiver can diagnose a hip dislocation by looking at the position of your leg. Generally, X-ray exams are done to check for fractures in your femur or pelvis. The leg of the dislocated hip will appear shorter than the other leg, and your foot will be turned inward. TREATMENT  Your caregiver can manipulate your bones back into the joint (reduction). If there are no other complications involved with your dislocation, such as fractures or damage to blood vessels or nerves, this procedure can be done without surgery. Before this procedure, you will be given medicine so that you will not feel pain (anesthetic). Often specialized imaging exams are done after the reduction (magnetic resonance imaging [MRI] or computed tomography [CT]) to check for loose pieces of cartilage or bone in the joint. If a manual  reduction fails or you have nerve damage, damage to your blood vessels, or bone fractures, surgery will be necessary to perform the reduction.  HOME CARE INSTRUCTIONS The following measures can help to reduce pain and speed up the healing process:  Rest your injured joint. Do not move your joint if it is painful. Also, avoid activities similar to the one that caused your injury.  Apply ice to your injured joint for 1 to 2 days after your reduction or as directed by your caregiver. Applying ice helps to reduce inflammation and pain.  Put ice in a plastic bag.  Place a towel between your skin and the bag.  Leave the ice on for 15 to 20 minutes at a time, every 2 hours while you are awake.  Use crutches or a walker as directed by your caregiver.  Exercise your hip and leg as directed by your caregiver.  Take over-the-counter or prescription medicine for pain as directed by your caregiver. SEEK IMMEDIATE MEDICAL CARE IF:  Your pain becomes worse rather than better.  You feel like your hip has become dislocated again. MAKE SURE YOU:  Understand these instructions.  Will watch your condition.  Will get help right away if you are not doing well or get worse.   This information is not intended to replace advice given to you by your health care provider. Make sure you discuss any questions you have with your health care provider.   Document Released: 12/30/2000 Document Revised: 04/27/2014 Document Reviewed: 11/06/2014 Elsevier Interactive Patient Education [employer].

## 2015-06-12 NOTE — ED Provider Notes (Signed)
CSN: [id number]     Arrival date & time 06/12/15  1243 History   First MD Initiated Contact with Patient 06/12/15 1259     Chief Complaint  Patient presents with  . Hip Injury     (Consider location/radiation/quality/duration/timing/severity/associated sxs/prior Treatment) HPI Patient presents to the emergency department with right hip dislocation.  The patient was attempting to put on a compression stocking when he felt a pop in his right hip.  Patient had recent hip replacement surgery.  Patient states that he has minimal pain at this time.  He states that he did not take any medications prior to arrival.  He states that he has never had a dislocation of this hip.  The patient denies chest pain, shortness breath, weakness, numbness, dizziness, headache, blurred vision, back pain, neck pain, fever, incontinence, dysuria, or syncope.  The patient states that movement and palpation make the pain worse Past Medical History  Diagnosis Date  . Obesity   . Arthritis   . Hypertension   . Chronic systolic CHF (congestive heart failure), NYHA class 2 ([place])   . DCM (dilated cardiomyopathy) ([place])     EF 30% by echo 07/2013 with moderate RV dysfunction and moderate MR  . Pancytopenia ([place])   . ETOH abuse   . Alcoholic liver disease ([place])   . Gastroduodenitis     H Pylori positive  . Chronic atrial fibrillation ([name])     no an anticoagulation canditate due to alcohol abuse, liver cirrhosis with pancytopenia, medical noncompliance  . Diverticulosis of colon   . Portal hypertensive gastropathy   . Personal history of colonic polyps - adenomas 08/21/2013  . Helicobacter pylori gastritis 10/19/2013   Past Surgical History  Procedure Laterality Date  . Esophagogastroduodenoscopy N/A 08/21/2013    Procedure: ESOPHAGOGASTRODUODENOSCOPY (EGD);  Surgeon: Jerene Bears, MD;  Location: [facility] ENDOSCOPY;  Service: Endoscopy;  Laterality: N/A;  . Colonoscopy N/A 08/21/2013    Procedure: COLONOSCOPY;  Surgeon: Jerene Bears, MD;  Location: [facility] ENDOSCOPY;  Service: Endoscopy;  Laterality: N/A;  . Total hip arthroplasty Right 05/20/2015    Procedure: TOTAL HIP ARTHROPLASTY;  Surgeon: Frederik Pear, MD;  Location: [place] [facility];  Service: Orthopedics;  Laterality: Right;   Family History  Problem Relation Age of Onset  . Hypertension Father    Social History  Substance Use Topics  . Smoking status: Never Smoker   . Smokeless tobacco: Never Used  . Alcohol Use: 4.8 oz/week    8 Cans of beer per week     Comment: last usage 6 months ago    Review of Systems All other systems negative except as documented in the HPI. All pertinent positives and negatives as reviewed in the HPI.  Allergies  Review of patient's allergies indicates no known allergies.  Home Medications   Prior to Admission medications   Medication Sig Start Date End Date Taking? Authorizing Provider  aspirin EC 325 MG tablet Take 1 tablet (325 mg total) by mouth 2 (two) times daily. 05/20/15  Yes Leighton Parody, PA-C  carvedilol (COREG) 12.5 MG tablet Take 1 tablet (12.5 mg total) by mouth 2 (two) times daily with a meal. 03/27/15  Yes Imogene Burn, PA-C  folic acid (FOLVITE) 1 MG tablet Take 1 tablet (1 mg total) by mouth daily. 04/04/15  Yes Hoyt Koch, MD  furosemide (LASIX) 40 MG tablet Take 1 tablet (40 mg total) by mouth daily. 04/04/15  Yes Hoyt Koch, MD  losartan (COZAAR) 25  MG tablet Take 0.5 tablets (12.5 mg total) by mouth daily. 04/04/15  Yes Hoyt Koch, MD  methocarbamol (ROBAXIN) 500 MG tablet Take 1 tablet (500 mg total) by mouth 2 (two) times daily with a meal. 05/20/15  Yes Leighton Parody, PA-C  Multiple Vitamin (MULTIVITAMIN WITH MINERALS) TABS tablet Take 1 tablet by mouth daily. 08/24/13  Yes Geradine Girt, DO  oxyCODONE-acetaminophen (ROXICET) 5-325 MG tablet Take 1 tablet by mouth every 4 (four) hours as needed. Patient taking differently: Take 1 tablet by mouth every 4 (four) hours as needed  for moderate pain.  05/20/15  Yes Leighton Parody, PA-C  potassium chloride SA (K-DUR,KLOR-CON) 20 MEQ tablet Take 2 tablets (40 mEq total) by mouth daily. 05/13/15  Yes Hoyt Koch, MD  thiamine 100 MG tablet Take 1 tablet (100 mg total) by mouth daily. 04/04/15  Yes Hoyt Koch, MD  lidocaine (LIDODERM) 5 % Place 1 patch onto the skin daily. Remove & Discard patch within 12 hours or as directed by MD Patient taking differently: Place 1 patch onto the skin daily as needed (pain). Remove & Discard patch within 12 hours or as directed by MD 04/05/15   Hoyt Koch, MD   BP 132/90 mmHg  Pulse 75  Temp(Src) 97.9 F (36.6 C) (Oral)  Resp 18  Ht 6' (1.829 m)  Wt 127.007 kg  BMI 37.97 kg/m2  SpO2 100% Physical Exam  Constitutional: He is oriented to person, place, and time. He appears well-developed and well-nourished. No distress.  HENT:  Head: Normocephalic and atraumatic.  Mouth/Throat: Oropharynx is clear and moist.  Eyes: Pupils are equal, round, and reactive to light.  Neck: Normal range of motion. Neck supple.  Cardiovascular: Normal rate, regular rhythm and normal heart sounds.  Exam reveals no gallop and no friction rub.   No murmur heard. Pulmonary/Chest: Effort normal and breath sounds normal. No respiratory distress. He has no wheezes.  Musculoskeletal:       Right hip: He exhibits decreased range of motion, decreased strength, tenderness and deformity.  Neurological: He is alert and oriented to person, place, and time. He exhibits normal muscle tone. Coordination normal.  Skin: Skin is warm and dry. No rash noted. No erythema.  Psychiatric: He has a normal mood and affect. His behavior is normal.  Nursing note and vitals reviewed.   ED Course  Procedures (including critical care time) Labs Review Labs Reviewed - No data to display  Imaging Review Dg Hip Unilat With Pelvis 2-3 Views Right  06/12/2015  CLINICAL DATA:  The patient felt his right hip  pop out today. Pain. Initial encounter. EXAM: DG HIP (WITH OR WITHOUT PELVIS) 2-3V RIGHT COMPARISON:  Plain films the right hip 02/25/2015. FINDINGS: Since the prior examination, the patient has undergone right hip replacement. The device is posterosuperiorly dislocated. No fracture is identified. Marked lower lumbar spondylosis is noted. IMPRESSION: Posterosuperior dislocation right total hip arthroplasty. Negative for acute fracture. Electronically Signed   By: Inge Rise M.D.   On: 06/12/2015 14:14   I have personally reviewed and evaluated these images and lab results as part of my medical decision-making.  I spoke with Dr. Mayer Camel of orthopedic surgery who performed this operation and he advised that he would like for [place] to attempt to reduce the hip.  Patient is made aware of the plan and all questions were answered.  The patient will have a delay in the reduction due to the fact that he ate  food off a meal tray that was brought for another patient, but placed into his room.  Patient only had some 2 Small bites of pasta  with half a bread stick    Dalia Heading, PA-C 06/12/15 1621  Dorie Rank, MD 06/12/15 [phone]

## 2015-06-12 NOTE — ED Notes (Signed)
Per Pt, Pt was at the medical supply store getting compression socks. While placing socks on the right leg, pt's foot slipped and pt heard a pop on the right hip. Pt has HX of hip replacement in January of this year.

## 2015-06-12 NOTE — Sedation Documentation (Signed)
Xray at the bedside.

## 2015-06-12 NOTE — ED Notes (Signed)
Pt remains monitored by blood pressure and pulse ox. Pt is noted to have visitor at bedside.

## 2015-06-12 NOTE — ED Notes (Signed)
Positive bilateral pedal pulses

## 2015-06-12 NOTE — ED Provider Notes (Signed)
The patient presented to the emergency room with complaints of hip pain probable hip dislocation Physical Exam  BP 132/90 mmHg  Pulse 75  Temp(Src) 97.9 F (36.6 C) (Oral)  Resp 18  Ht 6' (1.829 m)  Wt 127.007 kg  BMI 37.97 kg/m2  SpO2 100%  Physical Exam  Constitutional: He appears well-developed and well-nourished. No distress.  HENT:  Head: Normocephalic and atraumatic.  Right Ear: External ear normal.  Left Ear: External ear normal.  Mouth/Throat: No oropharyngeal exudate.  Eyes: Conjunctivae are normal. Right eye exhibits no discharge. Left eye exhibits no discharge. No scleral icterus.  Neck: Neck supple. No tracheal deviation present.  Cardiovascular: Normal rate, regular rhythm and normal heart sounds.   Pulmonary/Chest: Effort normal. No stridor. No respiratory distress.  Musculoskeletal: He exhibits no edema.       Right hip: He exhibits tenderness, bony tenderness and deformity.  Neurological: He is alert. Cranial nerve deficit: no gross deficits.  Skin: Skin is warm and dry. No rash noted.  Psychiatric: He has a normal mood and affect.  Nursing note and vitals reviewed.   ED Course  Procedures  MDM Patient's x-rays show a hip dislocation.  Unfortunately when the patient first arrived,  A meal tray was brought into the room that was supposed to be for the patient that was previously in that room.  The patient ate a portion of that meal.  Currently he is not having any significant pain or discomfort. We will wait a few hours before proceeding with sedation and reduction of his hip.  Dr Mayer Camel was consulted.  We will contact him if we are unable to reduce the hip, otherwise follow up with him in the office if the reduction is successful.   Case turned over to Dr Everlean Patterson, MD 06/12/15 1600

## 2015-06-12 NOTE — ED Notes (Signed)
Pt taken to Xray before this RN could see patient

## 2015-06-12 NOTE — Sedation Documentation (Signed)
Patient denies pain and is resting comfortably.  

## 2015-06-12 NOTE — ED Provider Notes (Signed)
Procedural sedation Performed by: Lolita Patella Consent: Verbal consent obtained. Risks and benefits: risks, benefits and alternatives were discussed Required items: required blood products, implants, devices, and special equipment available Patient identity confirmed: arm band and provided demographic data Time out: Immediately prior to procedure a "time out" was called to verify the correct patient, procedure, equipment, support staff and site/side marked as required.  Sedation type: moderate (conscious) sedation NPO time confirmed and considedered  Sedatives: PROPOFOL  Physician Time at Bedside: 30 minutes  Vitals: Vital signs were monitored during sedation. Cardiac Monitor, pulse oximeter Patient tolerance: Patient tolerated the procedure well with no immediate complications. Comments: Pt with uneventful recovered. Returned to pre-procedural sedation baseline  Reduction of dislocation Date/Time: 8:26 PM Performed by: Lolita Patella Authorized by: Lolita Patella Consent: Verbal consent obtained. Risks and benefits: risks, benefits and alternatives were discussed Consent given by: patient Required items: required blood products, implants, devices, and special equipment available Time out: Immediately prior to procedure a "time out" was called to verify the correct patient, procedure, equipment, support staff and site/side marked as required.  Patient sedated: yes  Vitals: Vital signs were monitored during sedation. Patient tolerance: Patient tolerated the procedure well with no immediate complications. Joint: right prosthetic hip Reduction technique: Flexion, adduction, external rotation  Patient inherited from [facility], PA-C, and Dorie Rank. Risks and benefits of sedation and reduction discussed with patient. He has now been almost 4 hours since his last by mouth which was minimal. Patient tolerated the procedure well without immediate complications.         Tanna Furry, MD 06/12/15 2027

## 2015-06-12 NOTE — ED Notes (Signed)
Knee immobilizer applied as instructed by Dr Jeneen Rinks

## 2015-06-12 NOTE — ED Notes (Signed)
Medication at the bedside

## 2015-06-12 NOTE — Sedation Documentation (Signed)
Vital signs stable. 

## 2015-06-28 ENCOUNTER — Encounter: Payer: Self-pay | Admitting: *Deleted

## 2015-07-01 ENCOUNTER — Encounter: Payer: Self-pay | Admitting: Cardiology

## 2015-07-01 NOTE — Progress Notes (Signed)
Cardiology Office Note   Date:  07/02/2015   ID:  Thomas [name], DOB [birth year], MRN [id number]  PCP:  Hoyt Koch, MD    Chief Complaint  Patient presents with  . Atrial Fibrillation  . Hypertension  . Congestive Heart Failure      History of Present Illness: Thomas [name] is a 65 y.o. male with a history of obesity, ETOH abuse, atrial fibrillation with RVR, pancytopenia from alcoholisma, chronic systolic CHF and DCM felt secondary to alcohol. He was felt not to be a good candidate for anticoagulation due to his alcoholism and medical noncompliance. Lexiscan myoview negative for ischemia. He was fond to have alcoholic liver disease with evidence of cirrhosis on CT.  He is doing well today. He denies any chest pain, SOB, DOE, dizziness, palpitations or syncope. He had DJD of his hip and in January and had a total hip replacement.  In February he dislocated the hip and had to had it put back in place.  Since then he has had a lot of edema in the right leg.    Past Medical History  Diagnosis Date  . Obesity   . Arthritis   . Hypertension   . Chronic systolic CHF (congestive heart failure), NYHA class 2 ([place])   . DCM (dilated cardiomyopathy) ([place])     EF 30% but now improved to 40-45% by echo 2016 on medical therapy with moderate MR  . Pancytopenia ([place])   . ETOH abuse   . Alcoholic liver disease ([place])   . Gastroduodenitis     H Pylori positive  . Chronic atrial fibrillation ([name])     no an anticoagulation canditate due to alcohol abuse, liver cirrhosis with pancytopenia, medical noncompliance  . Diverticulosis of colon   . Portal hypertensive gastropathy   . Personal history of colonic polyps - adenomas 08/21/2013  . Helicobacter pylori gastritis 10/19/2013    Past Surgical History  Procedure Laterality Date  . Esophagogastroduodenoscopy N/A 08/21/2013    Procedure: ESOPHAGOGASTRODUODENOSCOPY (EGD);  Surgeon: Jerene Bears, MD;  Location: [facility]  ENDOSCOPY;  Service: Endoscopy;  Laterality: N/A;  . Colonoscopy N/A 08/21/2013    Procedure: COLONOSCOPY;  Surgeon: Jerene Bears, MD;  Location: [facility] ENDOSCOPY;  Service: Endoscopy;  Laterality: N/A;  . Total hip arthroplasty Right 05/20/2015    Procedure: TOTAL HIP ARTHROPLASTY;  Surgeon: Frederik Pear, MD;  Location: [place];  Service: Orthopedics;  Laterality: Right;     Current Outpatient Prescriptions  Medication Sig Dispense Refill  . aspirin EC 325 MG tablet Take 1 tablet (325 mg total) by mouth 2 (two) times daily. 30 tablet 0  . carvedilol (COREG) 12.5 MG tablet Take 1 tablet (12.5 mg total) by mouth 2 (two) times daily with a meal. 99991111 tablet 2  . folic acid (FOLVITE) 1 MG tablet Take 1 tablet (1 mg total) by mouth daily. 90 tablet 3  . furosemide (LASIX) 40 MG tablet Take 1 tablet (40 mg total) by mouth daily. 90 tablet 3  . lidocaine (LIDODERM) 5 % Place 1 patch onto the skin 2 (two) times daily as needed. Remove & Discard patch within 12 hours or as directed by MD    . losartan (COZAAR) 25 MG tablet Take 0.5 tablets (12.5 mg total) by mouth daily. 45 tablet 3  . methocarbamol (ROBAXIN) 500 MG tablet Take 1 tablet (500 mg total) by mouth 2 (two) times daily  with a meal. 60 tablet 0  . Multiple Vitamin (MULTIVITAMIN WITH MINERALS) TABS tablet Take 1 tablet by mouth daily.    Marland Kitchen oxyCODONE-acetaminophen (PERCOCET/ROXICET) 5-325 MG tablet Take 2 tablets by mouth every 4 (four) hours as needed. 10 tablet 0  . potassium chloride SA (K-DUR,KLOR-CON) 20 MEQ tablet Take 2 tablets (40 mEq total) by mouth daily. 60 tablet 5  . thiamine 100 MG tablet Take 1 tablet (100 mg total) by mouth daily. 90 tablet 3   No current facility-administered medications for this visit.    Allergies:   Review of patient's allergies indicates no known allergies.    Social History:  The patient  reports that he has never smoked. He has never used smokeless tobacco. He reports that he drinks about 4.8 oz of alcohol per  week. He reports that he uses illicit drugs (Marijuana).   Family History:  The patient's family history includes Hypertension in his father.    ROS:  Please see the history of present illness.   Otherwise, review of systems are positive for none.   All other systems are reviewed and negative.    PHYSICAL EXAM: VS:  BP 122/78 mmHg  Pulse 60  Ht 6' (1.829 m)  Wt 297 lb (134.718 kg)  BMI 40.27 kg/m2 , BMI Body mass index is 40.27 kg/(m^2). GEN: Well nourished, well developed, in no acute distress HEENT: normal Neck: no JVD, carotid bruits, or masses Cardiac: RRR; no murmurs, rubs, or gallops.  3+ edema of RLE and 1+ edema of LLE Respiratory:  clear to auscultation bilaterally, normal work of breathing GI: soft, nontender, nondistended, + BS MS: no deformity or atrophy Skin: warm and dry, no rash Neuro:  Strength and sensation are intact Psych: euthymic mood, full affect   EKG:  EKG is not ordered today.    Recent Labs: 02/25/2015: B Natriuretic Peptide 236.2*; TSH 2.197 03/01/2015: Magnesium 1.8 05/09/2015: ALT 16*; BUN 10; Creatinine, Ser 0.88; Potassium 4.5; Sodium 140 05/22/2015: Hemoglobin 8.3*; Platelets 98*    Lipid Panel No results found for: CHOL, TRIG, HDL, CHOLHDL, VLDL, LDLCALC, LDLDIRECT    Wt Readings from Last 3 Encounters:  07/02/15 297 lb (134.718 kg)  06/12/15 280 lb (127.007 kg)  05/20/15 266 lb (120.657 kg)    ASSESSMENT AND PLAN:  1. Chronic atrial fibrillation rate controlled - no an anticoagulation candidate due to thrombocytopenia, alcohol abuse and medical noncompliance - continue ASA/Coreg 2. DCM EF 30% but improved to 40-45% by echo 02/2015 on medical therapy.  Presumed secondary to alcohol. No ischemia on nuclear stress test. 3. Chronic systolic CHF - he is up 26 lbs from December. Part of this is probably due to weight gain from being sedentary after THR but does have LE edema of the left leg and marked LE edema of RLE(some of which is due  to recent orthopedic surgery).  I have instructed him to increase his Lasix to 40mg  BID.    - continue Losartan/coreg/Lasix 4. HTN - controlled - continue Losartan/BB 5. ETOH abuse 6. Moderate MR on echo 02/2015 - repeat in 1 year.   7.       Marked RLE edema secondary to recent hip replacement and dislocation.  He says that his doctor did LE venous doppler that was negative for DVT.  He has some mild LE edema of the LLE.  Will increase lasix to 40mg  BID.  Check BMET in 1 week.  He will followup with my extender in 1 week.  Current medicines are reviewed at length with the patient today.  The patient does not have concerns regarding medicines.  The following changes have been made:  no change  Labs/ tests ordered today: See above Assessment and Plan No orders of the defined types were placed in this encounter.     Disposition:   FU with me in 6 months  Signed, Sueanne Margarita, MD  07/02/2015 10:18 AM    [facility] [place], [place], [place]  [address] Phone: [phone]; Fax: [phone]

## 2015-07-02 ENCOUNTER — Encounter: Payer: Self-pay | Admitting: Cardiology

## 2015-07-02 ENCOUNTER — Ambulatory Visit (INDEPENDENT_AMBULATORY_CARE_PROVIDER_SITE_OTHER): Payer: Commercial Managed Care - HMO | Admitting: Cardiology

## 2015-07-02 VITALS — BP 122/78 | HR 60 | Ht 72.0 in | Wt 297.0 lb

## 2015-07-02 DIAGNOSIS — I5022 Chronic systolic (congestive) heart failure: Secondary | ICD-10-CM | POA: Diagnosis not present

## 2015-07-02 DIAGNOSIS — I10 Essential (primary) hypertension: Secondary | ICD-10-CM

## 2015-07-02 DIAGNOSIS — R600 Localized edema: Secondary | ICD-10-CM

## 2015-07-02 DIAGNOSIS — I482 Chronic atrial fibrillation, unspecified: Secondary | ICD-10-CM

## 2015-07-02 DIAGNOSIS — I420 Dilated cardiomyopathy: Secondary | ICD-10-CM | POA: Diagnosis not present

## 2015-07-02 DIAGNOSIS — R609 Edema, unspecified: Secondary | ICD-10-CM

## 2015-07-02 HISTORY — DX: Localized edema: R60.0

## 2015-07-02 MED ORDER — POTASSIUM CHLORIDE CRYS ER 20 MEQ PO TBCR: 40.0000 meq | EXTENDED_RELEASE_TABLET | Freq: Every day | ORAL | Status: DC

## 2015-07-02 MED ORDER — CARVEDILOL 12.5 MG PO TABS: 12.5000 mg | ORAL_TABLET | Freq: Two times a day (BID) | ORAL | Status: DC

## 2015-07-02 MED ORDER — FUROSEMIDE 40 MG PO TABS: 40.0000 mg | ORAL_TABLET | Freq: Two times a day (BID) | ORAL | Status: DC

## 2015-07-02 NOTE — Patient Instructions (Signed)
Medication Instructions:  Your physician has recommended you make the following change in your medication:  1) INCREASE LASIX to 40 mg TWICE DAILY  Labwork: Your physician recommends that you return for lab work 1 week (same day as follow up appointment).  Testing/Procedures: None  Follow-Up: Your physician recommends that you schedule a follow-up appointment in: 1 WEEK with a PA or NP.  Your physician wants you to follow-up in: 6 months with Dr. Radford Pax. You will receive a reminder letter in the mail two months in advance. If you don't receive a letter, please call our office to schedule the follow-up appointment.   Any Other Special Instructions Will Be Listed Below (If Applicable).     If you need a refill on your cardiac medications before your next appointment, please call your pharmacy.

## 2015-07-09 ENCOUNTER — Other Ambulatory Visit: Payer: Commercial Managed Care - HMO

## 2015-07-09 ENCOUNTER — Encounter: Payer: Self-pay | Admitting: Physician Assistant

## 2015-07-09 ENCOUNTER — Ambulatory Visit (INDEPENDENT_AMBULATORY_CARE_PROVIDER_SITE_OTHER): Payer: Commercial Managed Care - HMO | Admitting: Physician Assistant

## 2015-07-09 VITALS — BP 126/80 | HR 72 | Ht 72.0 in | Wt 295.8 lb

## 2015-07-09 DIAGNOSIS — R600 Localized edema: Secondary | ICD-10-CM | POA: Diagnosis not present

## 2015-07-09 DIAGNOSIS — I5023 Acute on chronic systolic (congestive) heart failure: Secondary | ICD-10-CM | POA: Diagnosis not present

## 2015-07-09 DIAGNOSIS — D649 Anemia, unspecified: Secondary | ICD-10-CM | POA: Diagnosis not present

## 2015-07-09 LAB — CBC WITH DIFFERENTIAL/PLATELET
BASOS PCT: 1 % (ref 0–1)
Basophils Absolute: 0 10*3/uL (ref 0.0–0.1)
EOS PCT: 2 % (ref 0–5)
Eosinophils Absolute: 0.1 10*3/uL (ref 0.0–0.7)
HEMATOCRIT: 33.3 % — AB (ref 39.0–52.0)
HEMOGLOBIN: 10.6 g/dL — AB (ref 13.0–17.0)
Lymphocytes Relative: 27 % (ref 12–46)
Lymphs Abs: 1.1 10*3/uL (ref 0.7–4.0)
MCH: 30.7 pg (ref 26.0–34.0)
MCHC: 31.8 g/dL (ref 30.0–36.0)
MCV: 96.5 fL (ref 78.0–100.0)
MONO ABS: 0.6 10*3/uL (ref 0.1–1.0)
MONOS PCT: 15 % — AB (ref 3–12)
MPV: 9.2 fL (ref 8.6–12.4)
NEUTROS ABS: 2.2 10*3/uL (ref 1.7–7.7)
Neutrophils Relative %: 55 % (ref 43–77)
Platelets: 186 10*3/uL (ref 150–400)
RBC: 3.45 MIL/uL — AB (ref 4.22–5.81)
RDW: 17.4 % — AB (ref 11.5–15.5)
WBC: 4 10*3/uL (ref 4.0–10.5)

## 2015-07-09 LAB — BASIC METABOLIC PANEL
BUN: 13 mg/dL (ref 7–25)
CALCIUM: 9.2 mg/dL (ref 8.6–10.3)
CO2: 29 mmol/L (ref 20–31)
Chloride: 102 mmol/L (ref 98–110)
Creat: 0.91 mg/dL (ref 0.70–1.25)
GLUCOSE: 84 mg/dL (ref 65–99)
POTASSIUM: 4.4 mmol/L (ref 3.5–5.3)
SODIUM: 140 mmol/L (ref 135–146)

## 2015-07-09 MED ORDER — FUROSEMIDE 40 MG PO TABS: ORAL_TABLET | ORAL | Status: DC

## 2015-07-09 NOTE — Patient Instructions (Addendum)
Medication Instructions:   INCREASE  FUROSEMIDE  TO 80 MG IN  AM  AND 40 M G  IN PM  FOR  5 DAYS  THEN RESUME  TO  40 MG  TWICE  DAILY  Labwork:  TODAY  BMET  CBC Testing/Procedures:  NONE Follow-Up:  Your physician recommends that you schedule a follow-up appointment in:  1 MONTH WITH HAO MENG  Any Other Special Instructions Will Be Listed Below (If Applicable).     If you need a refill on your cardiac medications before your next appointment, please call your pharmacy.    Heart Failure Prevention Plan:  Avoid salt, limit daily fluid intake to < 2 Liters Monitor weight every morning, call cardiology if weight increase by more than 3 lbs overnight or 5 lbs in a single week

## 2015-07-09 NOTE — Progress Notes (Signed)
Cardiology Office Note   Date:  07/09/2015   ID:  Thomas [name], DOB 21-Mar-1951, MRN [id number]  PCP:  Hoyt Koch, MD  Cardiologist:  Dr. Radford Pax  Chief Complaint  Patient presents with  . Follow-up    seen for Dr. Radford Pax, LE edema, acute on chronic systolic HF      History of Present Illness: Thomas [name] is a 65 y.o. male who presents for 1 week follow-up. He has a history of EtOH abuse, atrial fibrillation with RVR, pancytopenia secondary to alcoholism, chronic systolic heart failure, and a history of dilated cardiomyopathy secondary to alcohol abuse. He had a Myoview on 08/23/2013 which showed EF 36% with diffuse hypokinesis, suspect nonischemic cardio myopathy, no evidence of ischemia or infarction. Last echo on 02/27/2015 showed EF 40-45%, moderate MR, PA peak pressure 35 mmHg. He was felt not to be a good candidate for an echo question due to his alcoholism and medical noncompliance. Previous CT scan showed alcoholic liver disease with evidence of cirrhosis. He had recent total hip replacement in January followed by a hip dislocation in Feb. He was last seen in office on 07/02/2015, he was noted to have marked right lower extremity edema secondary to recent hip replacement dislocation. He says while his doctor has performed a lower extremity venous Doppler that was negative for DVT. (although I do not see this venous doppler in EPIC, per patient, this was just done in Feb) His Lasix was increased to 40 mg twice a day. He is here today for 1 week follow-up.   Since increasing his Lasix, he has noticed increased urinary output, he has lost roughly 2 pounds. His lower extremity edema has significantly improved. According to the patient, he always have some degree of left ankle edema, it was his right lower extremity edema that has been concerning for him. Looking back, his weight has increased from 271 pounds in December 16 up to 297 pounds in March. The 20 pounds increase could  not be explained by hip prosthesis alone, I think the majority of it is likely fluid overload. I will attempt to be more aggressive on diuresing him, I will increase his Lasix to 80 mg in the morning, and 40 mg in the afternoon for the next 5 days before returning back to 40 mg twice a day. I will see him back in one month, if his edema remaining significant and his weight has not significantly came down, I will permanently increase his Lasix to a higher dose.    Past Medical History  Diagnosis Date  . Obesity   . Arthritis   . Hypertension   . Chronic systolic CHF (congestive heart failure), NYHA class 2 ([place])   . DCM (dilated cardiomyopathy) ([place])     EF 30% but now improved to 40-45% by echo 2016 on medical therapy with moderate MR  . Pancytopenia ([place])   . ETOH abuse   . Alcoholic liver disease ([place])   . Gastroduodenitis     H Pylori positive  . Chronic atrial fibrillation ([name])     no an anticoagulation canditate due to alcohol abuse, liver cirrhosis with pancytopenia, medical noncompliance  . Diverticulosis of colon   . Portal hypertensive gastropathy   . Personal history of colonic polyps - adenomas 08/21/2013  . Helicobacter pylori gastritis 10/19/2013  . Edema extremities 07/02/2015    Past Surgical History  Procedure Laterality Date  . Esophagogastroduodenoscopy N/A 08/21/2013    Procedure: ESOPHAGOGASTRODUODENOSCOPY (EGD);  Surgeon: Lajuan Lines  Pyrtle, MD;  Location: [place];  Service: Endoscopy;  Laterality: N/A;  . Colonoscopy N/A 08/21/2013    Procedure: COLONOSCOPY;  Surgeon: Jerene Bears, MD;  Location[facility] ENDOSCOPY;  Service: Endoscopy;  Laterality: N/A;  . Total hip arthroplasty Right 05/20/2015    Procedure: TOTAL HIP ARTHROPLASTY;  Surgeon: Frederik Pear, MD;  Location: [place];  Service: Orthopedics;  Laterality: Right;     Current Outpatient Prescriptions  Medication Sig Dispense Refill  . aspirin EC 325 MG tablet Take 1 tablet (325 mg total) by mouth 2 (two) times  daily. 30 tablet 0  . carvedilol (COREG) 12.5 MG tablet Take 1 tablet (12.5 mg total) by mouth 2 (two) times daily with a meal. 99991111 tablet 3  . folic acid (FOLVITE) 1 MG tablet Take 1 tablet (1 mg total) by mouth daily. 90 tablet 3  . furosemide (LASIX) 40 MG tablet 80 MG  IN AM  AND  40 MG  IN PM FOR  5 DAYS  THEN RESUME  40 MG  TWICE  DAILY 60 tablet 11  . lidocaine (LIDODERM) 5 % Place 1 patch onto the skin 2 (two) times daily as needed. Remove & Discard patch within 12 hours or as directed by MD    . losartan (COZAAR) 25 MG tablet Take 0.5 tablets (12.5 mg total) by mouth daily. 45 tablet 3  . methocarbamol (ROBAXIN) 500 MG tablet Take 1 tablet (500 mg total) by mouth 2 (two) times daily with a meal. 60 tablet 0  . Multiple Vitamin (MULTIVITAMIN WITH MINERALS) TABS tablet Take 1 tablet by mouth daily.    Marland Kitchen oxyCODONE-acetaminophen (PERCOCET/ROXICET) 5-325 MG tablet Take 2 tablets by mouth every 4 (four) hours as needed. 10 tablet 0  . potassium chloride SA (K-DUR,KLOR-CON) 20 MEQ tablet Take 2 tablets (40 mEq total) by mouth daily. 180 tablet 3  . thiamine 100 MG tablet Take 1 tablet (100 mg total) by mouth daily. 90 tablet 3   No current facility-administered medications for this visit.    Allergies:   Review of patient's allergies indicates no known allergies.    Social History:  The patient  reports that he has never smoked. He has never used smokeless tobacco. He reports that he drinks about 4.8 oz of alcohol per week. He reports that he uses illicit drugs (Marijuana).   Family History:  The patient's family history includes Hypertension in his father.    ROS:  Please see the history of present illness.   Otherwise, review of systems are positive for LE edema.   All other systems are reviewed and negative.    PHYSICAL EXAM: VS:  BP 126/80 mmHg  Pulse 72  Ht 6' (1.829 m)  Wt 295 lb 12.8 oz (134.174 kg)  BMI 40.11 kg/m2  SpO2 98% , BMI Body mass index is 40.11 kg/(m^2). GEN:  Well nourished, well developed, in no acute distress HEENT: normal Neck: no JVD, carotid bruits, or masses Cardiac: RRR; no rubs, or gallops,no edema. 1/6 systolic murmur Respiratory:  clear to auscultation bilaterally, normal work of breathing GI: soft, nontender, nondistended, + BS MS: no deformity or atrophy Skin: warm and dry, no rash Neuro:  Strength and sensation are intact Psych: euthymic mood, full affect   EKG:  EKG is not ordered today.    Recent Labs: 02/25/2015: B Natriuretic Peptide 236.2*; TSH 2.197 03/01/2015: Magnesium 1.8 05/09/2015: ALT 16* 07/09/2015: BUN 13; Creat 0.91; Hemoglobin 10.6*; Platelets 186; Potassium 4.4; Sodium 140    Lipid  Panel No results found for: CHOL, TRIG, HDL, CHOLHDL, VLDL, LDLCALC, LDLDIRECT    Wt Readings from Last 3 Encounters:  07/09/15 295 lb 12.8 oz (134.174 kg)  07/02/15 297 lb (134.718 kg)  06/12/15 280 lb (127.007 kg)      Other studies Reviewed: Additional studies/ records that were reviewed today include:   Myoview 08/23/2013 IMPRESSION: 1. No evidence for ischemia or infarction.  2. EF 36% with diffuse hypokinesis, suspect nonischemic Cardiomyopathy   Echo 02/27/2015 LV EF: 40% - 45%  ------------------------------------------------------------------- Indications: Atrial fibrillation - 427.31.  ------------------------------------------------------------------- History: PMH: Hypotension. Congestive heart failure. Dilated cardiomyopathy.  ------------------------------------------------------------------- Study Conclusions  - Left ventricle: The cavity size was normal. Wall thickness was  normal. Systolic function was mildly to moderately reduced. The  estimated ejection fraction was in the range of 40% to 45%.  Diffuse hypokinesis. - Mitral valve: There was moderate regurgitation. - Left atrium: The atrium was mildly dilated. - Right atrium: The atrium was mildly dilated. - Pulmonary  arteries: PA peak pressure: 35 mm Hg (S).      Review of the above records demonstrates:   20 lbs weight gain recently. H/o EtOH induced cardiomyopathy with EF 40-45%   ASSESSMENT AND PLAN:  1.  Acute on chronic systolic heart failure  - he is still very much fluid overloaded, his RLE has 2-3+ pitting edema, L ankle also has 1-2+ pitting edema. He did lose about 2 pounds since starting on a higher dose of lasix, however it has been slow to lose weight, I will be more aggressive on diuresing him, increase lasix to 80mg  AM/40mg  PM for 5 days, then return to 40mg  BID. Hopefully this 40mg  BID will be his maintenance dose, however if he does not appear euvolemic in 1 month, I will permanently increase his lasix dose. Check CBC given his h/o anemia, and BMET after lasix increased  2. PAF: check EKG on next visit, he is on high dose ASA  4. pancytopenia secondary to alcoholism  5. history of dilated cardiomyopathy secondary to alcohol abuse   Current medicines are reviewed at length with the patient today.  The patient does not have concerns regarding medicines.  The following changes have been made:  Increase lasix dose  Labs/ tests ordered today include:   Orders Placed This Encounter  Procedures  . CBC with Differential/Platelet  . Basic metabolic panel     Disposition:   FU with me in 1 month  Signed, Almyra Deforest, [place]  07/09/2015 10:12 PM    [facility] [place], [place], [place]  [address] Phone: [phone]; Fax: [phone]

## 2015-07-30 ENCOUNTER — Other Ambulatory Visit: Payer: Self-pay | Admitting: *Deleted

## 2015-07-30 MED ORDER — LOSARTAN POTASSIUM 25 MG PO TABS: 12.5000 mg | ORAL_TABLET | Freq: Every day | ORAL | Status: DC

## 2015-07-30 MED ORDER — FOLIC ACID 1 MG PO TABS: 1.0000 mg | ORAL_TABLET | Freq: Every day | ORAL | Status: DC

## 2015-07-30 MED ORDER — THIAMINE HCL 100 MG PO TABS: 100.0000 mg | ORAL_TABLET | Freq: Every day | ORAL | Status: DC

## 2015-07-31 ENCOUNTER — Telehealth: Payer: Self-pay | Admitting: Internal Medicine

## 2015-07-31 MED ORDER — THIAMINE HCL 100 MG PO TABS: 100.0000 mg | ORAL_TABLET | Freq: Every day | ORAL | Status: DC

## 2015-07-31 MED ORDER — FOLIC ACID 1 MG PO TABS: 1.0000 mg | ORAL_TABLET | Freq: Every day | ORAL | Status: DC

## 2015-07-31 MED ORDER — LOSARTAN POTASSIUM 25 MG PO TABS: 12.5000 mg | ORAL_TABLET | Freq: Every day | ORAL | Status: DC

## 2015-07-31 NOTE — Telephone Encounter (Signed)
Per chart confirmation from pharmacy received @ 4:00 pm. Will resend electronically AGAIN!!.../lmb

## 2015-07-31 NOTE — Telephone Encounter (Signed)
Patient called to advise that [facility] has not gotten any of the three scripts sent in yesterday. Please resend at the patients request

## 2015-08-01 NOTE — Telephone Encounter (Signed)
Notified pt w/MD response. He states he is ok for right now not having any pain. When he come in for his f/u appt if he still need will discuss then at that visit....Thomas [name]

## 2015-08-01 NOTE — Telephone Encounter (Signed)
Pt stated that rx we sent in [place] will not release to him because he said Dr. Sharlet Salina has to give autherization ( pt is unsure of the reason). Please follow with this, he really need his medication.

## 2015-08-01 NOTE — Telephone Encounter (Signed)
Would need visit as we have not prescribed. If related to hip pain recommend he call his orthopedic for refill.

## 2015-08-01 NOTE — Telephone Encounter (Signed)
Called [facility] to see what exactly the problem. Spoke with rep Velia Meyer she stated that the three rx that we refilled Losartan, folic Acid, and thiamine was filled and picked up on 07/30/15. They have no other medications waiting for pt. Called pt to verify what he is needing. Inform him the three medication that was rx by Dr. Sharlet Salina he has picked up. Pt states he is wanting refill on the Oxycodone & Robaxin as well. Med was rx when he had his hip surgery. Pls advise...Thomas [name]

## 2015-08-09 ENCOUNTER — Ambulatory Visit: Payer: Commercial Managed Care - HMO | Admitting: Physician Assistant

## 2015-08-10 ENCOUNTER — Emergency Department (HOSPITAL_COMMUNITY): Payer: Commercial Managed Care - HMO

## 2015-08-10 ENCOUNTER — Emergency Department (HOSPITAL_COMMUNITY)
Admission: EM | Admit: 2015-08-10 | Discharge: 2015-08-10 | Disposition: A | Discharge status: Home / Self Care | Payer: Commercial Managed Care - HMO | Attending: Emergency Medicine | Admitting: Emergency Medicine

## 2015-08-10 ENCOUNTER — Encounter (HOSPITAL_COMMUNITY): Payer: Self-pay | Admitting: Emergency Medicine

## 2015-08-10 DIAGNOSIS — Z7982 Long term (current) use of aspirin: Secondary | ICD-10-CM | POA: Diagnosis not present

## 2015-08-10 DIAGNOSIS — E669 Obesity, unspecified: Secondary | ICD-10-CM | POA: Diagnosis not present

## 2015-08-10 DIAGNOSIS — M1990 Unspecified osteoarthritis, unspecified site: Secondary | ICD-10-CM | POA: Diagnosis not present

## 2015-08-10 DIAGNOSIS — S79911A Unspecified injury of right hip, initial encounter: Secondary | ICD-10-CM | POA: Diagnosis present

## 2015-08-10 DIAGNOSIS — S73004A Unspecified dislocation of right hip, initial encounter: Secondary | ICD-10-CM

## 2015-08-10 DIAGNOSIS — Z8719 Personal history of other diseases of the digestive system: Secondary | ICD-10-CM | POA: Diagnosis not present

## 2015-08-10 DIAGNOSIS — I482 Chronic atrial fibrillation: Secondary | ICD-10-CM | POA: Diagnosis not present

## 2015-08-10 DIAGNOSIS — I5022 Chronic systolic (congestive) heart failure: Secondary | ICD-10-CM | POA: Insufficient documentation

## 2015-08-10 DIAGNOSIS — Z79899 Other long term (current) drug therapy: Secondary | ICD-10-CM | POA: Diagnosis not present

## 2015-08-10 DIAGNOSIS — Y9389 Activity, other specified: Secondary | ICD-10-CM | POA: Diagnosis not present

## 2015-08-10 DIAGNOSIS — Y9289 Other specified places as the place of occurrence of the external cause: Secondary | ICD-10-CM | POA: Diagnosis not present

## 2015-08-10 DIAGNOSIS — Z9889 Other specified postprocedural states: Secondary | ICD-10-CM | POA: Diagnosis not present

## 2015-08-10 DIAGNOSIS — T84020A Dislocation of internal right hip prosthesis, initial encounter: Secondary | ICD-10-CM | POA: Diagnosis not present

## 2015-08-10 DIAGNOSIS — W010XXA Fall on same level from slipping, tripping and stumbling without subsequent striking against object, initial encounter: Secondary | ICD-10-CM | POA: Diagnosis not present

## 2015-08-10 DIAGNOSIS — Y998 Other external cause status: Secondary | ICD-10-CM | POA: Diagnosis not present

## 2015-08-10 DIAGNOSIS — I10 Essential (primary) hypertension: Secondary | ICD-10-CM | POA: Insufficient documentation

## 2015-08-10 DIAGNOSIS — Z8619 Personal history of other infectious and parasitic diseases: Secondary | ICD-10-CM | POA: Insufficient documentation

## 2015-08-10 DIAGNOSIS — Z862 Personal history of diseases of the blood and blood-forming organs and certain disorders involving the immune mechanism: Secondary | ICD-10-CM | POA: Diagnosis not present

## 2015-08-10 DIAGNOSIS — Y658 Other specified misadventures during surgical and medical care: Secondary | ICD-10-CM | POA: Diagnosis not present

## 2015-08-10 MED ORDER — PROPOFOL 10 MG/ML IV BOLUS
0.5000 mg/kg | Freq: Once | INTRAVENOUS | Status: AC
  Administered 2015-08-10: 50 mg via INTRAVENOUS
  Filled 2015-08-10: qty 20

## 2015-08-10 MED ORDER — SODIUM CHLORIDE 0.9 % IV SOLN
Freq: Once | INTRAVENOUS | Status: AC
  Administered 2015-08-10: 20:00:00 via INTRAVENOUS

## 2015-08-10 MED ORDER — PROPOFOL 10 MG/ML IV BOLUS
INTRAVENOUS | Status: AC
  Filled 2015-08-10: qty 20

## 2015-08-10 NOTE — Sedation Documentation (Signed)
Propofol 20mg  IVP by Dr. Ralene Ok

## 2015-08-10 NOTE — ED Notes (Signed)
Pt. Transported to xray at this time.  

## 2015-08-10 NOTE — Sedation Documentation (Signed)
Propofol 30mg  by Dr. Willy Eddy

## 2015-08-10 NOTE — Sedation Documentation (Signed)
Propofol 30mg  IVP by Dr. Louis Meckel

## 2015-08-10 NOTE — ED Notes (Signed)
Pt here from home with c/o right sided hip pain after a fall today  , pt had a hip replacement this past  Feb , pt  Unable to walk on affected leg

## 2015-08-10 NOTE — Sedation Documentation (Signed)
30mg  of Propofol by Dr. Willy Eddy

## 2015-08-10 NOTE — ED Provider Notes (Signed)
CSN: [id number]     Arrival date & time 08/10/15  1505 History   First MD Initiated Contact with Patient 08/10/15 [phone]     Chief Complaint  Patient presents with  . Hip Pain     (Consider location/radiation/quality/duration/timing/severity/associated sxs/prior Treatment) HPI Comments: 65 y.o. Male with HTN, atrial fibrillation, dilated cardiomyopathy presents with right hip pain.  The patient states that 2-3 hours before presentation he slipped and has been having pain in his hip since that time.  He says that he was getting up while wearing compression stockings and the stocking foot on the right slipped out from under him.  The patient had a Total hip arthroplasty done in January of 2017.  He reports that pain is only on ROM of the hip.    Patient is a 65 y.o. male presenting with hip pain.  Hip Pain Pertinent negatives include no chest pain, no abdominal pain and no headaches.    Past Medical History  Diagnosis Date  . Obesity   . Arthritis   . Hypertension   . Chronic systolic CHF (congestive heart failure), NYHA class 2 ([place])   . DCM (dilated cardiomyopathy) ([place])     EF 30% but now improved to 40-45% by echo 2016 on medical therapy with moderate MR  . Pancytopenia ([place])   . ETOH abuse   . Alcoholic liver disease ([place])   . Gastroduodenitis     H Pylori positive  . Chronic atrial fibrillation ([name])     no an anticoagulation canditate due to alcohol abuse, liver cirrhosis with pancytopenia, medical noncompliance  . Diverticulosis of colon   . Portal hypertensive gastropathy   . Personal history of colonic polyps - adenomas 08/21/2013  . Helicobacter pylori gastritis 10/19/2013  . Edema extremities 07/02/2015   Past Surgical History  Procedure Laterality Date  . Esophagogastroduodenoscopy N/A 08/21/2013    Procedure: ESOPHAGOGASTRODUODENOSCOPY (EGD);  Surgeon: Jerene Bears, MD;  Location: [facility] ENDOSCOPY;  Service: Endoscopy;  Laterality: N/A;  . Colonoscopy N/A 08/21/2013   Procedure: COLONOSCOPY;  Surgeon: Jerene Bears, MD;  Location: [facility] ENDOSCOPY;  Service: Endoscopy;  Laterality: N/A;  . Total hip arthroplasty Right 05/20/2015    Procedure: TOTAL HIP ARTHROPLASTY;  Surgeon: Frederik Pear, MD;  Location: [place];  Service: Orthopedics;  Laterality: Right;   Family History  Problem Relation Age of Onset  . Hypertension Father    Social History  Substance Use Topics  . Smoking status: Never Smoker   . Smokeless tobacco: Never Used  . Alcohol Use: 4.8 oz/week    8 Cans of beer per week     Comment: former ETOH abuse     Review of Systems  Constitutional: Negative for fever, chills and fatigue.  HENT: Negative for congestion and sinus pressure.   Respiratory: Negative for cough and chest tightness.   Cardiovascular: Negative for chest pain and palpitations.  Gastrointestinal: Negative for nausea, vomiting, abdominal pain and diarrhea.  Genitourinary: Negative for dysuria, urgency and hematuria.  Musculoskeletal: Positive for arthralgias (right hip pain). Negative for myalgias, back pain and neck pain.  Skin: Negative for rash and wound.  Neurological: Negative for dizziness, weakness and headaches.  Hematological: Does not bruise/bleed easily.      Allergies  Review of patient's allergies indicates no known allergies.  Home Medications   Prior to Admission medications   Medication Sig Start Date End Date Taking? Authorizing Provider  aspirin EC 325 MG tablet Take 1 tablet (325 mg total) by mouth 2 (  two) times daily. 05/20/15  Yes Leighton Parody, PA-C  carvedilol (COREG) 12.5 MG tablet Take 1 tablet (12.5 mg total) by mouth 2 (two) times daily with a meal. 07/02/15  Yes Sueanne Margarita, MD  folic acid (FOLVITE) 1 MG tablet Take 1 tablet (1 mg total) by mouth daily. 07/31/15  Yes Hoyt Koch, MD  furosemide (LASIX) 40 MG tablet 80 MG  IN AM  AND  40 MG  IN PM FOR  5 DAYS  THEN RESUME  40 MG  TWICE  DAILY Patient taking differently: Take 40 mg by  mouth daily.  07/09/15  Yes Almyra Deforest, PA  ibuprofen (ADVIL,MOTRIN) 400 MG tablet Take 400 mg by mouth every 6 (six) hours as needed for moderate pain.   Yes Historical Provider, MD  lidocaine (LIDODERM) 5 % Place 1 patch onto the skin 2 (two) times daily as needed. Remove & Discard patch within 12 hours or as directed by MD   Yes Historical Provider, MD  losartan (COZAAR) 25 MG tablet Take 0.5 tablets (12.5 mg total) by mouth daily. 07/31/15  Yes Hoyt Koch, MD  methocarbamol (ROBAXIN) 500 MG tablet Take 1 tablet (500 mg total) by mouth 2 (two) times daily with a meal. 05/20/15  Yes Leighton Parody, PA-C  Multiple Vitamin (MULTIVITAMIN WITH MINERALS) TABS tablet Take 1 tablet by mouth daily. 08/24/13  Yes Geradine Girt, DO  oxyCODONE-acetaminophen (PERCOCET/ROXICET) 5-325 MG tablet Take 2 tablets by mouth every 4 (four) hours as needed. Patient taking differently: Take 2 tablets by mouth every 4 (four) hours as needed for moderate pain.  06/12/15  Yes Tanna Furry, MD  potassium chloride SA (K-DUR,KLOR-CON) 20 MEQ tablet Take 2 tablets (40 mEq total) by mouth daily. 07/02/15  Yes Sueanne Margarita, MD  thiamine 100 MG tablet Take 1 tablet (100 mg total) by mouth daily. 07/31/15  Yes Hoyt Koch, MD  traMADol (ULTRAM) 50 MG tablet Take 50 mg by mouth 2 (two) times daily as needed for moderate pain.  07/04/15  Yes Historical Provider, MD   There were no vitals taken for this visit. Physical Exam  Constitutional: He is oriented to person, place, and time. He appears well-developed and well-nourished. No distress.  HENT:  Head: Normocephalic and atraumatic.  Right Ear: External ear normal.  Left Ear: External ear normal.  Mouth/Throat: Oropharynx is clear and moist. No oropharyngeal exudate.  Eyes: EOM are normal. Pupils are equal, round, and reactive to light.  Neck: Normal range of motion. Neck supple.  Cardiovascular: Normal rate and intact distal pulses.   Pulmonary/Chest: Effort normal.  No respiratory distress. He has no wheezes. He has no rales.  Abdominal: Soft. He exhibits no distension. There is no tenderness.  Musculoskeletal: He exhibits no edema.       Right hip: He exhibits decreased range of motion, tenderness and deformity (Displaced femoral head palpated on exam). He exhibits no bony tenderness and no swelling.  Neurological: He is alert and oriented to person, place, and time. No sensory deficit. He exhibits normal muscle tone.  Skin: Skin is warm and dry. No rash noted. He is not diaphoretic.  Vitals reviewed.   ED Course  .Sedation Date/Time: 08/11/2015 2:51 AM Performed by: Lonia Skinner ROE Authorized by: Harvel Quale  Consent:    Consent obtained:  Verbal and written   Consent given by:  Patient   Risks discussed:  Allergic reaction, prolonged hypoxia resulting in organ damage, prolonged sedation necessitating reversal  and respiratory compromise necessitating ventilatory assistance and intubation Indications:    Sedation purpose:  Dislocation reduction   Procedure necessitating sedation performed by:  Different physician (Performed by resident who I was over seen)   Intended level of sedation:  Moderate (conscious sedation) Pre-sedation assessment:    Time since last food or drink:  Greater than 6 hours   ASA classification: class 3 - patient with severe systemic disease     Neck mobility: normal     Mouth opening:  2 finger widths   Pre-sedation assessments completed and reviewed: airway patency, cardiovascular function, hydration status, mental status, nausea/vomiting, pain level and respiratory function   Immediate pre-procedure details:    Reviewed: vital signs and NPO status     Verified: bag valve mask available, emergency equipment available, IV patency confirmed, oxygen available and suction available   Procedure details (see MAR for exact dosages):    Preoxygenation:  Nasal cannula   Sedation:  Propofol   Intra-procedure monitoring:   Blood pressure monitoring, cardiac monitor, continuous capnometry and continuous pulse oximetry   Intra-procedure events: none   Post-procedure details:    Attendance: Constant attendance by certified staff until patient recovered     Recovery: Patient returned to pre-procedure baseline     Post-sedation assessments completed and reviewed: airway patency, cardiovascular function, hydration status, mental status, nausea/vomiting, pain level and respiratory function     Patient is stable for discharge or admission: Yes     Patient tolerance:  Tolerated well, no immediate complications Reduction of dislocation Date/Time: 08/11/2015 2:54 AM Performed by: Lonia Skinner ROE Authorized by: Harvel Quale Consent: Verbal consent obtained. Written consent obtained. Risks and benefits: risks, benefits and alternatives were discussed Consent given by: patient Patient understanding: patient states understanding of the procedure being performed Patient consent: the patient's understanding of the procedure matches consent given Required items: required blood products, implants, devices, and special equipment available Patient identity confirmed: verbally with patient Time out: Immediately prior to procedure a "time out" was called to verify the correct patient, procedure, equipment, support staff and site/side marked as required. Local anesthesia used: no Patient sedated: yes Sedatives: propofol Patient tolerance: Patient tolerated the procedure well with no immediate complications Comments: Right hip dislocation successfully reduced.   (including critical care time) Labs Review Labs Reviewed - No data to display  Imaging Review No results found. I have personally reviewed and evaluated these images and lab results as part of my medical decision-making.   EKG Interpretation None      MDM  Patient was seen and evaluated in stable condition. Patient neurovascularly intact. X-ray confirmed  right hip dislocation. Patient was sedated with propofol and hip was relocated successfully. Follow-up x-rays confirmed placement. Patient without any fracture. Patient was placed in a knee immobilizer. He was discharged home to follow-up with orthopedics outpatient. Final diagnoses:  None    1. Right hip dislocation    Harvel Quale, MD 08/11/15 [phone]

## 2015-08-10 NOTE — Discharge Instructions (Signed)
You were seen and evaluated today for your right hip dislocation. The hip is now back in place. Keep your knee immobilizer on. Follow-up with Dr. Mayer Camel in the office.  Hip Dislocation Hip dislocation is the displacement of the "ball" at the head of your thigh bone (femur) from its socket in the hip bone (pelvis). The ball-and-socket structure of the hip joint gives it a lot of stability, while allowing it to move freely. Therefore, a lot of force is required to displace the femur from its socket. A hip dislocation is an emergency. If you believe you have dislocated your hip and cannot move your leg, call for help immediately. Do not try to move. CAUSES The most common cause of hip dislocation is motor vehicle accidents. However, force from falls from a height (a ladder or building), injuries from contact sports, or injuries from industrial accidents can be enough to dislocate your hip. SYMPTOMS A hip dislocation is very painful. If you have a dislocated hip, you will not be able to move your hip. If you have nerve damage, you may not have feeling in your lower leg, foot, or ankle.  DIAGNOSIS Usually, your caregiver can diagnose a hip dislocation by looking at the position of your leg. Generally, X-ray exams are done to check for fractures in your femur or pelvis. The leg of the dislocated hip will appear shorter than the other leg, and your foot will be turned inward. TREATMENT  Your caregiver can manipulate your bones back into the joint (reduction). If there are no other complications involved with your dislocation, such as fractures or damage to blood vessels or nerves, this procedure can be done without surgery. Before this procedure, you will be given medicine so that you will not feel pain (anesthetic). Often specialized imaging exams are done after the reduction (magnetic resonance imaging [MRI] or computed tomography [CT]) to check for loose pieces of cartilage or bone in the joint. If a manual  reduction fails or you have nerve damage, damage to your blood vessels, or bone fractures, surgery will be necessary to perform the reduction.  HOME CARE INSTRUCTIONS The following measures can help to reduce pain and speed up the healing process:  Rest your injured joint. Do not move your joint if it is painful. Also, avoid activities similar to the one that caused your injury.  Apply ice to your injured joint for 1 to 2 days after your reduction or as directed by your caregiver. Applying ice helps to reduce inflammation and pain.  Put ice in a plastic bag.  Place a towel between your skin and the bag.  Leave the ice on for 15 to 20 minutes at a time, every 2 hours while you are awake.  Use crutches or a walker as directed by your caregiver.  Exercise your hip and leg as directed by your caregiver.  Take over-the-counter or prescription medicine for pain as directed by your caregiver. SEEK IMMEDIATE MEDICAL CARE IF:  Your pain becomes worse rather than better.  You feel like your hip has become dislocated again. MAKE SURE YOU:  Understand these instructions.  Will watch your condition.  Will get help right away if you are not doing well or get worse.   This information is not intended to replace advice given to you by your health care provider. Make sure you discuss any questions you have with your health care provider.   Document Released: 12/30/2000 Document Revised: 04/27/2014 Document Reviewed: 11/06/2014 Elsevier Interactive Patient Education  Education ©2016 [employer] Inc. ° °

## 2015-08-10 NOTE — Sedation Documentation (Signed)
Right  Hip reduced by dr. Alfonse Spruce.

## 2015-08-12 ENCOUNTER — Emergency Department (HOSPITAL_COMMUNITY): Payer: Commercial Managed Care - HMO

## 2015-08-12 ENCOUNTER — Observation Stay (HOSPITAL_COMMUNITY)
Admission: EM | Admit: 2015-08-12 | Discharge: 2015-08-14 | Disposition: A | Discharge status: Home / Self Care | Payer: Commercial Managed Care - HMO | Attending: Internal Medicine | Admitting: Internal Medicine

## 2015-08-12 ENCOUNTER — Encounter (HOSPITAL_COMMUNITY): Payer: Self-pay

## 2015-08-12 DIAGNOSIS — N179 Acute kidney failure, unspecified: Secondary | ICD-10-CM | POA: Diagnosis present

## 2015-08-12 DIAGNOSIS — Z96649 Presence of unspecified artificial hip joint: Secondary | ICD-10-CM | POA: Insufficient documentation

## 2015-08-12 DIAGNOSIS — I5022 Chronic systolic (congestive) heart failure: Secondary | ICD-10-CM | POA: Insufficient documentation

## 2015-08-12 DIAGNOSIS — Y838 Other surgical procedures as the cause of abnormal reaction of the patient, or of later complication, without mention of misadventure at the time of the procedure: Secondary | ICD-10-CM | POA: Insufficient documentation

## 2015-08-12 DIAGNOSIS — M24459 Recurrent dislocation, unspecified hip: Secondary | ICD-10-CM | POA: Diagnosis present

## 2015-08-12 DIAGNOSIS — I110 Hypertensive heart disease with heart failure: Secondary | ICD-10-CM | POA: Diagnosis not present

## 2015-08-12 DIAGNOSIS — I482 Chronic atrial fibrillation, unspecified: Secondary | ICD-10-CM | POA: Diagnosis present

## 2015-08-12 DIAGNOSIS — Z96641 Presence of right artificial hip joint: Secondary | ICD-10-CM | POA: Insufficient documentation

## 2015-08-12 DIAGNOSIS — F1010 Alcohol abuse, uncomplicated: Secondary | ICD-10-CM | POA: Diagnosis present

## 2015-08-12 DIAGNOSIS — T84029A Dislocation of unspecified internal joint prosthesis, initial encounter: Secondary | ICD-10-CM | POA: Diagnosis present

## 2015-08-12 DIAGNOSIS — T84020A Dislocation of internal right hip prosthesis, initial encounter: Secondary | ICD-10-CM | POA: Diagnosis not present

## 2015-08-12 DIAGNOSIS — D61818 Other pancytopenia: Secondary | ICD-10-CM | POA: Diagnosis not present

## 2015-08-12 DIAGNOSIS — K709 Alcoholic liver disease, unspecified: Secondary | ICD-10-CM | POA: Diagnosis not present

## 2015-08-12 DIAGNOSIS — W010XXA Fall on same level from slipping, tripping and stumbling without subsequent striking against object, initial encounter: Secondary | ICD-10-CM | POA: Diagnosis not present

## 2015-08-12 DIAGNOSIS — S73004A Unspecified dislocation of right hip, initial encounter: Secondary | ICD-10-CM

## 2015-08-12 DIAGNOSIS — M24451 Recurrent dislocation, right hip: Secondary | ICD-10-CM | POA: Diagnosis not present

## 2015-08-12 DIAGNOSIS — Z7982 Long term (current) use of aspirin: Secondary | ICD-10-CM | POA: Insufficient documentation

## 2015-08-12 LAB — CBC WITH DIFFERENTIAL/PLATELET
Basophils Absolute: 0 10*3/uL (ref 0.0–0.1)
Basophils Relative: 0 %
Eosinophils Absolute: 0.1 10*3/uL (ref 0.0–0.7)
Eosinophils Relative: 1 %
HEMATOCRIT: 26.7 % — AB (ref 39.0–52.0)
HEMOGLOBIN: 8.8 g/dL — AB (ref 13.0–17.0)
LYMPHS ABS: 1.3 10*3/uL (ref 0.7–4.0)
LYMPHS PCT: 13 %
MCH: 31.7 pg (ref 26.0–34.0)
MCHC: 33 g/dL (ref 30.0–36.0)
MCV: 96 fL (ref 78.0–100.0)
MONOS PCT: 15 %
Monocytes Absolute: 1.5 10*3/uL — ABNORMAL HIGH (ref 0.1–1.0)
NEUTROS ABS: 7.2 10*3/uL (ref 1.7–7.7)
NEUTROS PCT: 71 %
Platelets: 110 10*3/uL — ABNORMAL LOW (ref 150–400)
RBC: 2.78 MIL/uL — ABNORMAL LOW (ref 4.22–5.81)
RDW: 15.7 % — ABNORMAL HIGH (ref 11.5–15.5)
WBC: 10.2 10*3/uL (ref 4.0–10.5)

## 2015-08-12 LAB — BASIC METABOLIC PANEL
Anion gap: 10 (ref 5–15)
BUN: 18 mg/dL (ref 6–20)
CHLORIDE: 101 mmol/L (ref 101–111)
CO2: 26 mmol/L (ref 22–32)
CREATININE: 1.26 mg/dL — AB (ref 0.61–1.24)
Calcium: 8.7 mg/dL — ABNORMAL LOW (ref 8.9–10.3)
GFR calc Af Amer: >60 mL/min (ref >60)
GFR calc non Af Amer: 59 mL/min — ABNORMAL LOW (ref >60)
Glucose, Bld: 101 mg/dL — ABNORMAL HIGH (ref 65–99)
Potassium: 3.9 mmol/L (ref 3.5–5.1)
Sodium: 137 mmol/L (ref 135–145)

## 2015-08-12 MED ORDER — PROPOFOL 10 MG/ML IV BOLUS
INTRAVENOUS | Status: AC | PRN
  Administered 2015-08-12 (×2): 30 mg via INTRAVENOUS
  Administered 2015-08-12: 65 mg via INTRAVENOUS

## 2015-08-12 MED ORDER — NAPROXEN 500 MG PO TABS: 500.0000 mg | ORAL_TABLET | Freq: Two times a day (BID) | ORAL | Status: DC

## 2015-08-12 MED ORDER — HYDROCODONE-ACETAMINOPHEN 5-325 MG PO TABS: 1.0000 | ORAL_TABLET | ORAL | Status: AC | PRN

## 2015-08-12 MED ORDER — PROPOFOL 10 MG/ML IV BOLUS: 1.0000 mg/kg | Freq: Once | INTRAVENOUS | Status: DC

## 2015-08-12 MED ORDER — HYDROCODONE-ACETAMINOPHEN 5-325 MG PO TABS: 1.0000 | ORAL_TABLET | ORAL | Status: DC | PRN

## 2015-08-12 MED ORDER — PROPOFOL 10 MG/ML IV BOLUS
120.0000 mg | Freq: Once | INTRAVENOUS | Status: AC
  Administered 2015-08-12: 120 mg via INTRAVENOUS
  Filled 2015-08-12: qty 20

## 2015-08-12 MED ORDER — SODIUM CHLORIDE 0.9 % IV BOLUS (SEPSIS)
1000.0000 mL | Freq: Once | INTRAVENOUS | Status: AC
  Administered 2015-08-13: 1000 mL via INTRAVENOUS

## 2015-08-12 MED ORDER — ONDANSETRON HCL 4 MG/2ML IJ SOLN: 4.0000 mg | Freq: Three times a day (TID) | INTRAMUSCULAR | Status: AC | PRN

## 2015-08-12 MED ORDER — MORPHINE SULFATE (PF) 4 MG/ML IV SOLN: 4.0000 mg | INTRAVENOUS | Status: DC | PRN

## 2015-08-12 MED ORDER — PROPOFOL 10 MG/ML IV BOLUS
65.0000 mg | Freq: Once | INTRAVENOUS | Status: AC
  Administered 2015-08-12: 65 mg via INTRAVENOUS
  Filled 2015-08-12: qty 20

## 2015-08-12 NOTE — ED Notes (Signed)
Patient here with recurrent right hip pain. Reports that hip was out of place this weekend and was fixed in ED and out again yesterday

## 2015-08-12 NOTE — ED Provider Notes (Addendum)
Medical screening examination/treatment/procedure(s) were conducted as a shared visit with non-physician practitioner(s) and myself.  I personally evaluated the patient during the encounter.   EKG Interpretation None        Procedural sedation Performed by: Forde Dandy Consent: Verbal consent obtained. Risks and benefits: risks, benefits and alternatives were discussed Required items: required blood products, implants, devices, and special equipment available Patient identity confirmed: arm band and provided demographic data Time out: Immediately prior to procedure a "time out" was called to verify the correct patient, procedure, equipment, support staff and site/side marked as required.  Sedation type: moderate (conscious) sedation NPO time confirmed and considedered  Sedatives: PROPOFOL  Physician Time at Bedside: 35 minutes  Vitals: Vital signs were monitored during sedation. Cardiac Monitor, pulse oximeter Patient tolerance: Patient tolerated the procedure well with no immediate complications. Comments: Pt with uneventful recovered. Returned to pre-procedural sedation baseline  Reduction of dislocation Date/Time: 10:04 PM Performed by: Forde Dandy Authorized by: Forde Dandy Consent: Verbal consent obtained. Risks and benefits: risks, benefits and alternatives were discussed Consent given by: patient Required items: required blood products, implants, devices, and special equipment available Time out: Immediately prior to procedure a "time out" was called to verify the correct patient, procedure, equipment, support staff and site/side marked as required.  Patient sedated: Propofol  Vitals: Vital signs were monitored during sedation. Patient tolerance: Patient tolerated the procedure well with no immediate complications. Joint: Right hip Reduction technique: traction, counter traction   65 year old male with history of right hip arthroplasty who presents with  recurrent right hip dislocation. I was present in the ED 2 days ago for dislocation of the right hip which was reduced by conscious sedation by emergency department provider. States that upon discharge in the car began to notice pain and funny sensation in his right hip, and feels like his hip over the past day has become dislocated again. Has not had any fall or trauma. Denies any numbness or weakness. Here with stable vital signs. With dislocated right prosthetic hip on x-ray. Extremity is neurovascularly intact. Reduction was attempted under propofol sedation. Initially received 0.5 mg/kg dose of propofol with inadequate sedation, even after redosing with 0.5 mg/kg dose. Unable to reduce at bedside as unable to get enough muscle relaxaton.  Waited until patient fully awoke and gave full 1 mg/kg dose propofol for repeat sedation. With initial reduction at bedside, but spontaneous dislocation after traction relaxed to place knee immobolizer. Discussed with Dr. Latanya Maudlin, recommending medicine admission for OR reduction by Dr. Mayer Camel tomorrow.     Forde Dandy, MD 08/12/15 [place], MD 08/12/15 [phone]

## 2015-08-12 NOTE — Discharge Instructions (Signed)
Your hip was relocated. It is important for you to follow-up with your surgeon for further evaluation and management of your hip. Take your pain medication as prescribed and as we discussed. Take the naproxen for mild/moderate pain. Return to ED for any new or worsening symptoms as we discussed.

## 2015-08-12 NOTE — ED Provider Notes (Signed)
Patient hand off by Jaquita Folds, PA-C at end of shift.  Past Medical History  Diagnosis Date  . Obesity   . Arthritis   . Hypertension   . Chronic systolic CHF (congestive heart failure), NYHA class 2 ([place])   . DCM (dilated cardiomyopathy) ([place])     EF 30% but now improved to 40-45% by echo 2016 on medical therapy with moderate MR  . Pancytopenia ([place])   . ETOH abuse   . Alcoholic liver disease ([place])   . Gastroduodenitis     H Pylori positive  . Chronic atrial fibrillation ([name])     no an anticoagulation canditate due to alcohol abuse, liver cirrhosis with pancytopenia, medical noncompliance  . Diverticulosis of colon   . Portal hypertensive gastropathy   . Personal history of colonic polyps - adenomas 08/21/2013  . Helicobacter pylori gastritis 10/19/2013  . Edema extremities 07/02/2015     He had a total hip done by Dr. Mayer Camel in January of 2017. Since then he has dislocated his hip on 06/12/15 and 08/10/2015. His hip was reduced in the ER both times without difficulty. Today he presents because he can tell that his hip is out. He feels like it went out after leaving the hospital previously. He admits to falling on 4/22.   We attempted a reduction in the ED which the hip relocated with a reassuring pop and equal leg lengths but then popped immediately out of joint and on confirmation of repeat port xray it continues to be out of joint.   10:30pm Will contact on-call for Dr. Mayer Camel, with [facility].  10:40 pm I spoke with Dr. Ed Blalock with [facility]. Since the patient cannot walk he recommends admitting the patient per hip/femur fracture protocol to medicine and says Dr. Mayer Camel will see patient in the morning and likely have to take to surgery.  BMP shows elevated creatinine, patient reports he has not had anything to eat or drink for many hours. Will order diet and IV fluids.  11: 18 pm BMP only has resulted but I discussed case with [facility] Dr. Tamala Julian, R. who has  agreed to admit patient per hip/femur fracture protocol. I will continue to monitor patients labs and will contact Dr. Tamala Julian if any lab results return significantly abnormal.  Delos Haring, PA-C 08/12/15 [place] Liu, MD 08/13/15 [phone]

## 2015-08-12 NOTE — H&P (Addendum)
History and Physical    [name] [id number] DOB: [birth year] DOA: 08/12/2015  Referring MD/NP/PA: Verdene Rio, PA PCP: Hoyt Koch, MD   Outpatient Specialists: Dr. Mayer Camel  Patient coming from:  Home  Chief Complaint: Right hip pain  HPI: Thomas [name] is a 65 y.o. male with medical history significant of pancytopenia, dilated cardiomyopathy, chronic systolic CHF, HTN, obesity, chronic atrial fibrillation not on anticoagulation, s/p right hip replacement in 04/2015 by Dr. Mayer Camel; who presents with complaints of right hip pain. He states that he was chest here 2 days ago with similar complaints. At that time he had slipped and fell while wearing compression stockings and was having pain with moving the right hip. He was seen in the emergency department and had x-ray imaging which showed right hip dislocation. He was sedated with propofol and the hip was relocated. However, when the patient left the hospital he states that his leg did not feel like it was still in place. Initially, stating that it felt like his leg went to sleep. When he awoke the following morning he was unable to be weighed on the affected foot. He expresses sharp pain that he rates a 7 out of 10. Subsequently, today reports continued inability to bear weight for which he presented to the ER. Patient continued to express drinking couple beers daily.  ED Course:  Upon admission to the emergency department patient was evaluated and x-ray imaging of the right hip showed that it was again dislocated. The ED physician attempted once to relocate the hip, but the attempt was unsuccessful. They contacted Dr. Mayer Camel orthopedics who recommended admit for OR reduction in a.m.  Review of Systems: As per HPI otherwise 10 point review of systems negative.    Past Medical History  Diagnosis Date  . Obesity   . Arthritis   . Hypertension   . Chronic systolic CHF (congestive heart failure), NYHA class 2 ([place])   . DCM  (dilated cardiomyopathy) ([place])     EF 30% but now improved to 40-45% by echo 2016 on medical therapy with moderate MR  . Pancytopenia ([place])   . ETOH abuse   . Alcoholic liver disease ([place])   . Gastroduodenitis     H Pylori positive  . Chronic atrial fibrillation ([name])     no an anticoagulation canditate due to alcohol abuse, liver cirrhosis with pancytopenia, medical noncompliance  . Diverticulosis of colon   . Portal hypertensive gastropathy   . Personal history of colonic polyps - adenomas 08/21/2013  . Helicobacter pylori gastritis 10/19/2013  . Edema extremities 07/02/2015    Past Surgical History  Procedure Laterality Date  . Esophagogastroduodenoscopy N/A 08/21/2013    Procedure: ESOPHAGOGASTRODUODENOSCOPY (EGD);  Surgeon: Jerene Bears, MD;  Location: [facility] ENDOSCOPY;  Service: Endoscopy;  Laterality: N/A;  . Colonoscopy N/A 08/21/2013    Procedure: COLONOSCOPY;  Surgeon: Jerene Bears, MD;  Location: [facility] ENDOSCOPY;  Service: Endoscopy;  Laterality: N/A;  . Total hip arthroplasty Right 05/20/2015    Procedure: TOTAL HIP ARTHROPLASTY;  Surgeon: Frederik Pear, MD;  Location: [facility];  Service: Orthopedics;  Laterality: Right;     reports that he has never smoked. He has never used smokeless tobacco. He reports that he drinks about 4.8 oz of alcohol per week. He reports that he uses illicit drugs (Marijuana).  No Known Allergies  Family History  Problem Relation Age of Onset  . Hypertension Father     Prior to Admission medications   Medication Sig Start  Date End Date Taking? Authorizing Provider  aspirin EC 325 MG tablet Take 1 tablet (325 mg total) by mouth 2 (two) times daily. 05/20/15  Yes Leighton Parody, PA-C  carvedilol (COREG) 12.5 MG tablet Take 1 tablet (12.5 mg total) by mouth 2 (two) times daily with a meal. 07/02/15  Yes Sueanne Margarita, MD  folic acid (FOLVITE) 1 MG tablet Take 1 tablet (1 mg total) by mouth daily. 07/31/15  Yes Hoyt Koch, MD  furosemide (LASIX) 40 MG  tablet 80 MG  IN AM  AND  40 MG  IN PM FOR  5 DAYS  THEN RESUME  40 MG  TWICE  DAILY Patient taking differently: Take 40 mg by mouth daily.  07/09/15  Yes Almyra Deforest, PA  ibuprofen (ADVIL,MOTRIN) 400 MG tablet Take 400 mg by mouth every 6 (six) hours as needed for moderate pain.   Yes Historical Provider, MD  lidocaine (LIDODERM) 5 % Place 1 patch onto the skin 2 (two) times daily as needed. Remove & Discard patch within 12 hours or as directed by MD   Yes Historical Provider, MD  losartan (COZAAR) 25 MG tablet Take 0.5 tablets (12.5 mg total) by mouth daily. 07/31/15  Yes Hoyt Koch, MD  methocarbamol (ROBAXIN) 500 MG tablet Take 1 tablet (500 mg total) by mouth 2 (two) times daily with a meal. 05/20/15  Yes Leighton Parody, PA-C  Multiple Vitamin (MULTIVITAMIN WITH MINERALS) TABS tablet Take 1 tablet by mouth daily. 08/24/13  Yes Geradine Girt, DO  potassium chloride SA (K-DUR,KLOR-CON) 20 MEQ tablet Take 2 tablets (40 mEq total) by mouth daily. 07/02/15  Yes Sueanne Margarita, MD  thiamine 100 MG tablet Take 1 tablet (100 mg total) by mouth daily. 07/31/15  Yes Hoyt Koch, MD  traMADol (ULTRAM) 50 MG tablet Take 50 mg by mouth 2 (two) times daily as needed for moderate pain.  07/04/15  Yes Historical Provider, MD  HYDROcodone-acetaminophen (NORCO/VICODIN) 5-325 MG tablet Take 1-2 tablets by mouth every 4 (four) hours as needed. 08/12/15   Comer Locket, PA-C  naproxen (NAPROSYN) 500 MG tablet Take 1 tablet (500 mg total) by mouth 2 (two) times daily. 08/12/15   Comer Locket, PA-C  oxyCODONE-acetaminophen (PERCOCET/ROXICET) 5-325 MG tablet Take 2 tablets by mouth every 4 (four) hours as needed. Patient not taking: Reported on 08/12/2015 06/12/15   Tanna Furry, MD    Physical Exam: Filed Vitals:   08/12/15 2142 08/12/15 2145 08/12/15 2150 08/12/15 2155  BP: 123/77 105/71 90/64 95/70   Pulse: 94 96 100 94  Temp:      TempSrc:      Resp: 22 14 20 20   Height:      Weight:      SpO2:  100% 100% 100% 100%      Constitutional: morbidly obeseNAD, calm, comfortable Filed Vitals:   08/12/15 2142 08/12/15 2145 08/12/15 2150 08/12/15 2155  BP: 123/77 105/71 90/64 95/70   Pulse: 94 96 100 94  Temp:      TempSrc:      Resp: 22 14 20 20   Height:      Weight:      SpO2: 100% 100% 100% 100%   Eyes: PERRL, lids and conjunctivae normal ENMT: Mucous membranes are moist. Posterior pharynx clear of any exudate or lesions.Normal dentition.  Neck: normal, supple, no masses, no thyromegaly Respiratory: clear to auscultation bilaterally, no wheezing, no crackles. Normal respiratory effort. No accessory muscle use.  Cardiovascular: Irregular irregular rhythm. 2+  pitting edema noted bilaterally. 2+ pedal pulses. No carotid bruits.  Abdomen: no tenderness, no masses palpated. No hepatosplenomegaly. Bowel sounds positive.  Musculoskeletal: no clubbing / cyanosis. Dislocation of the right hip joint with tenderness to palpation and decreased range of motion secondary to pain.  no contractures. Normal muscle tone.  Skin: no rashes, lesions, ulcers. No induration Neurologic: CN 2-12 grossly intact. Sensation intact, DTR normal. Strength 5/5 in all 4.  Psychiatric: Normal judgment and insight. Alert and oriented x 3. Normal mood.    Labs on Admission: I have personally reviewed following labs and imaging studies  CBC:  Recent Labs Lab 08/12/15 2242  WBC 10.2  NEUTROABS 7.2  HGB 8.8*  HCT 26.7*  MCV 96.0  PLT [id number]*   Basic Metabolic Panel:  Recent Labs Lab 08/12/15 2242  NA 137  K 3.9  CL 101  CO2 26  GLUCOSE 101*  BUN 18  CREATININE 1.26*  CALCIUM 8.7*   GFR: Estimated Creatinine Clearance: 84.5 mL/min (by C-G formula based on Cr of 1.26). Liver Function Tests: No results for input(s): AST, ALT, ALKPHOS, BILITOT, PROT, ALBUMIN in the last 168 hours. No results for input(s): LIPASE, AMYLASE in the last 168 hours. No results for input(s): AMMONIA in the last 168  hours. Coagulation Profile: No results for input(s): INR, PROTIME in the last 168 hours. Cardiac Enzymes: No results for input(s): CKTOTAL, CKMB, CKMBINDEX, TROPONINI in the last 168 hours. BNP (last 3 results) No results for input(s): PROBNP in the last 8760 hours. HbA1C: No results for input(s): HGBA1C in the last 72 hours. CBG: No results for input(s): GLUCAP in the last 168 hours. Lipid Profile: No results for input(s): CHOL, HDL, LDLCALC, TRIG, CHOLHDL, LDLDIRECT in the last 72 hours. Thyroid Function Tests: No results for input(s): TSH, T4TOTAL, FREET4, T3FREE, THYROIDAB in the last 72 hours. Anemia Panel: No results for input(s): VITAMINB12, FOLATE, FERRITIN, TIBC, IRON, RETICCTPCT in the last 72 hours. Urine analysis:    Component Value Date/Time   COLORURINE YELLOW 05/09/2015 1054   APPEARANCEUR CLEAR 05/09/2015 1054   LABSPEC 1.019 05/09/2015 1054   PHURINE 6.5 05/09/2015 1054   GLUCOSEU NEGATIVE 05/09/2015 1054   HGBUR NEGATIVE 05/09/2015 1054   [place] 05/09/2015 1054   [place] 05/09/2015 1054   PROTEINUR NEGATIVE 05/09/2015 1054   NITRITE NEGATIVE 05/09/2015 1054   LEUKOCYTESUR NEGATIVE 05/09/2015 1054   Sepsis Labs: @LABRCNTIP (procalcitonin:4,lacticidven:4) )No results found for this or any previous visit (from the past 240 hour(s)).   Radiological Exams on Admission: Dg Hip Port Unilat With Pelvis 1v Right  08/12/2015  CLINICAL DATA:  Postreduction right hip.  Initial encounter. EXAM: DG HIP (WITH OR WITHOUT PELVIS) 1V PORT RIGHT COMPARISON:  Earlier today FINDINGS: Total right hip arthroplasty remains superiorly dislocated. No visible periprosthetic fracture. IMPRESSION: Persistent superior dislocation of the prosthetic right hip. Electronically Signed   By: Monte Fantasia M.D.   On: 08/12/2015 22:32   Dg Hip Unilat With Pelvis 2-3 Views Right  08/12/2015  CLINICAL DATA:  Right hip pain and limited range of motion after falling 2 days  ago. Right total hip arthroplasty 3 months ago. EXAM: DG HIP (WITH OR WITHOUT PELVIS) 2-3V RIGHT COMPARISON:  Radiographs 08/10/2015. FINDINGS: There is no lateral view for the current study. Patient is status post right total hip arthroplasty. There is recurrent dislocation of the prosthesis with the femoral head located superolateral to the acetabulum. No evidence of acute fracture. Heterotopic ossification is noted around the right hip and  ischium. There are stable degenerative changes of the left hip and lower lumbar spine. IMPRESSION: Recurrent dislocation of the right total hip arthroplasty without evidence of associated acute fracture. Electronically Signed   By: Richardean Sale M.D.   On: 08/12/2015 17:15    EKG: Independently reviewed. Atrial fibrillation with a rate 96  Assessment/Plan Recurrent dislocation of hip joint prosthesis: Unsuccessful relocation of right hip in ED. Orthopedics consulted for further management. - admit to MedSurg. - IVF NS at 53ml/hr - npo - Hydrocodone prn pain - Dr. Mayer Camel of orthopedics to take to surgery in am  Essential hypertension - Continue Coreg and losartan   Acute kidney injury: Baseline creatinine appears to be less than 1. However, on admission patient's creatinine elevated at 1.26 with a BUN 18. This could be prerenal in nature given patient's history of inability to ambulate - Follow-up repeat BMP after IV fluids  Pancytopenia w/Thrombocytopenia and anemia: Patient's baseline hemoglobin appears to run around 8-9 g/dL.   platelet counts seem to run around 100. - Continue to monitor  - repeat CBC in am  Systolic CHF: Patient appears to have a mixed picture with 2+ pitting edema noted on the bilateral lower extremities - Strict I&Os and daily weights - Check BNP - Will need to restart patient's home Lasix  Alcohol abuse - CWIAA protocols - Continue folic acid and thiamine  Chronic atrial fibrillation: It appears patient was deemed a poor  anticoagulation candidate secondary to alcohol abuse. - Continue metoprolol   DVT prophylaxis: Lovenox Code Status:  Full  Family Communication: None  Disposition Plan:  possible discharge home 1-2 days Consults called: Dr. Mayer Camel orthopedics Admission status: Observation Medsurg  Norval Morton MD [facility] Pager [phone]  If 7PM-7AM, please contact night-coverage [email or web address] Password [facility]  08/12/2015, 11:59 PM

## 2015-08-12 NOTE — ED Provider Notes (Signed)
CSN: [id number]     Arrival date & time 08/12/15  1608 History   First MD Initiated Contact with Patient 08/12/15 1723     Chief Complaint  Patient presents with  . Hip Injury     (Consider location/radiation/quality/duration/timing/severity/associated sxs/prior Treatment) HPI Thomas [name] is a 65 y.o. male history of CHF, obesity, A. fib, recent right hip arthroplasty in February, comes in for evaluation of head injury. Patient reports on Saturday he had dislocated his hip, came to the emergency department and had it relocated, but upon leaving emergency Department he "did not feel right" and hip subsequently dislocated Sunday afternoon. He denies any numbness, tingling or weakness. Nothing makes problem better or worse. No other modifying factors.  Past [name] History  Diagnosis Date  . Obesity   . Arthritis   . Hypertension   . Chronic systolic CHF (congestive heart failure), NYHA class 2 ([name])   . DCM (dilated cardiomyopathy) ([name])     EF 30% but now improved to 40-45% by echo 2016 on [name] therapy with moderate MR  . Pancytopenia (Thomas [name])   . ETOH abuse   . Alcoholic liver disease (Thomas [name])   . Gastroduodenitis     H Pylori positive  . Chronic atrial fibrillation (Thomas [name])     no an anticoagulation canditate due to alcohol abuse, liver cirrhosis with pancytopenia, [name] noncompliance  . Diverticulosis of colon   . Portal hypertensive gastropathy   . Personal history of colonic polyps - adenomas 08/21/2013  . Helicobacter pylori gastritis 10/19/2013  . Edema extremities 07/02/2015   Past Surgical History  Procedure Laterality Date  . Esophagogastroduodenoscopy N/A 08/21/2013    Procedure: ESOPHAGOGASTRODUODENOSCOPY (EGD);  Surgeon: Thomas Bears, MD;  [name];  Service: Endoscopy;  Laterality: N/A;  . Colonoscopy N/A 08/21/2013    Procedure: COLONOSCOPY;  Surgeon: Thomas Bears, MD;  Location: [name];  Service: Endoscopy;  Laterality: N/A;  . Total hip arthroplasty  Right 05/20/2015    Procedure: TOTAL HIP ARTHROPLASTY;  Surgeon: Thomas Pear, MD;  Location: [name];  Service: Orthopedics;  Laterality: Right;   Family History  Problem Relation Age of Onset  . Hypertension Father    Social History  Substance Use Topics  . Smoking status: Never Smoker   . Smokeless tobacco: Never Used  . Alcohol Use: 4.8 oz/week    8 Cans of beer per week     Comment: former ETOH abuse     Review of Systems A 10 point review of systems was completed and was negative except for pertinent positives and negatives as mentioned in the history of present illness     Allergies  Review of patient's allergies indicates no known allergies.  Home Medications   Prior to Admission medications   Medication Sig Start Date End Date Taking? Authorizing Provider  aspirin EC 325 MG tablet Take 1 tablet (325 mg total) by mouth 2 (two) times daily. 05/20/15   Leighton Parody, PA-C  carvedilol (COREG) 12.5 MG tablet Take 1 tablet (12.5 mg total) by mouth 2 (two) times daily with a meal. 07/02/15   Sueanne Margarita, MD  folic acid (FOLVITE) 1 MG tablet Take 1 tablet (1 mg total) by mouth daily. 07/31/15   Hoyt Koch, MD  furosemide (LASIX) 40 MG tablet 80 MG  IN AM  AND  40 MG  IN PM FOR  5 DAYS  THEN RESUME  40 MG  TWICE  DAILY Patient taking differently: Take 40 mg by mouth  daily.  07/09/15   Thomas Deforest, PA  ibuprofen (ADVIL,MOTRIN) 400 MG tablet Take 400 mg by mouth every 6 (six) hours as needed for moderate pain.    Historical Provider, MD  lidocaine (LIDODERM) 5 % Place 1 patch onto the skin 2 (two) times daily as needed. Remove & Discard patch within 12 hours or as directed by MD    Historical Provider, MD  losartan (COZAAR) 25 MG tablet Take 0.5 tablets (12.5 mg total) by mouth daily. 07/31/15   Hoyt Koch, MD  methocarbamol (ROBAXIN) 500 MG tablet Take 1 tablet (500 mg total) by mouth 2 (two) times daily with a meal. 05/20/15   Leighton Parody, PA-C  Multiple Vitamin  (MULTIVITAMIN WITH MINERALS) TABS tablet Take 1 tablet by mouth daily. 08/24/13   Thomas Girt, DO  oxyCODONE-acetaminophen (PERCOCET/ROXICET) 5-325 MG tablet Take 2 tablets by mouth every 4 (four) hours as needed. Patient taking differently: Take 2 tablets by mouth every 4 (four) hours as needed for moderate pain.  06/12/15   Thomas Furry, MD  potassium chloride SA (K-DUR,KLOR-CON) 20 MEQ tablet Take 2 tablets (40 mEq total) by mouth daily. 07/02/15   Sueanne Margarita, MD  thiamine 100 MG tablet Take 1 tablet (100 mg total) by mouth daily. 07/31/15   Hoyt Koch, MD  traMADol (ULTRAM) 50 MG tablet Take 50 mg by mouth 2 (two) times daily as needed for moderate pain.  07/04/15   Historical Provider, MD   BP 107/59 mmHg  Pulse 115  Temp(Src) 98.1 F (36.7 C) (Oral)  Resp 20  Ht 6' (1.829 m)  Wt 129.275 kg  BMI 38.64 kg/m2  SpO2 100% Physical Exam  Constitutional: He is oriented to person, place, and time. He appears well-developed and well-nourished.  HENT:  Head: Normocephalic and atraumatic.  Mouth/Throat: Oropharynx is clear and moist.  Eyes: Conjunctivae are normal. Pupils are equal, round, and reactive to light. Right eye exhibits no discharge. Left eye exhibits no discharge. No scleral icterus.  Neck: Neck supple.  Cardiovascular: Normal rate, regular rhythm and normal heart sounds.   Pulmonary/Chest: Effort normal and breath sounds normal. No respiratory distress. He has no wheezes. He has no rales.  Abdominal: Soft. There is no tenderness.  Musculoskeletal: He exhibits no tenderness.  Palpable deformity right hip femoral head.  Neurological: He is alert and oriented to person, place, and time.  Cranial Nerves II-XII grossly intact. Motor strength distal to injury appears baseline. Sensation intact to light touch. DP intact and equal bil.  Skin: Skin is warm and dry. No rash noted.  Psychiatric: He has a normal mood and affect.  Nursing note and vitals reviewed.   ED Course   Procedures (including critical care time) Labs Review Labs Reviewed - No data to display  Imaging Review Dg Hip Thomas Unilat With Pelvis 1v Right  08/10/2015  CLINICAL DATA:  Post reduction EXAM: DG HIP (WITH OR WITHOUT PELVIS) 1V Thomas RIGHT COMPARISON:  None. FINDINGS: Interval reduction of the right hip arthroplasty. Mild degenerative changes of the left hip. Visualized bony pelvis appears intact. Degenerative changes of the lower lumbar spine. IMPRESSION: Interval reduction of the right hip arthroplasty. Electronically Signed   By: Julian Hy M.D.   On: 08/10/2015 23:29   Dg Hip Unilat With Pelvis 2-3 Views Right  08/12/2015  CLINICAL DATA:  Right hip pain and limited range of motion after falling 2 days ago. Right total hip arthroplasty 3 months ago. EXAM: DG HIP (WITH  OR WITHOUT PELVIS) 2-3V RIGHT COMPARISON:  Radiographs 08/10/2015. FINDINGS: There is no lateral view for the current study. Patient is status post right total hip arthroplasty. There is recurrent dislocation of the prosthesis with the femoral head located superolateral to the acetabulum. No evidence of acute fracture. Heterotopic ossification is noted around the right hip and ischium. There are stable degenerative changes of the left hip and lower lumbar spine. IMPRESSION: Recurrent dislocation of the right total hip arthroplasty without evidence of associated acute fracture. Electronically Signed   By: Richardean Sale M.D.   On: 08/12/2015 17:15   I have personally reviewed and evaluated these images and lab results as part of my [name] decision-making.   EKG Interpretation None      MDM  Norrin Alcaraz is a 65 y.o. male with R total hip arthroplasty 1/30 here for recurrent hip dislocation. Xray shows recurrent dislocation of R hip. He remains NVI. First attempt at reduction in ED unsuccessful. Care assumed by my attending, Dr. Oleta Mouse, for subsequent Reduction attempt. If successful, pt will need to continue with knee  immobilizer and f/u with his orthopedist, Dr. Mayer Camel. If unsuccessful, plan to consult Dr. Mayer Camel for further evaluation. . Final diagnoses:  Recurrent dislocation of hip joint prosthesis, initial encounter [name])       Comer Locket, PA-C 08/13/15 [place] Liu, MD 08/16/15 1115

## 2015-08-13 ENCOUNTER — Observation Stay (HOSPITAL_COMMUNITY): Payer: Commercial Managed Care - HMO | Admitting: Certified Registered"

## 2015-08-13 ENCOUNTER — Encounter (HOSPITAL_COMMUNITY)
Admission: EM | Disposition: A | Discharge status: Home / Self Care | Payer: Self-pay | Source: Home / Self Care | Attending: Emergency Medicine

## 2015-08-13 ENCOUNTER — Observation Stay (HOSPITAL_COMMUNITY): Payer: Commercial Managed Care - HMO

## 2015-08-13 DIAGNOSIS — I5022 Chronic systolic (congestive) heart failure: Secondary | ICD-10-CM

## 2015-08-13 DIAGNOSIS — F1010 Alcohol abuse, uncomplicated: Secondary | ICD-10-CM | POA: Diagnosis not present

## 2015-08-13 DIAGNOSIS — N179 Acute kidney failure, unspecified: Secondary | ICD-10-CM | POA: Diagnosis present

## 2015-08-13 DIAGNOSIS — M24451 Recurrent dislocation, right hip: Secondary | ICD-10-CM | POA: Diagnosis not present

## 2015-08-13 DIAGNOSIS — I482 Chronic atrial fibrillation: Secondary | ICD-10-CM | POA: Diagnosis not present

## 2015-08-13 DIAGNOSIS — M24459 Recurrent dislocation, unspecified hip: Secondary | ICD-10-CM | POA: Diagnosis present

## 2015-08-13 DIAGNOSIS — I10 Essential (primary) hypertension: Secondary | ICD-10-CM

## 2015-08-13 HISTORY — PX: HIP CLOSED REDUCTION: SHX983

## 2015-08-13 LAB — SURGICAL PCR SCREEN
MRSA, PCR: NEGATIVE
STAPHYLOCOCCUS AUREUS: NEGATIVE

## 2015-08-13 LAB — TYPE AND SCREEN
ABO/RH(D): A POS
ANTIBODY SCREEN: NEGATIVE

## 2015-08-13 LAB — BASIC METABOLIC PANEL
Anion gap: 10 (ref 5–15)
BUN: 17 mg/dL (ref 6–20)
CALCIUM: 8.3 mg/dL — AB (ref 8.9–10.3)
CHLORIDE: 100 mmol/L — AB (ref 101–111)
CO2: 25 mmol/L (ref 22–32)
CREATININE: 1.17 mg/dL (ref 0.61–1.24)
GFR calc non Af Amer: >60 mL/min (ref >60)
Glucose, Bld: 114 mg/dL — ABNORMAL HIGH (ref 65–99)
Potassium: 3.4 mmol/L — ABNORMAL LOW (ref 3.5–5.1)
SODIUM: 135 mmol/L (ref 135–145)

## 2015-08-13 LAB — CBC
HCT: 23.9 % — ABNORMAL LOW (ref 39.0–52.0)
HEMOGLOBIN: 7.9 g/dL — AB (ref 13.0–17.0)
MCH: 31.7 pg (ref 26.0–34.0)
MCHC: 33.1 g/dL (ref 30.0–36.0)
MCV: 96 fL (ref 78.0–100.0)
PLATELETS: 106 10*3/uL — AB (ref 150–400)
RBC: 2.49 MIL/uL — ABNORMAL LOW (ref 4.22–5.81)
RDW: 15.9 % — AB (ref 11.5–15.5)
WBC: 8 10*3/uL (ref 4.0–10.5)

## 2015-08-13 LAB — PROTIME-INR
INR: 1.33 (ref 0.00–1.49)
Prothrombin Time: 16.6 seconds — ABNORMAL HIGH (ref 11.6–15.2)

## 2015-08-13 LAB — URINALYSIS, ROUTINE W REFLEX MICROSCOPIC
Bilirubin Urine: NEGATIVE
GLUCOSE, UA: NEGATIVE mg/dL
HGB URINE DIPSTICK: NEGATIVE
KETONES UR: 15 mg/dL — AB
Leukocytes, UA: NEGATIVE
Nitrite: NEGATIVE
PROTEIN: NEGATIVE mg/dL
Specific Gravity, Urine: 1.019 (ref 1.005–1.030)
pH: 6 (ref 5.0–8.0)

## 2015-08-13 LAB — BRAIN NATRIURETIC PEPTIDE: B NATRIURETIC PEPTIDE 5: 130.2 pg/mL — AB (ref 0.0–100.0)

## 2015-08-13 SURGERY — CLOSED REDUCTION, HIP
Anesthesia: General | Site: Hip | Laterality: Right

## 2015-08-13 MED ORDER — LOSARTAN POTASSIUM 25 MG PO TABS: 12.5000 mg | ORAL_TABLET | Freq: Every day | ORAL | Status: DC

## 2015-08-13 MED ORDER — ACETAMINOPHEN 650 MG RE SUPP: 650.0000 mg | Freq: Four times a day (QID) | RECTAL | Status: DC | PRN

## 2015-08-13 MED ORDER — ADULT MULTIVITAMIN W/MINERALS CH
1.0000 | ORAL_TABLET | Freq: Every day | ORAL | Status: DC
  Administered 2015-08-14: 1 via ORAL
  Filled 2015-08-13: qty 1

## 2015-08-13 MED ORDER — LORAZEPAM 2 MG/ML IJ SOLN: 1.0000 mg | Freq: Four times a day (QID) | INTRAMUSCULAR | Status: DC | PRN

## 2015-08-13 MED ORDER — ASPIRIN EC 325 MG PO TBEC
325.0000 mg | DELAYED_RELEASE_TABLET | Freq: Two times a day (BID) | ORAL | Status: DC
  Administered 2015-08-14 (×2): 325 mg via ORAL
  Filled 2015-08-13 (×2): qty 1

## 2015-08-13 MED ORDER — LORAZEPAM 1 MG PO TABS
1.0000 mg | ORAL_TABLET | Freq: Four times a day (QID) | ORAL | Status: DC | PRN
Start: 2015-08-13 — End: 2015-08-14

## 2015-08-13 MED ORDER — ALBUTEROL SULFATE (2.5 MG/3ML) 0.083% IN NEBU: 2.5000 mg | INHALATION_SOLUTION | RESPIRATORY_TRACT | Status: DC | PRN

## 2015-08-13 MED ORDER — METHOCARBAMOL 500 MG PO TABS
500.0000 mg | ORAL_TABLET | Freq: Two times a day (BID) | ORAL | Status: DC
  Administered 2015-08-14 (×3): 500 mg via ORAL
  Filled 2015-08-13 (×3): qty 1

## 2015-08-13 MED ORDER — CARVEDILOL 12.5 MG PO TABS: 12.5000 mg | ORAL_TABLET | Freq: Two times a day (BID) | ORAL | Status: DC

## 2015-08-13 MED ORDER — FENTANYL CITRATE (PF) 100 MCG/2ML IJ SOLN: 25.0000 ug | INTRAMUSCULAR | Status: DC | PRN

## 2015-08-13 MED ORDER — ACETAMINOPHEN 325 MG PO TABS
650.0000 mg | ORAL_TABLET | Freq: Four times a day (QID) | ORAL | Status: DC | PRN
Start: 2015-08-13 — End: 2015-08-14

## 2015-08-13 MED ORDER — ENOXAPARIN SODIUM 40 MG/0.4ML ~~LOC~~ SOLN: 40.0000 mg | SUBCUTANEOUS | Status: DC

## 2015-08-13 MED ORDER — METOPROLOL TARTRATE 12.5 MG HALF TABLET
12.5000 mg | ORAL_TABLET | Freq: Two times a day (BID) | ORAL | Status: DC
  Administered 2015-08-13 – 2015-08-14 (×3): 12.5 mg via ORAL
  Filled 2015-08-13 (×3): qty 1

## 2015-08-13 MED ORDER — VITAMIN B-1 100 MG PO TABS
100.0000 mg | ORAL_TABLET | Freq: Every day | ORAL | Status: DC
  Administered 2015-08-14: 100 mg via ORAL
  Filled 2015-08-13: qty 1

## 2015-08-13 MED ORDER — SODIUM CHLORIDE 0.9 % IV SOLN
INTRAVENOUS | Status: DC
  Administered 2015-08-13: 01:00:00 via INTRAVENOUS

## 2015-08-13 MED ORDER — ONDANSETRON HCL 4 MG/2ML IJ SOLN: 4.0000 mg | Freq: Once | INTRAMUSCULAR | Status: DC | PRN

## 2015-08-13 MED ORDER — LIDOCAINE 5 % EX PTCH
1.0000 | MEDICATED_PATCH | Freq: Two times a day (BID) | CUTANEOUS | Status: DC | PRN
  Filled 2015-08-13: qty 1

## 2015-08-13 MED ORDER — FOLIC ACID 1 MG PO TABS
1.0000 mg | ORAL_TABLET | Freq: Every day | ORAL | Status: DC
  Administered 2015-08-14: 1 mg via ORAL
  Filled 2015-08-13: qty 1

## 2015-08-13 MED ORDER — PROPOFOL 10 MG/ML IV BOLUS
INTRAVENOUS | Status: DC | PRN
  Administered 2015-08-13 (×5): 50 mg via INTRAVENOUS

## 2015-08-13 MED ORDER — POTASSIUM CHLORIDE CRYS ER 20 MEQ PO TBCR
40.0000 meq | EXTENDED_RELEASE_TABLET | Freq: Every day | ORAL | Status: DC
  Administered 2015-08-14: 40 meq via ORAL
  Filled 2015-08-13: qty 2

## 2015-08-13 MED ORDER — LACTATED RINGERS IV SOLN
INTRAVENOUS | Status: DC | PRN
  Administered 2015-08-13: 13:00:00 via INTRAVENOUS

## 2015-08-13 SURGICAL SUPPLY — 1 items: 22" UNIVERSAL BASIC KNEE SPLINT ×2 IMPLANT

## 2015-08-13 NOTE — Progress Notes (Signed)
Orthopedic Tech Progress Note Patient Details:  [name] [birth year] [id number] Brace order completed by bio-tech vendor. Patient ID: Thomas [name], male   DOB: 1950/09/26, 65 y.o.   MRN: [id number]   Braulio Bosch 08/13/2015, 5:01 PM

## 2015-08-13 NOTE — H&P (View-Only) (Signed)
[name], MD  Chauncey Cruel, PA-C  Loni Dolly, PA-C                                  [facility]                [address] [place], [place]  [address]   ORTHOPAEDIC CONSULTATION  Thomas [name]            MRN:  [id number] DOB/SEX:  February 23, 1951/male     CHIEF COMPLAINT:  Painful right THR  HISTORY: Thomas Phillipsis a 65 y.o. male with history of THR by Dr Mayer Camel in January which has dislocated on three occasions. Came out over weekend and put in by EDP. Never felt right and came out again yesterday. EDP tried again last night unsuccessfully.  Admitted.   PAST MEDICAL HISTORY: Patient Active Problem List   Diagnosis Date Noted  . Recurrent dislocation of hip 08/13/2015  . Recurrent dislocation of hip joint prosthesis ([place]) 08/12/2015  . Edema extremities 07/02/2015  . Primary osteoarthritis of right hip 05/18/2015  . Non compliance w medication regimen 02/25/2015  . Right hip pain 02/25/2015  . Lead toxicity 10/19/2013  . Hypertension   . Personal history of colonic polyps - adenomas 08/21/2013  . Portal hypertensive gastropathy 08/21/2013  . DCM (dilated cardiomyopathy) ([place]) 08/19/2013  . Chronic systolic CHF (congestive heart failure), NYHA class 2 ([place]) 08/19/2013  . Alcoholic liver disease ([place]) 08/18/2013  . Chronic atrial fibrillation ([place]) 08/17/2013  . Pancytopenia ([place]) 08/17/2013  . ETOH abuse 08/17/2013   Past Medical History  Diagnosis Date  . Obesity   . Arthritis   . Hypertension   . Chronic systolic CHF (congestive heart failure), NYHA class 2 ([place])   . DCM (dilated cardiomyopathy) ([place])     EF 30% but now improved to 40-45% by echo 2016 on medical therapy with moderate MR  . Pancytopenia ([place])   . ETOH abuse   . Alcoholic liver disease ([place])   . Gastroduodenitis     H Pylori positive  . Chronic atrial fibrillation ([name])     no an anticoagulation canditate due to alcohol abuse, liver cirrhosis with pancytopenia, medical  noncompliance  . Diverticulosis of colon   . Portal hypertensive gastropathy   . Personal history of colonic polyps - adenomas 08/21/2013  . Helicobacter pylori gastritis 10/19/2013  . Edema extremities 07/02/2015   Past Surgical History  Procedure Laterality Date  . Esophagogastroduodenoscopy N/A 08/21/2013    Procedure: ESOPHAGOGASTRODUODENOSCOPY (EGD);  Surgeon: Jerene Bears, MD;  Location: [facility] ENDOSCOPY;  Service: Endoscopy;  Laterality: N/A;  . Colonoscopy N/A 08/21/2013    Procedure: COLONOSCOPY;  Surgeon: Jerene Bears, MD;  Location: [facility] ENDOSCOPY;  Service: Endoscopy;  Laterality: N/A;  . Total hip arthroplasty Right 05/20/2015    Procedure: TOTAL HIP ARTHROPLASTY;  Surgeon: Frederik Pear, MD;  Location: [facility];  Service: Orthopedics;  Laterality: Right;     MEDICATIONS:   Current facility-administered medications:  .  0.9 %  sodium chloride infusion, , Intravenous, Continuous, Norval Morton, MD, Last Rate: 75 mL/hr at 08/13/15 0118 .  acetaminophen (TYLENOL) tablet 650 mg, 650 mg, Oral, Q6H PRN **OR** acetaminophen (TYLENOL) suppository 650 mg, 650 mg, Rectal, Q6H PRN, Norval Morton, MD .  albuterol (PROVENTIL) (2.5 MG/3ML) 0.083% nebulizer solution 2.5 mg, 2.5 mg, Nebulization, Q2H PRN, Norval Morton, MD .  aspirin EC tablet 325 mg, 325  mg, Oral, BID, Norval Morton, MD, 325 mg at 08/13/15 0119 .  carvedilol (COREG) tablet 12.5 mg, 12.5 mg, Oral, BID WC, Rondell A Smith, MD .  enoxaparin (LOVENOX) injection 40 mg, 40 mg, Subcutaneous, Q24H, Rondell A Smith, MD .  folic acid (FOLVITE) tablet 1 mg, 1 mg, Oral, Daily, Rondell A Tamala Julian, MD .  HYDROcodone-acetaminophen (NORCO/VICODIN) 5-325 MG per tablet 1-2 tablet, 1-2 tablet, Oral, Q4H PRN, Delos Haring, PA-C .  lidocaine (LIDODERM) 5 % 1 patch, 1 patch, Transdermal, BID PRN, Norval Morton, MD .  losartan (COZAAR) tablet 12.5 mg, 12.5 mg, Oral, Daily, Rondell A Tamala Julian, MD .  methocarbamol (ROBAXIN) tablet 500 mg, 500 mg, Oral, BID WC,  Rondell A Smith, MD .  morphine 4 MG/ML injection 4 mg, 4 mg, Intravenous, Q2H PRN, Delos Haring, PA-C .  multivitamin with minerals tablet 1 tablet, 1 tablet, Oral, Daily, Rondell A Smith, MD .  ondansetron (ZOFRAN) injection 4 mg, 4 mg, Intravenous, Q8H PRN, Delos Haring, PA-C .  potassium chloride SA (K-DUR,KLOR-CON) CR tablet 40 mEq, 40 mEq, Oral, Daily, Rondell A Smith, MD .  thiamine (VITAMIN B-1) tablet 100 mg, 100 mg, Oral, Daily, Rondell Charmayne Sheer, MD  ALLERGIES:  No Known Allergies  REVIEW OF SYSTEMS: REVIEWED IN DETAIL IN CHART  FAMILY HISTORY:   Family History  Problem Relation Age of Onset  . Hypertension Father     SOCIAL HISTORY:   Social History  Substance Use Topics  . Smoking status: Never Smoker   . Smokeless tobacco: Never Used  . Alcohol Use: 4.8 oz/week    8 Cans of beer per week     Comment: former ETOH abuse      EXAMINATION: Vital signs in last 24 hours: Temp:  [98.1 F (36.7 C)-98.3 F (36.8 C)] 98.3 F (36.8 C) (04/25 0359) Pulse Rate:  [86-115] 93 (04/25 0359) Resp:  [12-22] 18 (04/25 0359) BP: (90-126)/(50-80) 98/58 mmHg (04/25 0359) SpO2:  [98 %-100 %] 98 % (04/25 0359) Weight:  [285 lb (129.275 kg)] 285 lb (129.275 kg) (04/24 1628)  BP 98/58 mmHg  Pulse 93  Temp(Src) 98.3 F (36.8 C) (Oral)  Resp 18  Ht 6' (1.829 m)  Wt 285 lb (129.275 kg)  BMI 38.64 kg/m2  SpO2 98%  General Appearance:    Alert, cooperative, no distress, appears stated age  Head:    Normocephalic, without obvious abnormality, atraumatic  Eyes:    PERRL, conjunctiva/corneas clear, EOM's intact, fundi    benign, both eyes       Ears:    Normal TM's and external ear canals, both ears  Nose:   Nares normal, septum midline, mucosa normal, no drainage    or sinus tenderness  Throat:   Lips, mucosa, and tongue normal; teeth and gums normal  Neck:   Supple, symmetrical, trachea midline, no adenopathy;       thyroid:  No enlargement/tenderness/nodules; no carotid    bruit or JVD  Back:     Symmetric, no curvature, ROM normal, no CVA tenderness  Lungs:     Clear to auscultation bilaterally, respirations unlabored  Chest wall:    No tenderness or deformity  Heart:    Regular rate and rhythm, S1 and S2 normal, no murmur, rub   or gallop  Abdomen:     Soft, non-tender, bowel sounds active all four quadrants,    no masses, no organomegaly  Genitalia:    Rectal:    Extremities:   Extremities normal, atraumatic,  no cyanosis or edema  Pulses:   2+ and symmetric all extremities  Skin:   Skin color, texture, turgor normal, no rashes or lesions  Lymph nodes:   Cervical, supraclavicular, and axillary nodes normal  Neurologic:   CNII-XII intact. Normal strength, sensation and reflexes      throughout    Musculoskeletal Exam:   Right leg SER   DIAGNOSTIC STUDIES: Recent laboratory studies:  Recent Labs  08/12/15 2242 08/13/15 0452  WBC 10.2 8.0  HGB 8.8* 7.9*  HCT 26.7* 23.9*  PLT 110* 106*    Recent Labs  08/12/15 2242 08/13/15 0452  NA 137 135  K 3.9 3.4*  CL 101 100*  CO2 26 25  BUN 18 17  CREATININE 1.26* 1.17  GLUCOSE 101* 114*  CALCIUM 8.7* 8.3*   Lab Results  Component Value Date   INR 1.33 08/13/2015   INR 1.19 05/09/2015   INR 1.13 02/28/2015     Recent Radiographic Studies :  Dg Hip Unilat With Pelvis 1v Right  08/10/2015  CLINICAL DATA:  Right hip deformity after fall EXAM: DG HIP (WITH OR WITHOUT PELVIS) 1V RIGHT COMPARISON:  06/12/2015 right hip radiographs FINDINGS: Right total hip arthroplasty. There is anterior/ superior dislocation of the right femoral head prosthesis at the right hip joint. No hardware fracture or loosening. No acute osseous fracture. No focal osseous lesion. No pelvic diastasis. IMPRESSION: Anterior superior dislocation of the right femoral head prosthesis at the right hip joint. No hardware or osseous fracture. Electronically Signed   By: Ilona Sorrel M.D.   On: 08/10/2015 17:10   Dg Hip Port Unilat  With Pelvis 1v Right  08/12/2015  CLINICAL DATA:  Postreduction right hip.  Initial encounter. EXAM: DG HIP (WITH OR WITHOUT PELVIS) 1V PORT RIGHT COMPARISON:  Earlier today FINDINGS: Total right hip arthroplasty remains superiorly dislocated. No visible periprosthetic fracture. IMPRESSION: Persistent superior dislocation of the prosthetic right hip. Electronically Signed   By: Monte Fantasia M.D.   On: 08/12/2015 22:32   Dg Hip Port Unilat With Pelvis 1v Right  08/10/2015  CLINICAL DATA:  Post reduction EXAM: DG HIP (WITH OR WITHOUT PELVIS) 1V PORT RIGHT COMPARISON:  None. FINDINGS: Interval reduction of the right hip arthroplasty. Mild degenerative changes of the left hip. Visualized bony pelvis appears intact. Degenerative changes of the lower lumbar spine. IMPRESSION: Interval reduction of the right hip arthroplasty. Electronically Signed   By: Julian Hy M.D.   On: 08/10/2015 23:29   Dg Hip Unilat With Pelvis 2-3 Views Right  08/12/2015  CLINICAL DATA:  Right hip pain and limited range of motion after falling 2 days ago. Right total hip arthroplasty 3 months ago. EXAM: DG HIP (WITH OR WITHOUT PELVIS) 2-3V RIGHT COMPARISON:  Radiographs 08/10/2015. FINDINGS: There is no lateral view for the current study. Patient is status post right total hip arthroplasty. There is recurrent dislocation of the prosthesis with the femoral head located superolateral to the acetabulum. No evidence of acute fracture. Heterotopic ossification is noted around the right hip and ischium. There are stable degenerative changes of the left hip and lower lumbar spine. IMPRESSION: Recurrent dislocation of the right total hip arthroplasty without evidence of associated acute fracture. Electronically Signed   By: Richardean Sale M.D.   On: 08/12/2015 17:15    ASSESSMENT:  Dislocated right THR   PLAN:  Plan CR in OR today.  If unstable will need constrained liner by Dr Mayer Camel later in admission.  Taydem Cavagnaro  G 08/13/2015,  7:08 AM

## 2015-08-13 NOTE — Progress Notes (Signed)
Patient ID: Thomas [name], male   DOB: [birth year], 65 y.o.   MRN: [id number] S: Patient sustained his third postoperative hip dislocation getting out of a chair yesterday. His index procedure was done in January of this year, about a month later he was putting on some TED hose leaned way forward and sustained his first dislocation. He did well until he slipped and fell 3 days ago and underwent closed reduction in the emergency room said it never quite felt right after that and dislocated getting out of a chair yesterday. Attempted closed reduction under propofol in the emergency room yesterday was not successful. My partner Dr. Demetrius Revel will attempt to close reduction today.  Objective: Surgical scar is well-healed he is remarkably little discomfort considering that his hip is dislocated. At his index procedure he had extensive synovial chondromatosis that required a near-complete capsulectomy as part of his procedure. His white count is normal and he is afebrile.  Assessment: Recurrent dislocations after a posterior lateral total hip that was done for extensive synovial chondromatosis as well as a BMI in the low 40s.  Plan: Dr. Rhona Raider roll attempted close reduction today and if it is successful he was placed in a hinge abduction brace, this will probably fabricated by [employer]. If not successful he will be left dislocated until tomorrow at which time I'll put him on as an add-on for placement of a constrained liner.

## 2015-08-13 NOTE — Plan of Care (Addendum)
No med given except beta blocker since NPO.  CHG complete and consent signed for surgery. Report called at 1245.

## 2015-08-13 NOTE — Anesthesia Postprocedure Evaluation (Signed)
Anesthesia Post Note  Patient: Thomas [name]  Procedure(s) Performed: Procedure(s) (LRB): CLOSED REDUCTION HIP (Right)  Patient location during evaluation: PACU Anesthesia Type: General Level of consciousness: awake and alert Pain management: pain level controlled Vital Signs Assessment: post-procedure vital signs reviewed and stable Respiratory status: spontaneous breathing, nonlabored ventilation, respiratory function stable and patient connected to nasal cannula oxygen Cardiovascular status: blood pressure returned to baseline and stable Postop Assessment: no signs of nausea or vomiting Anesthetic complications: no    Last Vitals:  Filed Vitals:   08/13/15 1357 08/13/15 1418  BP: 129/76 121/73  Pulse: 83 83  Temp: 36.1 C 36.7 C  Resp: 25 20    Last Pain:  Filed Vitals:   08/13/15 1419  PainSc: 0-No pain                 Yaneli Keithley JENNETTE

## 2015-08-13 NOTE — Op Note (Signed)
#  [id number] 

## 2015-08-13 NOTE — Transfer of Care (Signed)
Immediate Anesthesia Transfer of Care Note  Patient: Thomas [name]  Procedure(s) Performed: Procedure(s): CLOSED REDUCTION HIP (Right)  Patient Location: PACU  Anesthesia Type:General  Level of Consciousness: awake, alert  and oriented  Airway & Oxygen Therapy: Patient Spontanous Breathing and Patient connected to nasal cannula oxygen  Post-op Assessment: Report given to RN, Post -op Vital signs reviewed and stable and Patient moving all extremities X 4  Post vital signs: Reviewed and stable  Last Vitals:  Filed Vitals:   08/13/15 1326 08/13/15 1330  BP: 111/76   Pulse: 84 89  Temp: 36.7 C   Resp: 14 25    Complications: No apparent anesthesia complications

## 2015-08-13 NOTE — Progress Notes (Signed)
Orthopedic Tech Progress Note Patient Details:  Thomas [name] 08-18-1950 [id number]  Patient ID: Thomas [name], male   DOB: [birth year], 65 y.o.   MRN: [id number] Called in bio-tech brace order; spoke with Thomas [name], Thomas [name] 08/13/2015, 3:07 PM

## 2015-08-13 NOTE — Progress Notes (Signed)
PROGRESS NOTE  Thomas [name] [id number] DOB: Jan 23, 1951 DOA: 08/12/2015 PCP: Hoyt Koch, MD  HPI/Recap of past 24 hours:  Denies pain at rest, No sob, no chest pain  Assessment/Plan: Principal Problem:   Recurrent dislocation of hip Active Problems:   Chronic atrial fibrillation ([name])   Pancytopenia ([name])   ETOH abuse   Acute kidney injury ([place])  Recurrent dislocation of hip joint prosthesis: Unsuccessful relocation of right hip in ED. Orthopedics consulted for further management. - npo - Hydrocodone prn pain - Dr. Mayer Camel of orthopedics to take to surgery on 4/25  Essential hypertension - bp low normal, will hold Coreg and hold losartan, start low dose lopressor for rate control for chronic afib,  -consider resume home meds if bp stable at discharge.  Systolic CHF: last EF [id number] in 02/2015 -does has trace pitting edema noted on the bilateral lower extremities, lung clear - Strict I&Os and daily weights - Check BNP close to baseline - Will need to restart patient's home Lasix once patient start to eat, currently npo for procedure. Resume coreg once bp more stable.  Chronic atrial fibrillation: - It appears patient was deemed a poor anticoagulation candidate secondary to alcohol abuse. He is on asa 325 BID at home. - Continue metoprolol,   Acute kidney injury: Baseline creatinine appears to be less than 1. However, on admission patient's creatinine elevated at 1.26 with a BUN 18. This could be prerenal in nature given patient's history of inability to ambulate - ua pending, cr improved after IV fluids, d/c ivf, continue hold lasix  Pancytopenia w/Thrombocytopenia and anemia: Patient's baseline hemoglobin appears to run around 8-9 g/dL.  platelet counts seem to run around 100. - Continue to monitor  - repeat CBC in am   Alcohol abuse - CWIAA protocols - Continue folic acid and thiamine     DVT prophylaxis: to be addressed by orthopedics post  op, it appears patient has been on asa 325 bid prior to admission Code Status: Full  Family Communication: None  Disposition Plan: possible discharge home 1-2 days Consults called: Dr. Mayer Camel orthopedics Admission status: Observation Medsurg   Procedures:  Closed reduction on 4/25  Antibiotics:  none   Objective: BP 98/58 mmHg  Pulse 93  Temp(Src) 98.3 F (36.8 C) (Oral)  Resp 18  Ht 6' (1.829 m)  Wt 129.275 kg (285 lb)  BMI 38.64 kg/m2  SpO2 98%  Intake/Output Summary (Last 24 hours) at 08/13/15 0839 Last data filed at 08/13/15 0600  Gross per 24 hour  Intake  352.5 ml  Output    400 ml  Net  -47.5 ml   Filed Weights   08/12/15 1628  Weight: 129.275 kg (285 lb)    Exam:   General:  NAD  Cardiovascular: IRRR  Respiratory: CTABL  Abdomen: Soft/ND/NT, positive BS  Musculoskeletal: right leg in immobilizer, neurovascularly intact distally, left leg mild pitting edema  Neuro: aaox3  Data Reviewed: Basic Metabolic Panel:  Recent Labs Lab 08/12/15 2242 08/13/15 0452  NA 137 135  K 3.9 3.4*  CL 101 100*  CO2 26 25  GLUCOSE 101* 114*  BUN 18 17  CREATININE 1.26* 1.17  CALCIUM 8.7* 8.3*   Liver Function Tests: No results for input(s): AST, ALT, ALKPHOS, BILITOT, PROT, ALBUMIN in the last 168 hours. No results for input(s): LIPASE, AMYLASE in the last 168 hours. No results for input(s): AMMONIA in the last 168 hours. CBC:  Recent Labs Lab 08/12/15 2242 08/13/15 0452  WBC 10.2  8.0  NEUTROABS 7.2  --   HGB 8.8* 7.9*  HCT 26.7* 23.9*  MCV 96.0 96.0  PLT 110* 106*   Cardiac Enzymes:   No results for input(s): CKTOTAL, CKMB, CKMBINDEX, TROPONINI in the last 168 hours. BNP (last 3 results)  Recent Labs  02/25/15 1150 08/13/15 0137  BNP 236.2* 130.2*    ProBNP (last 3 results) No results for input(s): PROBNP in the last 8760 hours.  CBG: No results for input(s): GLUCAP in the last 168 hours.  Recent Results (from the past 240  hour(s))  Surgical pcr screen     Status: None   Collection Time: 08/13/15  1:28 AM  Result Value Ref Range Status   MRSA, PCR NEGATIVE NEGATIVE Final   Staphylococcus aureus NEGATIVE NEGATIVE Final    Comment:        The Xpert SA Assay (FDA approved for NASAL specimens in patients over 42 years of age), is one component of a comprehensive surveillance program.  Test performance has been validated by [facility] for patients greater than or equal to 80 year old. It is not intended to diagnose infection nor to guide or monitor treatment.      Studies: Dg Hip Port Unilat With Pelvis 1v Right  08/12/2015  CLINICAL DATA:  Postreduction right hip.  Initial encounter. EXAM: DG HIP (WITH OR WITHOUT PELVIS) 1V PORT RIGHT COMPARISON:  Earlier today FINDINGS: Total right hip arthroplasty remains superiorly dislocated. No visible periprosthetic fracture. IMPRESSION: Persistent superior dislocation of the prosthetic right hip. Electronically Signed   By: Monte Fantasia M.D.   On: 08/12/2015 22:32   Dg Hip Unilat With Pelvis 2-3 Views Right  08/12/2015  CLINICAL DATA:  Right hip pain and limited range of motion after falling 2 days ago. Right total hip arthroplasty 3 months ago. EXAM: DG HIP (WITH OR WITHOUT PELVIS) 2-3V RIGHT COMPARISON:  Radiographs 08/10/2015. FINDINGS: There is no lateral view for the current study. Patient is status post right total hip arthroplasty. There is recurrent dislocation of the prosthesis with the femoral head located superolateral to the acetabulum. No evidence of acute fracture. Heterotopic ossification is noted around the right hip and ischium. There are stable degenerative changes of the left hip and lower lumbar spine. IMPRESSION: Recurrent dislocation of the right total hip arthroplasty without evidence of associated acute fracture. Electronically Signed   By: Richardean Sale M.D.   On: 08/12/2015 17:15    Scheduled Meds: . aspirin EC  325 mg Oral BID  . folic  acid  1 mg Oral Daily  . methocarbamol  500 mg Oral BID WC  . metoprolol tartrate  12.5 mg Oral BID  . multivitamin with minerals  1 tablet Oral Daily  . potassium chloride SA  40 mEq Oral Daily  . thiamine  100 mg Oral Daily    Continuous Infusions: . sodium chloride 75 mL/hr at 08/13/15 0118     Time spent: 31mins  Coletta Lockner MD, PhD  [facility] Pager [phone]. If 7PM-7AM, please contact night-coverage at [email or web address], password [facility] 08/13/2015, 8:39 AM  LOS: 1 day

## 2015-08-13 NOTE — Anesthesia Procedure Notes (Signed)
Date/Time: 08/13/2015 1:05 PM Performed by: Ollen Bowl Pre-anesthesia Checklist: Patient identified, Emergency Drugs available, Suction available, Patient being monitored and Timeout performed Patient Re-evaluated:Patient Re-evaluated prior to inductionOxygen Delivery Method: Circle system utilized Preoxygenation: Pre-oxygenation with 100% oxygen Intubation Type: IV induction Ventilation: Mask ventilation without difficulty Dental Injury: Teeth and Oropharynx as per pre-operative assessment

## 2015-08-13 NOTE — Anesthesia Preprocedure Evaluation (Signed)
Anesthesia Evaluation  Patient identified by MRN, date of birth, ID band Patient awake    Reviewed: Allergy & Precautions, NPO status , Patient's Chart, lab work & pertinent test results  History of Anesthesia Complications Negative for: history of anesthetic complications  Airway Mallampati: III  TM Distance: >3 FB Neck ROM: Full    Dental no notable dental hx. (+) Dental Advisory Given   Pulmonary neg pulmonary ROS,    Pulmonary exam normal breath sounds clear to auscultation       Cardiovascular hypertension, Pt. on medications and Pt. on home beta blockers +CHF  Normal cardiovascular exam Rhythm:Regular Rate:Normal  Echo 2016: Left ventricle: The cavity size was normal. Wall thickness was  normal. Systolic function was mildly to moderately reduced. The  estimated ejection fraction was in the range of 40% to 45%.  Diffuse hypokinesis. - Mitral valve: There was moderate regurgitation. - Left atrium: The atrium was mildly dilated. - Right atrium: The atrium was mildly dilated. - Pulmonary arteries: PA peak pressure: 35 mm Hg (S).   Neuro/Psych negative neurological ROS  negative psych ROS   GI/Hepatic negative GI ROS, Neg liver ROS,   Endo/Other  obesity  Renal/GU negative Renal ROS  negative genitourinary   Musculoskeletal  (+) Arthritis ,   Abdominal   Peds negative pediatric ROS (+)  Hematology  (+) Blood dyscrasia (thrombocytopenia), anemia ,   Anesthesia Other Findings   Reproductive/Obstetrics negative OB ROS                             Anesthesia Physical Anesthesia Plan  ASA: III  Anesthesia Plan: General   Post-op Pain Management:    Induction: Intravenous  Airway Management Planned: Mask and LMA  Additional Equipment:   Intra-op Plan:   Post-operative Plan: Extubation in OR  Informed Consent: I have reviewed the patients History and Physical, chart, labs  and discussed the procedure including the risks, benefits and alternatives for the proposed anesthesia with the patient or authorized representative who has indicated his/her understanding and acceptance.   Dental advisory given  Plan Discussed with: CRNA  Anesthesia Plan Comments:         Anesthesia Quick Evaluation

## 2015-08-13 NOTE — Consult Note (Signed)
Thomas Nakayama, MD  Thomas Cruel, PA-C  Thomas Dolly, PA-C                                  [facility]                [address], [place], [place]  [address]   ORTHOPAEDIC CONSULTATION  Thomas [name]            MRN:  [id number] DOB/SEX:  1950/10/08/male     CHIEF COMPLAINT:  Painful right THR  HISTORY: Thomas Phillipsis a 65 y.o. male with history of THR by Dr Mayer Camel in January which has dislocated on three occasions. Came out over weekend and put in by EDP. Never felt right and came out again yesterday. EDP tried again last night unsuccessfully.  Admitted.   PAST MEDICAL HISTORY: Patient Active Problem List   Diagnosis Date Noted  . Recurrent dislocation of hip 08/13/2015  . Recurrent dislocation of hip joint prosthesis ([place]) 08/12/2015  . Edema extremities 07/02/2015  . Primary osteoarthritis of right hip 05/18/2015  . Non compliance w medication regimen 02/25/2015  . Right hip pain 02/25/2015  . Lead toxicity 10/19/2013  . Hypertension   . Personal history of colonic polyps - adenomas 08/21/2013  . Portal hypertensive gastropathy 08/21/2013  . DCM (dilated cardiomyopathy) ([place]) 08/19/2013  . Chronic systolic CHF (congestive heart failure), NYHA class 2 ([place]) 08/19/2013  . Alcoholic liver disease ([place]) 08/18/2013  . Chronic atrial fibrillation ([place]) 08/17/2013  . Pancytopenia ([place]) 08/17/2013  . ETOH abuse 08/17/2013   Past Medical History  Diagnosis Date  . Obesity   . Arthritis   . Hypertension   . Chronic systolic CHF (congestive heart failure), NYHA class 2 ([place])   . DCM (dilated cardiomyopathy) ([place])     EF 30% but now improved to 40-45% by echo 2016 on medical therapy with moderate MR  . Pancytopenia ([place])   . ETOH abuse   . Alcoholic liver disease ([place])   . Gastroduodenitis     H Pylori positive  . Chronic atrial fibrillation ([name])     no an anticoagulation canditate due to alcohol abuse, liver cirrhosis with pancytopenia, medical  noncompliance  . Diverticulosis of colon   . Portal hypertensive gastropathy   . Personal history of colonic polyps - adenomas 08/21/2013  . Helicobacter pylori gastritis 10/19/2013  . Edema extremities 07/02/2015   Past Surgical History  Procedure Laterality Date  . Esophagogastroduodenoscopy N/A 08/21/2013    Procedure: ESOPHAGOGASTRODUODENOSCOPY (EGD);  Surgeon: Jerene Bears, MD;  Location: [facility] ENDOSCOPY;  Service: Endoscopy;  Laterality: N/A;  . Colonoscopy N/A 08/21/2013    Procedure: COLONOSCOPY;  Surgeon: Jerene Bears, MD;  Location: [facility] ENDOSCOPY;  Service: Endoscopy;  Laterality: N/A;  . Total hip arthroplasty Right 05/20/2015    Procedure: TOTAL HIP ARTHROPLASTY;  Surgeon: Frederik Pear, MD;  Location: [facility];  Service: Orthopedics;  Laterality: Right;     MEDICATIONS:   Current facility-administered medications:  .  0.9 %  sodium chloride infusion, , Intravenous, Continuous, Norval Morton, MD, Last Rate: 75 mL/hr at 08/13/15 0118 .  acetaminophen (TYLENOL) tablet 650 mg, 650 mg, Oral, Q6H PRN **OR** acetaminophen (TYLENOL) suppository 650 mg, 650 mg, Rectal, Q6H PRN, Norval Morton, MD .  albuterol (PROVENTIL) (2.5 MG/3ML) 0.083% nebulizer solution 2.5 mg, 2.5 mg, Nebulization, Q2H PRN, Norval Morton, MD .  aspirin EC tablet 325 mg, 325  mg, Oral, BID, Norval Morton, MD, 325 mg at 08/13/15 0119 .  carvedilol (COREG) tablet 12.5 mg, 12.5 mg, Oral, BID WC, Rondell A Smith, MD .  enoxaparin (LOVENOX) injection 40 mg, 40 mg, Subcutaneous, Q24H, Rondell A Smith, MD .  folic acid (FOLVITE) tablet 1 mg, 1 mg, Oral, Daily, Rondell A Tamala Julian, MD .  HYDROcodone-acetaminophen (NORCO/VICODIN) 5-325 MG per tablet 1-2 tablet, 1-2 tablet, Oral, Q4H PRN, Delos Haring, PA-C .  lidocaine (LIDODERM) 5 % 1 patch, 1 patch, Transdermal, BID PRN, Norval Morton, MD .  losartan (COZAAR) tablet 12.5 mg, 12.5 mg, Oral, Daily, Rondell A Tamala Julian, MD .  methocarbamol (ROBAXIN) tablet 500 mg, 500 mg, Oral, BID WC,  Rondell A Smith, MD .  morphine 4 MG/ML injection 4 mg, 4 mg, Intravenous, Q2H PRN, Delos Haring, PA-C .  multivitamin with minerals tablet 1 tablet, 1 tablet, Oral, Daily, Rondell A Smith, MD .  ondansetron (ZOFRAN) injection 4 mg, 4 mg, Intravenous, Q8H PRN, Delos Haring, PA-C .  potassium chloride SA (K-DUR,KLOR-CON) CR tablet 40 mEq, 40 mEq, Oral, Daily, Rondell A Smith, MD .  thiamine (VITAMIN B-1) tablet 100 mg, 100 mg, Oral, Daily, Rondell Charmayne Sheer, MD  ALLERGIES:  No Known Allergies  REVIEW OF SYSTEMS: REVIEWED IN DETAIL IN CHART  FAMILY HISTORY:   Family History  Problem Relation Age of Onset  . Hypertension Father     SOCIAL HISTORY:   Social History  Substance Use Topics  . Smoking status: Never Smoker   . Smokeless tobacco: Never Used  . Alcohol Use: 4.8 oz/week    8 Cans of beer per week     Comment: former ETOH abuse      EXAMINATION: Vital signs in last 24 hours: Temp:  [98.1 F (36.7 C)-98.3 F (36.8 C)] 98.3 F (36.8 C) (04/25 0359) Pulse Rate:  [86-115] 93 (04/25 0359) Resp:  [12-22] 18 (04/25 0359) BP: (90-126)/(50-80) 98/58 mmHg (04/25 0359) SpO2:  [98 %-100 %] 98 % (04/25 0359) Weight:  [285 lb (129.275 kg)] 285 lb (129.275 kg) (04/24 1628)  BP 98/58 mmHg  Pulse 93  Temp(Src) 98.3 F (36.8 C) (Oral)  Resp 18  Ht 6' (1.829 m)  Wt 285 lb (129.275 kg)  BMI 38.64 kg/m2  SpO2 98%  General Appearance:    Alert, cooperative, no distress, appears stated age  Head:    Normocephalic, without obvious abnormality, atraumatic  Eyes:    PERRL, conjunctiva/corneas clear, EOM's intact, fundi    benign, both eyes       Ears:    Normal TM's and external ear canals, both ears  Nose:   Nares normal, septum midline, mucosa normal, no drainage    or sinus tenderness  Throat:   Lips, mucosa, and tongue normal; teeth and gums normal  Neck:   Supple, symmetrical, trachea midline, no adenopathy;       thyroid:  No enlargement/tenderness/nodules; no carotid    bruit or JVD  Back:     Symmetric, no curvature, ROM normal, no CVA tenderness  Lungs:     Clear to auscultation bilaterally, respirations unlabored  Chest wall:    No tenderness or deformity  Heart:    Regular rate and rhythm, S1 and S2 normal, no murmur, rub   or gallop  Abdomen:     Soft, non-tender, bowel sounds active all four quadrants,    no masses, no organomegaly  Genitalia:    Rectal:    Extremities:   Extremities normal, atraumatic,  no cyanosis or edema  Pulses:   2+ and symmetric all extremities  Skin:   Skin color, texture, turgor normal, no rashes or lesions  Lymph nodes:   Cervical, supraclavicular, and axillary nodes normal  Neurologic:   CNII-XII intact. Normal strength, sensation and reflexes      throughout    Musculoskeletal Exam:   Right leg SER   DIAGNOSTIC STUDIES: Recent laboratory studies:  Recent Labs  08/12/15 2242 08/13/15 0452  WBC 10.2 8.0  HGB 8.8* 7.9*  HCT 26.7* 23.9*  PLT 110* 106*    Recent Labs  08/12/15 2242 08/13/15 0452  NA 137 135  K 3.9 3.4*  CL 101 100*  CO2 26 25  BUN 18 17  CREATININE 1.26* 1.17  GLUCOSE 101* 114*  CALCIUM 8.7* 8.3*   Lab Results  Component Value Date   INR 1.33 08/13/2015   INR 1.19 05/09/2015   INR 1.13 02/28/2015     Recent Radiographic Studies :  Dg Hip Unilat With Pelvis 1v Right  08/10/2015  CLINICAL DATA:  Right hip deformity after fall EXAM: DG HIP (WITH OR WITHOUT PELVIS) 1V RIGHT COMPARISON:  06/12/2015 right hip radiographs FINDINGS: Right total hip arthroplasty. There is anterior/ superior dislocation of the right femoral head prosthesis at the right hip joint. No hardware fracture or loosening. No acute osseous fracture. No focal osseous lesion. No pelvic diastasis. IMPRESSION: Anterior superior dislocation of the right femoral head prosthesis at the right hip joint. No hardware or osseous fracture. Electronically Signed   By: Ilona Sorrel M.D.   On: 08/10/2015 17:10   Dg Hip Port Unilat  With Pelvis 1v Right  08/12/2015  CLINICAL DATA:  Postreduction right hip.  Initial encounter. EXAM: DG HIP (WITH OR WITHOUT PELVIS) 1V PORT RIGHT COMPARISON:  Earlier today FINDINGS: Total right hip arthroplasty remains superiorly dislocated. No visible periprosthetic fracture. IMPRESSION: Persistent superior dislocation of the prosthetic right hip. Electronically Signed   By: Monte Fantasia M.D.   On: 08/12/2015 22:32   Dg Hip Port Unilat With Pelvis 1v Right  08/10/2015  CLINICAL DATA:  Post reduction EXAM: DG HIP (WITH OR WITHOUT PELVIS) 1V PORT RIGHT COMPARISON:  None. FINDINGS: Interval reduction of the right hip arthroplasty. Mild degenerative changes of the left hip. Visualized bony pelvis appears intact. Degenerative changes of the lower lumbar spine. IMPRESSION: Interval reduction of the right hip arthroplasty. Electronically Signed   By: Julian Hy M.D.   On: 08/10/2015 23:29   Dg Hip Unilat With Pelvis 2-3 Views Right  08/12/2015  CLINICAL DATA:  Right hip pain and limited range of motion after falling 2 days ago. Right total hip arthroplasty 3 months ago. EXAM: DG HIP (WITH OR WITHOUT PELVIS) 2-3V RIGHT COMPARISON:  Radiographs 08/10/2015. FINDINGS: There is no lateral view for the current study. Patient is status post right total hip arthroplasty. There is recurrent dislocation of the prosthesis with the femoral head located superolateral to the acetabulum. No evidence of acute fracture. Heterotopic ossification is noted around the right hip and ischium. There are stable degenerative changes of the left hip and lower lumbar spine. IMPRESSION: Recurrent dislocation of the right total hip arthroplasty without evidence of associated acute fracture. Electronically Signed   By: Richardean Sale M.D.   On: 08/12/2015 17:15    ASSESSMENT:  Dislocated right THR   PLAN:  Plan CR in OR today.  If unstable will need constrained liner by Dr Mayer Camel later in admission.  Thomas [name]  G 08/13/2015,  7:08 AM

## 2015-08-13 NOTE — Interval H&P Note (Signed)
OK for surgery PD 

## 2015-08-13 NOTE — Op Note (Signed)
Thomas [name], EPLIN             ACCOUNT NO.:  [id number]  MEDICAL RECORD NO.:  [id number]  LOCATION:  [facility]                         FACILITY:  [facility]  PHYSICIAN:  Monico Blitz. Christie Copley, M.D.DATE OF BIRTH:  02/04/1951  DATE OF PROCEDURE:  08/13/2015 DATE OF DISCHARGE:                              OPERATIVE REPORT   PREOPERATIVE DIAGNOSIS:  Right total hip replacement dislocation.  POSTOPERATIVE DIAGNOSIS:  Right total hip replacement dislocation.  PROCEDURE:  Closed reduction of right total hip dislocation.  ANESTHESIA:  General mask.  ATTENDING SURGEON:  Monico Blitz. Rhona Raider, M.D.  ASSISTANT:  Loni Dolly, PA.  INDICATION FOR PROCEDURE:  The patient is a 65 year old man who is several months from a hip replacement done through a posterior approach. He has had instability on several occasions most recently just this past weekend.  Unfortunately, he was admitted back last night with the same thing having suffered a recurrent dislocation.  He is offered another closed reduction, and understands that this is not stable and he may require revision operation.  Informed operative consent was obtained after discussion of possible complications including reaction to anesthesia and recurrent instability.  SUMMARY OF FINDINGS AND PROCEDURE:  Under general anesthesia without intubation, a closed reduction of the right hip was performed.  This was relatively difficult and was confirmed under fluoroscopy to be in place. He was then placed in a knee immobilizer and admitted back to the floor.  DESCRIPTION OF PROCEDURE:  The patient was taken to the operating suite where general anesthetic was applied without difficulty.  He was positioned on an OR table.  After significant relaxation was given, we flexed the hip to 90 degrees and applied some traction with rotation. His hip reduced after several attempts.  Fluoroscopy was used to confirm adequacy of reduction.  I then flexed his hip again to 60  or 70 degrees and the reduction seemed to be fairly stable.  I again got a fluoroscopic view to confirm that it was in place.  He was then placed in a knee immobilizer on this side.  DISPOSITION:  The patient was taken to recovery room in stable condition.  Plans were for him to be admitted back to the Medicine Service for appropriate management.  We will order a custom brace and Dr. Mayer Camel will resume his care at this point.     Monico Blitz Rhona Raider, M.D.     PGD/MEDQ  D:  08/13/2015  T:  08/13/2015  Job:  [id number]

## 2015-08-14 DIAGNOSIS — N179 Acute kidney failure, unspecified: Secondary | ICD-10-CM | POA: Diagnosis not present

## 2015-08-14 DIAGNOSIS — S73004D Unspecified dislocation of right hip, subsequent encounter: Secondary | ICD-10-CM

## 2015-08-14 DIAGNOSIS — F1010 Alcohol abuse, uncomplicated: Secondary | ICD-10-CM | POA: Diagnosis not present

## 2015-08-14 DIAGNOSIS — D61818 Other pancytopenia: Secondary | ICD-10-CM

## 2015-08-14 DIAGNOSIS — I482 Chronic atrial fibrillation: Secondary | ICD-10-CM | POA: Diagnosis not present

## 2015-08-14 DIAGNOSIS — M24451 Recurrent dislocation, right hip: Secondary | ICD-10-CM | POA: Diagnosis not present

## 2015-08-14 DIAGNOSIS — S73004A Unspecified dislocation of right hip, initial encounter: Secondary | ICD-10-CM | POA: Insufficient documentation

## 2015-08-14 LAB — CBC
HEMATOCRIT: 25.3 % — AB (ref 39.0–52.0)
HEMOGLOBIN: 8.1 g/dL — AB (ref 13.0–17.0)
MCH: 31.3 pg (ref 26.0–34.0)
MCHC: 32 g/dL (ref 30.0–36.0)
MCV: 97.7 fL (ref 78.0–100.0)
Platelets: 127 10*3/uL — ABNORMAL LOW (ref 150–400)
RBC: 2.59 MIL/uL — ABNORMAL LOW (ref 4.22–5.81)
RDW: 15.8 % — AB (ref 11.5–15.5)
WBC: 7.4 10*3/uL (ref 4.0–10.5)

## 2015-08-14 LAB — BASIC METABOLIC PANEL
ANION GAP: 8 (ref 5–15)
BUN: 13 mg/dL (ref 6–20)
CHLORIDE: 104 mmol/L (ref 101–111)
CO2: 27 mmol/L (ref 22–32)
Calcium: 8.6 mg/dL — ABNORMAL LOW (ref 8.9–10.3)
Creatinine, Ser: 0.93 mg/dL (ref 0.61–1.24)
GFR calc non Af Amer: >60 mL/min (ref >60)
Glucose, Bld: 106 mg/dL — ABNORMAL HIGH (ref 65–99)
POTASSIUM: 3.7 mmol/L (ref 3.5–5.1)
Sodium: 139 mmol/L (ref 135–145)

## 2015-08-14 LAB — MAGNESIUM: MAGNESIUM: 1.9 mg/dL (ref 1.7–2.4)

## 2015-08-14 MED ORDER — FUROSEMIDE 40 MG PO TABS: 40.0000 mg | ORAL_TABLET | Freq: Every day | ORAL | Status: DC

## 2015-08-14 MED ORDER — CARVEDILOL 12.5 MG PO TABS
12.5000 mg | ORAL_TABLET | Freq: Two times a day (BID) | ORAL | Status: DC
  Administered 2015-08-14: 12.5 mg via ORAL
  Filled 2015-08-14: qty 1

## 2015-08-14 NOTE — Progress Notes (Addendum)
PATIENT ID: Thomas [name]  MRN: [id number]  DOB/AGE:  [birth year] / 65 y.o.  1 Day Post-Op Procedure(s) (LRB): CLOSED REDUCTION HIP (Right)    PROGRESS NOTE Subjective: Patient is alert, oriented, no Nausea, no Vomiting, yes passing gas, . Taking PO well. Denies SOB, Chest or Calf Pain. Using Incentive Spirometer, PAS in place. Ambulate WBAT with hinged abduction brace.  Pt walked to bathroom and in hall. Patient reports pain as  0/10  .    Objective: Vital signs in last 24 hours: Filed Vitals:   08/14/15 0043 08/14/15 0400 08/14/15 0500 08/14/15 0706  BP: 113/70 120/65  135/80  Pulse: 86 90  88  Temp:  99.1 F (37.3 C)    TempSrc:  Oral    Resp:  20    Height:      Weight:   142.2 kg (313 lb 7.9 oz)   SpO2:  99%        Intake/Output from previous day: I/O last 3 completed shifts: In: 592.5 [P.O.:240; I.V.:352.5] Out: 650 [Urine:650]   Intake/Output this shift:     LABORATORY DATA:  Recent Labs  08/13/15 0137 08/13/15 0452 08/14/15 0317  WBC  --  8.0 7.4  HGB  --  7.9* 8.1*  HCT  --  23.9* 25.3*  PLT  --  106* 127*  NA  --  135 139  K  --  3.4* 3.7  CL  --  100* 104  CO2  --  25 27  BUN  --  17 13  CREATININE  --  1.17 0.93  GLUCOSE  --  114* 106*  INR 1.33  --   --   CALCIUM  --  8.3* 8.6*    Examination: Neurologically intact Neurovascular intact Sensation intact distally Intact pulses distally Dorsiflexion/Plantar flexion intact Compartment soft} XR AP&Lat of hip shows well placed\fixed THA  Assessment:   1 Day Post-Op Procedure(s) (LRB): CLOSED REDUCTION HIP (Right) ADDITIONAL DIAGNOSIS:     Plan: PT/OT WBAT, with hinged abduction brace, Strict posterior precautions    DISCHARGE PLAN: Home.  If we need to put in D/C orders, nursing staff will call.  [place] for Discharge from ortho standpoint.  DISCHARGE NEEDS: Pt has the hinged abduction brace that was fitted by [employer].

## 2015-08-14 NOTE — Progress Notes (Signed)
PROGRESS NOTE  Thomas [name] [id number] DOB: [birth year] DOA: 08/12/2015 PCP: Hoyt Koch, MD  Specialists: Dr.ROwan  Narrative:Thomas [name] is a 65 y.o. male with medical history significant of pancytopenia, dilated cardiomyopathy, chronic systolic CHF, HTN, obesity, chronic atrial fibrillation not on anticoagulation, s/p right hip replacement in 04/2015 by Dr. Mayer Camel; who presents with complaints of right hip pain. Came out over weekend and put in by EDP. Never felt right and came out again 4/24. EDP tried again last night unsuccessfully. Admitted. S/p closed reduction by Dr.Dalldorf last afternoon PT eval pending  Subjective: Feels ok, not seen by PT< leg swelling, no dyspnea  Assessment/Plan: Recurrent dislocation of hip joint prosthesis: - Unsuccessful relocation of right hip in ED. Orthopedics consulted for further management. - s/p Closed reduction of right total hip dislocation by Dr.Dalldorf yetserday afternoon - on ASA 325mg  BID for Dvt proph per Ortho -PT eval requested  Essential hypertension - bp was low normal, resume Coreg and hold losartan  -consider resume home meds if bp stable at discharge.  Systolic CHF: last EF [id number] in 02/2015 - does has1plus pitting edema noted on the bilateral lower extremities, lung clear - continue Coreg, resume Po lasix  Chronic atrial fibrillation: - It appears patient was deemed a poor anticoagulation candidate secondary to alcohol abuse. He is on asa 325 BID at home. - Continue Coreg   Acute kidney injury: Baseline creatinine appears to be less than 1. However, on admission patient's creatinine elevated at 1.26 with a BUN 18. T -resolved, ACE on hold -Stop IVF  Pancytopenia w/Thrombocytopenia and anemia: Patient's baseline hemoglobin appears to run around 8-9 g/dL.  platelet counts seem to run around 100. - Continue to monitor  -needs workup for this as outpatient, Alcoholism could be contributing to  this  Alcohol abuse - CWIAA protocols - Continue folic acid and thiamine -no withdrawal noted  DVT prophylaxis: Per ORtho on ASA BID Code Status: Full  Family Communication: None  Disposition Plan: home tomorrow if stable  Consults called: Dr. Mayer Camel orthopedics    Procedures:  Closed reduction on 4/25  Antibiotics:  none   Objective: BP 135/80 mmHg  Pulse 88  Temp(Src) 99.1 F (37.3 C) (Oral)  Resp 20  Ht 6' (1.829 m)  Wt 142.2 kg (313 lb 7.9 oz)  BMI 42.51 kg/m2  SpO2 99%  Intake/Output Summary (Last 24 hours) at 08/14/15 1304 Last data filed at 08/13/15 1500  Gross per 24 hour  Intake    240 ml  Output      0 ml  Net    240 ml   Filed Weights   08/12/15 1628 08/13/15 2201 08/14/15 0500  Weight: 129.275 kg (285 lb) 142.2 kg (313 lb 7.9 oz) 142.2 kg (313 lb 7.9 oz)    Exam:   General:  NAD  Cardiovascular: IRRR  Respiratory: CTABL  Abdomen: Soft/ND/NT, positive BS  Musculoskeletal: right leg in immobilizer, neurovascularly intact distally, left leg mild pitting edema  Neuro: aaox3  Data Reviewed: Basic Metabolic Panel:  Recent Labs Lab 08/12/15 2242 08/13/15 0452 08/14/15 0317  NA 137 135 139  K 3.9 3.4* 3.7  CL 101 100* 104  CO2 26 25 27   GLUCOSE 101* 114* 106*  BUN 18 17 13   CREATININE 1.26* 1.17 0.93  CALCIUM 8.7* 8.3* 8.6*  MG  --   --  1.9   Liver Function Tests: No results for input(s): AST, ALT, ALKPHOS, BILITOT, PROT, ALBUMIN in the last 168 hours. No results for  input(s): LIPASE, AMYLASE in the last 168 hours. No results for input(s): AMMONIA in the last 168 hours. CBC:  Recent Labs Lab 08/12/15 2242 08/13/15 0452 08/14/15 0317  WBC 10.2 8.0 7.4  NEUTROABS 7.2  --   --   HGB 8.8* 7.9* 8.1*  HCT 26.7* 23.9* 25.3*  MCV 96.0 96.0 97.7  PLT 110* 106* 127*   Cardiac Enzymes:   No results for input(s): CKTOTAL, CKMB, CKMBINDEX, TROPONINI in the last 168 hours. BNP (last 3 results)  Recent Labs  02/25/15 1150  08/13/15 0137  BNP 236.2* 130.2*    ProBNP (last 3 results) No results for input(s): PROBNP in the last 8760 hours.  CBG: No results for input(s): GLUCAP in the last 168 hours.  Recent Results (from the past 240 hour(s))  Surgical pcr screen     Status: None   Collection Time: 08/13/15  1:28 AM  Result Value Ref Range Status   MRSA, PCR NEGATIVE NEGATIVE Final   Staphylococcus aureus NEGATIVE NEGATIVE Final    Comment:        The Xpert SA Assay (FDA approved for NASAL specimens in patients over 46 years of age), is one component of a comprehensive surveillance program.  Test performance has been validated by [facility] for patients greater than or equal to 65 year old. It is not intended to diagnose infection nor to guide or monitor treatment.      Studies: Dg C-arm 1-60 Min-no Report  08/13/2015  CLINICAL DATA: closed reduction right hip C-ARM 1-60 MINUTES Fluoroscopy was utilized by the requesting physician.  No radiographic interpretation.   Dg Hip Unilat With Pelvis 1v Right  08/13/2015  CLINICAL DATA:  Intraoperative imaging of closed reduction of right hip dislocation. Initial encounter. EXAM: DG HIP (WITH OR WITHOUT PELVIS) 1V RIGHT; DG C-ARM 1-60 MIN-NO REPORT COMPARISON:  Plain films right hip 08/12/2015. FINDINGS: Right total hip arthroplasty is identified. The femoral head has been reduced. No new abnormality. IMPRESSION: Successful reduction of right hip dislocation. Electronically Signed   By: Inge Rise M.D.   On: 08/13/2015 13:44    Scheduled Meds: . aspirin EC  325 mg Oral BID  . folic acid  1 mg Oral Daily  . methocarbamol  500 mg Oral BID WC  . metoprolol tartrate  12.5 mg Oral BID  . multivitamin with minerals  1 tablet Oral Daily  . potassium chloride SA  40 mEq Oral Daily  . thiamine  100 mg Oral Daily    Continuous Infusions:     Time spent: 9mins  Thomas Fraley MD, PhD  [facility] Pager [phone]. If 7PM-7AM, please  contact night-coverage at [email or web address], password [facility] 08/14/2015, 1:04 PM  LOS: 2 days

## 2015-08-14 NOTE — Evaluation (Signed)
Physical Therapy Evaluation Patient Details Name: Thomas [name] MRN: [id number] DOB: 1950-09-23 Today's Date: 08/14/2015   History of Present Illness  65 y.o. male admitted to [facility] on 08/12/15 for recurrent R hip dislocation.  This is pt's third dislocation since surgery.  Pt with significant PMHx of obesity, HTN, CHF, ETOH abuse, alcoholic liver disease, chronic A-fib, edema in extremities, original R THA 05/20/15.  Clinical Impression  Pt is POD #1 and is moving well, overall at Mod I level. He is weak on the right and needs reinforcement of hip precautions and avoidance of low chairs.  It seems as though he is dislocating when he is flexing forward over that hip (leaning forward to get TED hose on and sit<->stand x 2).  He could only recall 1/3 posterior hip precautions with me today.  Handout given for reinforcement.   PT to follow acutely for deficits listed below.       Follow Up Recommendations Other (comment) (defer f/u PT to ortho MDs--at f/u)    Equipment Recommendations  None recommended by PT    Recommendations for Other Services   NA    Precautions / Restrictions Precautions Precautions: Posterior Hip;Fall Precaution Comments: Pt only able to report 1/3 posteriror hip precautions.  Reinforced avoiding low chairs at home Required Braces or Orthoses: Other Brace/Splint Other Brace/Splint: hip abduction brace Restrictions Weight Bearing Restrictions: Yes RLE Weight Bearing: Weight bearing as tolerated      Mobility  Bed Mobility               General bed mobility comments: Pt seated in low recliner chair- educated him on avoiding low chairs and kicking his leg out in front of him and using his arms to slowly sit down.    Transfers Overall transfer level: Needs assistance Equipment used: Rolling walker (2 wheeled) Transfers: Sit to/from Stand Sit to Stand: Modified independent (Device/Increase time)         General transfer comment: Verbal cues for safe hand  placement and foot placement to go to sitting.  Ambulation/Gait Ambulation/Gait assistance: Modified independent (Device/Increase time) Ambulation Distance (Feet): 220 Feet Assistive device: Rolling walker (2 wheeled) Gait Pattern/deviations: Step-through pattern     General Gait Details: Pt already had brace donned in sitting.  He has good smooth gait pattern, heel to toe with very mildly antalgic gait pattern.         Balance Overall balance assessment: Needs assistance Sitting-balance support: Feet supported;No upper extremity supported Sitting balance-Leahy Scale: Good     Standing balance support: Bilateral upper extremity supported;Single extremity supported;No upper extremity supported Standing balance-Leahy Scale: Fair                               Pertinent Vitals/Pain Pain Assessment: No/denies pain    Home Living Family/patient expects to be discharged to:: Private residence Living Arrangements: Parent Available Help at Discharge: Family;Available 24 hours/day Type of Home: House Home Access: Stairs to enter Entrance Stairs-Rails: Right Entrance Stairs-Number of Steps: 2 Home Layout: One level Home Equipment: Walker - 2 wheels;Cane - single point;Bedside commode;Shower seat;Grab bars - tub/shower;Hand held shower head;Adaptive equipment Additional Comments: Pt reports he was at a rehab facility in December    Prior Function Level of Independence: Independent         Comments: was doing OP PT     Hand Dominance   Dominant Hand: Right    Extremity/Trunk Assessment   Upper Extremity  Assessment: Overall WFL for tasks assessed           Lower Extremity Assessment: RLE deficits/detail RLE Deficits / Details: right leg with no reports of pain, no signs of buckling or weakness, however, significant fluid pocket posteriorly that is bulging under his hip abduction brace.      Cervical / Trunk Assessment: Normal  Communication    Communication: No difficulties  Cognition Arousal/Alertness: Awake/alert Behavior During Therapy: WFL for tasks assessed/performed Overall Cognitive Status: Within Functional Limits for tasks assessed                               Assessment/Plan    PT Assessment Patient needs continued PT services  PT Diagnosis Difficulty walking;Abnormality of gait;Generalized weakness;Acute pain   PT Problem List Decreased strength;Decreased mobility;Decreased knowledge of precautions;Obesity  PT Treatment Interventions DME instruction;Gait training;Stair training;Functional mobility training;Therapeutic activities;Therapeutic exercise;Balance training;Neuromuscular re-education;Patient/family education;Modalities   PT Goals (Current goals can be found in the Care Plan section) Acute Rehab PT Goals Patient Stated Goal: to leave today PT Goal Formulation: With patient Time For Goal Achievement: 08/21/15 Potential to Achieve Goals: Good    Frequency 7X/week           End of Session Equipment Utilized During Treatment: Other (comment) (hip abduction brace) Activity Tolerance: Patient tolerated treatment well Patient left: in chair;with call bell/phone within reach Nurse Communication: Mobility status    Functional Assessment Tool Used: assist level Functional Limitation: Mobility: Walking and moving around Mobility: Walking and Moving Around Current Status [id number]): At least 1 percent but less than 20 percent impaired, limited or restricted Mobility: Walking and Moving Around Goal Status [phone]): At least 1 percent but less than 20 percent impaired, limited or restricted Mobility: Walking and Moving Around Discharge Status [phone]): At least 1 percent but less than 20 percent impaired, limited or restricted    Time: [id number] PT Time Calculation (min) (ACUTE ONLY): 10 min   Charges:   PT Evaluation $PT Eval Low Complexity: 1 Procedure     PT G Codes:   PT G-Codes **NOT FOR  INPATIENT CLASS** Functional Assessment Tool Used: assist level Functional Limitation: Mobility: Walking and moving around Mobility: Walking and Moving Around Current Status [id number]): At least 1 percent but less than 20 percent impaired, limited or restricted Mobility: Walking and Moving Around Goal Status [phone]): At least 1 percent but less than 20 percent impaired, limited or restricted Mobility: Walking and Moving Around Discharge Status [phone]): At least 1 percent but less than 20 percent impaired, limited or restricted    Fillmore Bynum B. [place], [place], DPT [phone]   08/14/2015, 5:24 PM

## 2015-08-15 ENCOUNTER — Encounter (HOSPITAL_COMMUNITY): Payer: Self-pay | Admitting: Orthopaedic Surgery

## 2015-08-17 NOTE — Discharge Summary (Signed)
Physician Discharge Summary  Thomas [name] [id number] DOB: January 25, 1951 DOA: 08/12/2015  PCP: Hoyt Koch, MD  Admit date: 08/12/2015 Discharge date: 08/14/2015  Time spent: 45 minutes  Recommendations for Outpatient Follow-up:  1. Dr.ROwan in 1-2weeks, may need Outpt PT depending on Ortho FU   Discharge Diagnoses:  Principal Problem:   Recurrent dislocation of hip Active Problems:   Chronic atrial fibrillation ([name])   Pancytopenia ([name])   ETOH abuse   Acute kidney injury ([place])   Hip dislocation, right ([place])   Discharge Condition:stable  Diet recommendation: heart healthy  Filed Weights   08/12/15 1628 08/13/15 2201 08/14/15 0500  Weight: 129.275 kg (285 lb) 142.2 kg (313 lb 7.9 oz) 142.2 kg (313 lb 7.9 oz)    History of present illness:  Thomas [name] is a 65 y.o. male with medical history significant of pancytopenia, dilated cardiomyopathy, chronic systolic CHF, HTN, obesity, chronic atrial fibrillation not on anticoagulation, s/p right hip replacement in 04/2015 by Dr. Mayer Camel; who presents with complaints of right hip pain. Came out over weekend and put in by EDP. Never felt right and came out again 4/24. EDP tried again last night unsuccessfully. Admitted  Hospital Course:  Recurrent dislocation of hip joint prosthesis: - Unsuccessful relocation of right hip in ED. Orthopedics consulted for further management. - s/p Closed reduction of right total hip dislocation by Dr.Dalldorf yetserday afternoon -now stable and improved -PT eval completed, Out PT depending on Ortho FU  Essential hypertension -resume Coreg and held losartan initially and then resumed  Systolic CHF: last EF [id number] in 02/2015 - does has1plus pitting edema noted on the bilateral lower extremities, lung clear - continue Coreg, resume Po lasix  Chronic atrial fibrillation: - It appears patient was deemed a poor anticoagulation candidate secondary to alcohol abuse. He is on asa 325 BID at  home. - Continue Coreg  Acute kidney injury: Baseline creatinine appears to be less than 1. However, on admission patient's creatinine elevated at 1.26 with a BUN 18. T -resolved, ACE was hold, resumed at DC  Pancytopenia w/Thrombocytopenia and anemia: Patient's baseline hemoglobin appears to run around 8-9 g/dL.  platelet counts seem to run around 100. - Continue to monitor  -needs workup for this as outpatient, Alcoholism could be contributing to this  Alcohol abuse - monitored on [facility] protocols - Continue folic acid and thiamine -no withdrawal noted  Procedures:  Closed reduction on 4/25  Consultations:  Ortho   Discharge Exam: Filed Vitals:   08/14/15 0400 08/14/15 0706  BP: 120/65 135/80  Pulse: 90 88  Temp: 99.1 F (37.3 C)   Resp: 20     General:AAOx3 Cardiovascular: S!S2/RRR Respiratory: CTAB  Discharge Instructions   Discharge Instructions    Diet - low sodium heart healthy    Complete by:  As directed      Increase activity slowly    Complete by:  As directed           Discharge Medication List as of 08/14/2015  5:58 PM    START taking these medications   Details  HYDROcodone-acetaminophen (NORCO/VICODIN) 5-325 MG tablet Take 1-2 tablets by mouth every 4 (four) hours as needed., Starting 08/12/2015, Until Discontinued, Print      CONTINUE these medications which have CHANGED   Details  furosemide (LASIX) 40 MG tablet Take 1 tablet (40 mg total) by mouth daily., Starting 08/14/2015, Until Discontinued, No Print      CONTINUE these medications which have NOT CHANGED   Details  aspirin  EC 325 MG tablet Take 1 tablet (325 mg total) by mouth 2 (two) times daily., Starting 05/20/2015, Until Discontinued, Print    carvedilol (COREG) 12.5 MG tablet Take 1 tablet (12.5 mg total) by mouth 2 (two) times daily with a meal., Starting 07/02/2015, Until Discontinued, Normal    folic acid (FOLVITE) 1 MG tablet Take 1 tablet (1 mg total) by mouth daily.,  Starting 07/31/2015, Until Discontinued, Normal    lidocaine (LIDODERM) 5 % Place 1 patch onto the skin 2 (two) times daily as needed. Remove & Discard patch within 12 hours or as directed by MD, Until Discontinued, Historical Med    losartan (COZAAR) 25 MG tablet Take 0.5 tablets (12.5 mg total) by mouth daily., Starting 07/31/2015, Until Discontinued, Normal    methocarbamol (ROBAXIN) 500 MG tablet Take 1 tablet (500 mg total) by mouth 2 (two) times daily with a meal., Starting 05/20/2015, Until Discontinued, Print    Multiple Vitamin (MULTIVITAMIN WITH MINERALS) TABS tablet Take 1 tablet by mouth daily., Starting 08/24/2013, Until Discontinued, No Print    potassium chloride SA (K-DUR,KLOR-CON) 20 MEQ tablet Take 2 tablets (40 mEq total) by mouth daily., Starting 07/02/2015, Until Discontinued, Normal    thiamine 100 MG tablet Take 1 tablet (100 mg total) by mouth daily., Starting 07/31/2015, Until Discontinued, Normal    traMADol (ULTRAM) 50 MG tablet Take 50 mg by mouth 2 (two) times daily as needed for moderate pain. , Starting 07/04/2015, Until Discontinued, Historical Med      STOP taking these medications     ibuprofen (ADVIL,MOTRIN) 400 MG tablet      oxyCODONE-acetaminophen (PERCOCET/ROXICET) 5-325 MG tablet        No Known Allergies Follow-up Information    Follow up with Kerin Salen, MD. Schedule an appointment as soon as possible for a visit in 2 weeks.   Specialty:  Orthopedic Surgery   Why:  for FU   Contact information:   [place] [address] [phone]        The results of significant diagnostics from this hospitalization (including imaging, microbiology, ancillary and laboratory) are listed below for reference.    Significant Diagnostic Studies: Dg C-arm 1-60 Min-no Report  08/13/2015  CLINICAL DATA: closed reduction right hip C-ARM 1-60 MINUTES Fluoroscopy was utilized by the requesting physician.  No radiographic interpretation.   Dg Hip  Unilat With Pelvis 1v Right  08/13/2015  CLINICAL DATA:  Intraoperative imaging of closed reduction of right hip dislocation. Initial encounter. EXAM: DG HIP (WITH OR WITHOUT PELVIS) 1V RIGHT; DG C-ARM 1-60 MIN-NO REPORT COMPARISON:  Plain films right hip 08/12/2015. FINDINGS: Right total hip arthroplasty is identified. The femoral head has been reduced. No new abnormality. IMPRESSION: Successful reduction of right hip dislocation. Electronically Signed   By: Inge Rise M.D.   On: 08/13/2015 13:44   Dg Hip Unilat With Pelvis 1v Right  08/10/2015  CLINICAL DATA:  Right hip deformity after fall EXAM: DG HIP (WITH OR WITHOUT PELVIS) 1V RIGHT COMPARISON:  06/12/2015 right hip radiographs FINDINGS: Right total hip arthroplasty. There is anterior/ superior dislocation of the right femoral head prosthesis at the right hip joint. No hardware fracture or loosening. No acute osseous fracture. No focal osseous lesion. No pelvic diastasis. IMPRESSION: Anterior superior dislocation of the right femoral head prosthesis at the right hip joint. No hardware or osseous fracture. Electronically Signed   By: Ilona Sorrel M.D.   On: 08/10/2015 17:10   Dg Hip Port Unilat With Pelvis  1v Right  08/12/2015  CLINICAL DATA:  Postreduction right hip.  Initial encounter. EXAM: DG HIP (WITH OR WITHOUT PELVIS) 1V PORT RIGHT COMPARISON:  Earlier today FINDINGS: Total right hip arthroplasty remains superiorly dislocated. No visible periprosthetic fracture. IMPRESSION: Persistent superior dislocation of the prosthetic right hip. Electronically Signed   By: Monte Fantasia M.D.   On: 08/12/2015 22:32   Dg Hip Port Unilat With Pelvis 1v Right  08/10/2015  CLINICAL DATA:  Post reduction EXAM: DG HIP (WITH OR WITHOUT PELVIS) 1V PORT RIGHT COMPARISON:  None. FINDINGS: Interval reduction of the right hip arthroplasty. Mild degenerative changes of the left hip. Visualized bony pelvis appears intact. Degenerative changes of the lower lumbar  spine. IMPRESSION: Interval reduction of the right hip arthroplasty. Electronically Signed   By: Julian Hy M.D.   On: 08/10/2015 23:29   Dg Hip Unilat With Pelvis 2-3 Views Right  08/12/2015  CLINICAL DATA:  Right hip pain and limited range of motion after falling 2 days ago. Right total hip arthroplasty 3 months ago. EXAM: DG HIP (WITH OR WITHOUT PELVIS) 2-3V RIGHT COMPARISON:  Radiographs 08/10/2015. FINDINGS: There is no lateral view for the current study. Patient is status post right total hip arthroplasty. There is recurrent dislocation of the prosthesis with the femoral head located superolateral to the acetabulum. No evidence of acute fracture. Heterotopic ossification is noted around the right hip and ischium. There are stable degenerative changes of the left hip and lower lumbar spine. IMPRESSION: Recurrent dislocation of the right total hip arthroplasty without evidence of associated acute fracture. Electronically Signed   By: Richardean Sale M.D.   On: 08/12/2015 17:15    Microbiology: Recent Results (from the past 240 hour(s))  Surgical pcr screen     Status: None   Collection Time: 08/13/15  1:28 AM  Result Value Ref Range Status   MRSA, PCR NEGATIVE NEGATIVE Final   Staphylococcus aureus NEGATIVE NEGATIVE Final    Comment:        The Xpert SA Assay (FDA approved for NASAL specimens in patients over 90 years of age), is one component of a comprehensive surveillance program.  Test performance has been validated by [facility] for patients greater than or equal to 36 year old. It is not intended to diagnose infection nor to guide or monitor treatment.      Labs: Basic Metabolic Panel:  Recent Labs Lab 08/12/15 2242 08/13/15 0452 08/14/15 0317  NA 137 135 139  K 3.9 3.4* 3.7  CL 101 100* 104  CO2 26 25 27   GLUCOSE 101* 114* 106*  BUN 18 17 13   CREATININE 1.26* 1.17 0.93  CALCIUM 8.7* 8.3* 8.6*  MG  --   --  1.9   Liver Function Tests: No results for  input(s): AST, ALT, ALKPHOS, BILITOT, PROT, ALBUMIN in the last 168 hours. No results for input(s): LIPASE, AMYLASE in the last 168 hours. No results for input(s): AMMONIA in the last 168 hours. CBC:  Recent Labs Lab 08/12/15 2242 08/13/15 0452 08/14/15 0317  WBC 10.2 8.0 7.4  NEUTROABS 7.2  --   --   HGB 8.8* 7.9* 8.1*  HCT 26.7* 23.9* 25.3*  MCV 96.0 96.0 97.7  PLT 110* 106* 127*   Cardiac Enzymes: No results for input(s): CKTOTAL, CKMB, CKMBINDEX, TROPONINI in the last 168 hours. BNP: BNP (last 3 results)  Recent Labs  02/25/15 1150 08/13/15 0137  BNP 236.2* 130.2*    ProBNP (last 3 results) No results for input(s):  PROBNP in the last 8760 hours.  CBG: No results for input(s): GLUCAP in the last 168 hours.     SignedDomenic Polite MD.  [facility] 08/17/2015, 3:37 PM

## 2015-08-19 ENCOUNTER — Other Ambulatory Visit: Payer: Self-pay | Admitting: *Deleted

## 2015-08-19 MED ORDER — FUROSEMIDE 40 MG PO TABS: 40.0000 mg | ORAL_TABLET | Freq: Every day | ORAL | Status: DC

## 2015-08-25 NOTE — Progress Notes (Signed)
Cardiology Office Note   Date:  08/26/2015   ID:  Thomas [name], DOB [birth year], MRN [id number]  PCP:  Thomas Koch, MD  Cardiologist:  Dr. Radford [name]     Chief Complaint  Patient presents with  . Hospitalization Follow-up    and edema  . Chest Pain    pt states no chest pain   . Shortness of Breath    no SOB       History of Present Illness: Thomas [name] is a 65 y.o. male who presents for post hospitalization and follow up visit for edema.    He has a history of EtOH abuse, atrial fibrillation with RVR, pancytopenia secondary to alcoholism, chronic systolic heart failure, and a history of dilated cardiomyopathy secondary to alcohol abuse. He had a Myoview on 08/23/2013 which showed EF 36% with diffuse hypokinesis, suspect nonischemic cardio myopathy, no evidence of ischemia or infarction. Last echo on 02/27/2015 showed EF 40-45%, moderate MR, PA peak pressure 35 mmHg. He was felt not to be a good candidate for an echo question due to his alcoholism and medical noncompliance. Previous CT scan showed alcoholic liver disease with evidence of cirrhosis. He had recent total hip replacement in January followed by a hip dislocation in Feb. He was last seen in office on 07/02/2015, he was noted to have marked right lower extremity edema secondary to recent hip replacement dislocation. He says while his doctor has performed a lower extremity venous Doppler that was negative for DVT. ( per patient, this was just done in Feb) His Lasix was increased to 40 mg twice a day.  On follow up the lasix was continued at higher dose.  Re-hospitalized 4/24-4/26/17 from recurrent dislocation of hip with closed reduction of rt hip.    Today he complains of lower ext edema.  He was off lasix during hospitalization and was discharged on one a day.  Pt admits to taking twice a day and wt is up from last visit.  He does not think he is taking the potassium.  He will look at home.  But we will refill and  refill the lasix.  He denies chest pain or SOB.  No palpitaions.  He has decreased his ETOH some, reminded to watch and not drink much ETOH.    He is wearing a brace on his rt side for hip displacement.  He states he feel great otherwise.     Past Medical History  Diagnosis Date  . Obesity   . Arthritis   . Hypertension   . Chronic systolic CHF (congestive heart failure), NYHA class 2 ([place])   . DCM (dilated cardiomyopathy) ([place])     EF 30% but now improved to 40-45% by echo 2016 on medical therapy with moderate MR  . Pancytopenia ([place])   . ETOH abuse   . Alcoholic liver disease ([place])   . Gastroduodenitis     H Pylori positive  . Chronic atrial fibrillation ([name])     no an anticoagulation canditate due to alcohol abuse, liver cirrhosis with pancytopenia, medical noncompliance  . Diverticulosis of colon   . Portal hypertensive gastropathy   . Personal history of colonic polyps - adenomas 08/21/2013  . Helicobacter pylori gastritis 10/19/2013  . Edema extremities 07/02/2015    Past Surgical History  Procedure Laterality Date  . Esophagogastroduodenoscopy N/A 08/21/2013    Procedure: ESOPHAGOGASTRODUODENOSCOPY (EGD);  Surgeon: Jerene Bears, MD;  Location: [facility] ENDOSCOPY;  Service: Endoscopy;  Laterality: N/A;  . Colonoscopy N/A 08/21/2013  Procedure: COLONOSCOPY;  Surgeon: Jerene Bears, MD;  Location: [facility] ENDOSCOPY;  Service: Endoscopy;  Laterality: N/A;  . Total hip arthroplasty Right 05/20/2015    Procedure: TOTAL HIP ARTHROPLASTY;  Surgeon: Frederik Pear, MD;  Location: [place];  Service: Orthopedics;  Laterality: Right;  . Hip closed reduction Right 08/13/2015    Procedure: CLOSED REDUCTION HIP;  Surgeon: [name], MD;  Location: [place] [facility]UR[facility] ORS;  Service: Orthopedics;  Laterality: Right;     Current Outpatient Prescriptions  Medication Sig Dispense Refill  . aspirin EC 325 MG tablet Take 1 tablet (325 mg total) by mouth 2 (two) times daily. 30 tablet 0  . carvedilol (COREG) 12.5 MG  tablet Take 1 tablet (12.5 mg total) by mouth 2 (two) times daily with a meal. [id number] tablet 3  . folic acid (FOLVITE) 1 MG tablet Take 1 tablet (1 mg total) by mouth daily. 90 tablet 1  . furosemide (LASIX) 40 MG tablet Take 1 tablet (40 mg total) by mouth daily. 30 tablet 0  . HYDROcodone-acetaminophen (NORCO/VICODIN) 5-325 MG tablet Take 1-2 tablets by mouth every 4 (four) hours as needed. 10 tablet 0  . lidocaine (LIDODERM) 5 % Place 1 patch onto the skin 2 (two) times daily as needed. Remove & Discard patch within 12 hours or as directed by MD    . losartan (COZAAR) 25 MG tablet Take 0.5 tablets (12.5 mg total) by mouth daily. 45 tablet 1  . methocarbamol (ROBAXIN) 500 MG tablet Take 1 tablet (500 mg total) by mouth 2 (two) times daily with a meal. 60 tablet 0  . Multiple Vitamin (MULTIVITAMIN WITH MINERALS) TABS tablet Take 1 tablet by mouth daily.    . potassium chloride SA (K-DUR,KLOR-CON) 20 MEQ tablet Take 2 tablets (40 mEq total) by mouth daily. 180 tablet 3  . thiamine 100 MG tablet Take 1 tablet (100 mg total) by mouth daily. 90 tablet 1  . traMADol (ULTRAM) 50 MG tablet Take 50 mg by mouth 2 (two) times daily as needed for moderate pain.      No current facility-administered medications for this visit.    Allergies:   Review of patient's allergies indicates no known allergies.    Social History:  The patient  reports that he has never smoked. He has never used smokeless tobacco. He reports that he drinks about 4.8 oz of alcohol per week. He reports that he uses illicit drugs (Marijuana).   Family History:  The patient's family history includes Hypertension in his father.    ROS:  General:no colds or fevers,  weight improved from discharge but not at previous wt yet. Skin:no rashes or ulcers HEENT:no blurred vision, no congestion CV:see HPI PUL:see HPI GI:no diarrhea constipation or melena, no indigestion GU:no hematuria, no dysuria MS:no joint pain, no claudication Neuro:no  syncope, no lightheadedness Endo:no diabetes, no thyroid disease  Wt Readings from Last 3 Encounters:  08/26/15 301 lb 12.8 oz (136.896 kg)  08/14/15 313 lb 7.9 oz (142.2 kg)  08/10/15 295 lb 13.7 oz (134.2 kg)     PHYSICAL EXAM: VS:  BP 132/72 mmHg  Pulse 83  Ht 6' (1.829 m)  Wt 301 lb 12.8 oz (136.896 kg)  BMI 40.92 kg/m2  SpO2 98% , BMI Body mass index is 40.92 kg/(m^2). General:Pleasant affect, NAD Skin:Warm and dry, brisk capillary refill HEENT:normocephalic, sclera clear, mucus membranes moist Neck:supple, no JVD, no bruits  Heart:S1S2 RRR without murmur, gallup, rub or click Lungs:clear without rales, rhonchi, or wheezes [id number], non  tender, + BS, do not palpate liver spleen or masses Ext:no lower ext edema, 2+ pedal pulses, 2+ radial pulses Neuro:alert and oriented, MAE, follows commands, + facial symmetry    EKG:  EKG is NOT ordered today.    Recent Labs: 02/25/2015: TSH 2.197 05/09/2015: ALT 16* 08/13/2015: B Natriuretic Peptide 130.2* 08/14/2015: BUN 13; Creatinine, Ser 0.93; Hemoglobin 8.1*; Magnesium 1.9; Platelets 127*; Potassium 3.7; Sodium 139    Lipid Panel No results found for: CHOL, TRIG, HDL, CHOLHDL, VLDL, LDLCALC, LDLDIRECT     Other studies Reviewed: Additional studies/ records that were reviewed today include: previous notes and hospitalization and labs..   ASSESSMENT AND PLAN:   1. Acute on chronic systolic heart failure  had improved with higher dose now with some edema on lasix 40 BID.  Will increase to 80 in am and 40 in pm for 4 days then back to BID.  Will check BMP today and resume his K+.   2. Chronic AF: Chronic atrial fibrillation rate controlled - no an anticoagulation candidate due to thrombocytopenia, alcohol abuse and medical noncompliance - continue ASA/Coreg  4. Hx of pancytopenia secondary to alcoholism  5. history of dilated cardiomyopathy secondary to alcohol abuse last EF 40-45% with PA pk pressure 35  mmHG.   Follow up with Dr. Radford [name] in 6 weeks.    Current medicines are reviewed with the patient today.  The patient Has no concerns regarding medicines.  The following changes have been made:  See above Labs/ tests ordered today include:see above  Disposition:   FU:  see above  Lennie Muckle, NP  08/26/2015 10:42 AM    [facility] [address], [place], [place], [place] Phone: [phone]; Fax: [phone]

## 2015-08-26 ENCOUNTER — Encounter: Payer: Self-pay | Admitting: Cardiology

## 2015-08-26 ENCOUNTER — Ambulatory Visit (INDEPENDENT_AMBULATORY_CARE_PROVIDER_SITE_OTHER): Payer: Commercial Managed Care - HMO | Admitting: Cardiology

## 2015-08-26 VITALS — BP 132/72 | HR 83 | Ht 72.0 in | Wt 301.8 lb

## 2015-08-26 DIAGNOSIS — F1010 Alcohol abuse, uncomplicated: Secondary | ICD-10-CM

## 2015-08-26 DIAGNOSIS — I5022 Chronic systolic (congestive) heart failure: Secondary | ICD-10-CM | POA: Diagnosis not present

## 2015-08-26 DIAGNOSIS — I482 Chronic atrial fibrillation, unspecified: Secondary | ICD-10-CM

## 2015-08-26 DIAGNOSIS — I10 Essential (primary) hypertension: Secondary | ICD-10-CM | POA: Diagnosis not present

## 2015-08-26 DIAGNOSIS — R600 Localized edema: Secondary | ICD-10-CM

## 2015-08-26 LAB — BASIC METABOLIC PANEL
BUN: 14 mg/dL (ref 7–25)
CHLORIDE: 100 mmol/L (ref 98–110)
CO2: 31 mmol/L (ref 20–31)
CREATININE: 0.85 mg/dL (ref 0.70–1.25)
Calcium: 9.5 mg/dL (ref 8.6–10.3)
Glucose, Bld: 92 mg/dL (ref 65–99)
POTASSIUM: 4.9 mmol/L (ref 3.5–5.3)
Sodium: 137 mmol/L (ref 135–146)

## 2015-08-26 MED ORDER — FUROSEMIDE 40 MG PO TABS: ORAL_TABLET | ORAL | Status: DC

## 2015-08-26 MED ORDER — POTASSIUM CHLORIDE CRYS ER 20 MEQ PO TBCR: 40.0000 meq | EXTENDED_RELEASE_TABLET | Freq: Every day | ORAL | Status: DC

## 2015-08-26 NOTE — Patient Instructions (Addendum)
Medication Instructions:  Take Furosemide 40 mg- 1 tablet 3 times daily for 4 days then decrease down to 1 tablet twice daily. All of your other medications remain the same.  Labwork: BMET today  Testing/Procedures: NONE  Follow-Up: Your physician recommends that you schedule a follow-up appointment in: 6 weeks with Dr Radford Pax   Any Other Special Instructions Will Be Listed Below (If Applicable).     If you need a refill on your cardiac medications before your next appointment, please call your pharmacy.

## 2015-09-02 ENCOUNTER — Other Ambulatory Visit: Payer: Self-pay

## 2015-09-02 MED ORDER — LIDOCAINE 5 % EX PTCH: 1.0000 | MEDICATED_PATCH | Freq: Two times a day (BID) | CUTANEOUS | Status: DC | PRN

## 2015-09-02 NOTE — Telephone Encounter (Signed)
Pt called and rq rf lidoderm patch

## 2015-09-07 ENCOUNTER — Encounter (HOSPITAL_COMMUNITY): Payer: Self-pay

## 2015-09-07 ENCOUNTER — Emergency Department (HOSPITAL_COMMUNITY): Payer: Commercial Managed Care - HMO

## 2015-09-07 ENCOUNTER — Inpatient Hospital Stay (HOSPITAL_COMMUNITY)
Admission: EM | Admit: 2015-09-07 | Discharge: 2015-09-10 | DRG: 467 | Disposition: A | Discharge status: Home Health | Payer: Commercial Managed Care - HMO | Attending: Internal Medicine | Admitting: Internal Medicine

## 2015-09-07 DIAGNOSIS — I110 Hypertensive heart disease with heart failure: Secondary | ICD-10-CM | POA: Diagnosis present

## 2015-09-07 DIAGNOSIS — F1010 Alcohol abuse, uncomplicated: Secondary | ICD-10-CM | POA: Diagnosis present

## 2015-09-07 DIAGNOSIS — Y792 Prosthetic and other implants, materials and accessory orthopedic devices associated with adverse incidents: Secondary | ICD-10-CM | POA: Diagnosis present

## 2015-09-07 DIAGNOSIS — IMO0001 Reserved for inherently not codable concepts without codable children: Secondary | ICD-10-CM

## 2015-09-07 DIAGNOSIS — I10 Essential (primary) hypertension: Secondary | ICD-10-CM | POA: Diagnosis present

## 2015-09-07 DIAGNOSIS — Z7982 Long term (current) use of aspirin: Secondary | ICD-10-CM

## 2015-09-07 DIAGNOSIS — Z96649 Presence of unspecified artificial hip joint: Secondary | ICD-10-CM

## 2015-09-07 DIAGNOSIS — T84020A Dislocation of internal right hip prosthesis, initial encounter: Secondary | ICD-10-CM | POA: Diagnosis not present

## 2015-09-07 DIAGNOSIS — I420 Dilated cardiomyopathy: Secondary | ICD-10-CM | POA: Diagnosis present

## 2015-09-07 DIAGNOSIS — T84029A Dislocation of unspecified internal joint prosthesis, initial encounter: Secondary | ICD-10-CM

## 2015-09-07 DIAGNOSIS — K766 Portal hypertension: Secondary | ICD-10-CM | POA: Diagnosis present

## 2015-09-07 DIAGNOSIS — S73004A Unspecified dislocation of right hip, initial encounter: Secondary | ICD-10-CM | POA: Diagnosis present

## 2015-09-07 DIAGNOSIS — R600 Localized edema: Secondary | ICD-10-CM | POA: Diagnosis present

## 2015-09-07 DIAGNOSIS — Z79899 Other long term (current) drug therapy: Secondary | ICD-10-CM

## 2015-09-07 DIAGNOSIS — Z6841 Body Mass Index (BMI) 40.0 and over, adult: Secondary | ICD-10-CM

## 2015-09-07 DIAGNOSIS — I482 Chronic atrial fibrillation, unspecified: Secondary | ICD-10-CM | POA: Diagnosis present

## 2015-09-07 DIAGNOSIS — K3189 Other diseases of stomach and duodenum: Secondary | ICD-10-CM | POA: Diagnosis present

## 2015-09-07 DIAGNOSIS — M24451 Recurrent dislocation, right hip: Secondary | ICD-10-CM | POA: Diagnosis present

## 2015-09-07 DIAGNOSIS — R52 Pain, unspecified: Secondary | ICD-10-CM

## 2015-09-07 DIAGNOSIS — I509 Heart failure, unspecified: Secondary | ICD-10-CM

## 2015-09-07 DIAGNOSIS — K7030 Alcoholic cirrhosis of liver without ascites: Secondary | ICD-10-CM | POA: Diagnosis present

## 2015-09-07 DIAGNOSIS — D696 Thrombocytopenia, unspecified: Secondary | ICD-10-CM | POA: Diagnosis present

## 2015-09-07 DIAGNOSIS — E669 Obesity, unspecified: Secondary | ICD-10-CM | POA: Diagnosis present

## 2015-09-07 DIAGNOSIS — I5022 Chronic systolic (congestive) heart failure: Secondary | ICD-10-CM | POA: Diagnosis present

## 2015-09-07 LAB — BASIC METABOLIC PANEL WITH GFR
Anion gap: 12 (ref 5–15)
BUN: 14 mg/dL (ref 6–20)
CO2: 26 mmol/L (ref 22–32)
Calcium: 8.9 mg/dL (ref 8.9–10.3)
Chloride: 99 mmol/L — ABNORMAL LOW (ref 101–111)
Creatinine, Ser: 1.09 mg/dL (ref 0.61–1.24)
GFR calc Af Amer: >60 mL/min
GFR calc non Af Amer: >60 mL/min
Glucose, Bld: 98 mg/dL (ref 65–99)
Potassium: 4 mmol/L (ref 3.5–5.1)
Sodium: 137 mmol/L (ref 135–145)

## 2015-09-07 LAB — CBC WITH DIFFERENTIAL/PLATELET
Basophils Absolute: 0 10*3/uL (ref 0.0–0.1)
Basophils Relative: 1 %
Eosinophils Absolute: 0.1 10*3/uL (ref 0.0–0.7)
Eosinophils Relative: 2 %
HCT: 32.2 % — ABNORMAL LOW (ref 39.0–52.0)
Hemoglobin: 10.4 g/dL — ABNORMAL LOW (ref 13.0–17.0)
Lymphocytes Relative: 24 %
Lymphs Abs: 1.3 10*3/uL (ref 0.7–4.0)
MCH: 31.8 pg (ref 26.0–34.0)
MCHC: 32.3 g/dL (ref 30.0–36.0)
MCV: 98.5 fL (ref 78.0–100.0)
Monocytes Absolute: 0.8 10*3/uL (ref 0.1–1.0)
Monocytes Relative: 14 %
Neutro Abs: 3.3 10*3/uL (ref 1.7–7.7)
Neutrophils Relative %: 59 %
Platelets: 115 10*3/uL — ABNORMAL LOW (ref 150–400)
RBC: 3.27 MIL/uL — ABNORMAL LOW (ref 4.22–5.81)
RDW: 16 % — ABNORMAL HIGH (ref 11.5–15.5)
WBC: 5.5 10*3/uL (ref 4.0–10.5)

## 2015-09-07 LAB — PROTIME-INR
INR: 1.22 (ref 0.00–1.49)
Prothrombin Time: 15.6 s — ABNORMAL HIGH (ref 11.6–15.2)

## 2015-09-07 MED ORDER — PROPOFOL 10 MG/ML IV BOLUS
INTRAVENOUS | Status: AC | PRN
  Administered 2015-09-07 (×6): 50 mg via INTRAVENOUS

## 2015-09-07 MED ORDER — MORPHINE SULFATE (PF) 4 MG/ML IV SOLN
4.0000 mg | Freq: Once | INTRAVENOUS | Status: AC
  Administered 2015-09-07: 4 mg via INTRAVENOUS
  Filled 2015-09-07: qty 1

## 2015-09-07 MED ORDER — PROPOFOL 10 MG/ML IV BOLUS
100.0000 mg | Freq: Once | INTRAVENOUS | Status: DC
  Filled 2015-09-07: qty 20

## 2015-09-07 MED ORDER — SODIUM CHLORIDE 0.9 % IV BOLUS (SEPSIS)
500.0000 mL | Freq: Once | INTRAVENOUS | Status: AC
  Administered 2015-09-07: 500 mL via INTRAVENOUS

## 2015-09-07 MED ORDER — PROPOFOL 10 MG/ML IV BOLUS
INTRAVENOUS | Status: AC
  Filled 2015-09-07: qty 20

## 2015-09-07 MED ORDER — HYDROCODONE-ACETAMINOPHEN 5-325 MG PO TABS: 1.0000 | ORAL_TABLET | ORAL | Status: DC | PRN

## 2015-09-07 NOTE — Sedation Documentation (Signed)
Attempted reduction, awaiting portable xray at this time

## 2015-09-07 NOTE — Sedation Documentation (Signed)
Additional xray completed, reduction complete, brace being applied at this time

## 2015-09-07 NOTE — Sedation Documentation (Signed)
X-ray at bedside

## 2015-09-07 NOTE — ED Notes (Signed)
Per EMS - pt had right hip replacement in January 2017. Hx dislocation of right hip since surgery. Pt bent over to tie shoes today and felt hip pop out. Shortening and external rotation noted to right leg. Right foot warm, pulses intact. Pt told by ortho surgeon that if hip dislocated again he would need surgery. VSS.

## 2015-09-07 NOTE — ED Provider Notes (Signed)
CSN: [id number]     Arrival date & time 09/07/15  1841 History   First MD Initiated Contact with Patient 09/07/15 1845     Chief Complaint  Patient presents with  . Hip Pain     (Consider location/radiation/quality/duration/timing/severity/associated sxs/prior Treatment) The history is provided by the patient.  Thomas [name] is a 65 y.o. male hx of HTN, systolic CHF, alcohol abuse here presenting with possible right hip dislocation. Patient was bending down and then trying to tie his shoes and then dislocated the right hip. He had similar dislocation about month ago that was reduced in the ER. Patient was unable to see his orthopedic in the meantime and had noticed further surgery since then. Denies any other surgeries in the past.     Past Medical History  Diagnosis Date  . Obesity   . Arthritis   . Hypertension   . Chronic systolic CHF (congestive heart failure), NYHA class 2 ([place])   . DCM (dilated cardiomyopathy) ([place])     EF 30% but now improved to 40-45% by echo 2016 on medical therapy with moderate MR  . Pancytopenia ([employer])   . ETOH abuse   . Alcoholic liver disease ([place])   . Gastroduodenitis     H Pylori positive  . Chronic atrial fibrillation ([name])     no an anticoagulation canditate due to alcohol abuse, liver cirrhosis with pancytopenia, medical noncompliance  . Diverticulosis of colon   . Portal hypertensive gastropathy   . Personal history of colonic polyps - adenomas 08/21/2013  . Helicobacter pylori gastritis 10/19/2013  . Edema extremities 07/02/2015   Past Surgical History  Procedure Laterality Date  . Esophagogastroduodenoscopy N/A 08/21/2013    Procedure: ESOPHAGOGASTRODUODENOSCOPY (EGD);  Surgeon: Jerene Bears, MD;  Location: [facility] ENDOSCOPY;  Service: Endoscopy;  Laterality: N/A;  . Colonoscopy N/A 08/21/2013    Procedure: COLONOSCOPY;  Surgeon: Jerene Bears, MD;  Location[facility] ENDOSCOPY;  Service: Endoscopy;  Laterality: N/A;  . Total hip arthroplasty Right 05/20/2015     Procedure: TOTAL HIP ARTHROPLASTY;  Surgeon: Frederik Pear, MD;  Location: [place];  Service: Orthopedics;  Laterality: Right;  . Hip closed reduction Right 08/13/2015    Procedure: CLOSED REDUCTION HIP;  Surgeon: [name], MD;  Location: [place] NEURO ORS;  Service: Orthopedics;  Laterality: Right;   Family History  Problem Relation Age of Onset  . Hypertension Father    Social History  Substance Use Topics  . Smoking status: Never Smoker   . Smokeless tobacco: Never Used  . Alcohol Use: 4.8 oz/week    8 Cans of beer per week     Comment: former ETOH abuse     Review of Systems  Musculoskeletal:       R hip pain   All other systems reviewed and are negative.     Allergies  Review of patient's allergies indicates no known allergies.  Home Medications   Prior to Admission medications   Medication Sig Start Date End Date Taking? Authorizing Provider  aspirin EC 325 MG tablet Take 1 tablet (325 mg total) by mouth 2 (two) times daily. 05/20/15  Yes Leighton Parody, PA-C  carvedilol (COREG) 12.5 MG tablet Take 1 tablet (12.5 mg total) by mouth 2 (two) times daily with a meal. 07/02/15  Yes Sueanne Margarita, MD  folic acid (FOLVITE) 1 MG tablet Take 1 tablet (1 mg total) by mouth daily. 07/31/15  Yes Hoyt Koch, MD  furosemide (LASIX) 40 MG tablet Take 1  tablet 3 times daily for 4 days then decrease back down to 1 tablet twice daily Patient taking differently: Take 40 mg by mouth 2 (two) times daily. Take 1 tablet 3 times daily for 4 days then decrease back down to 1 tablet twice daily 08/26/15  Yes Isaiah Serge, NP  lidocaine (LIDODERM) 5 % Place 1 patch onto the skin 2 (two) times daily as needed. Remove & Discard patch within 12 hours or as directed by MD Patient taking differently: Place 1 patch onto the skin 2 (two) times daily as needed (for nerve pain). Remove & Discard patch within 12 hours or as directed by MD 09/02/15  Yes Hoyt Koch, MD  losartan (COZAAR) 25  MG tablet Take 0.5 tablets (12.5 mg total) by mouth daily. 07/31/15  Yes Hoyt Koch, MD  Multiple Vitamin (MULTIVITAMIN WITH MINERALS) TABS tablet Take 1 tablet by mouth daily. 08/24/13  Yes Geradine Girt, DO  potassium chloride SA (K-DUR,KLOR-CON) 20 MEQ tablet Take 2 tablets (40 mEq total) by mouth daily. 08/26/15  Yes Isaiah Serge, NP  thiamine 100 MG tablet Take 1 tablet (100 mg total) by mouth daily. 07/31/15  Yes Hoyt Koch, MD  HYDROcodone-acetaminophen (NORCO/VICODIN) 5-325 MG tablet Take 1-2 tablets by mouth every 4 (four) hours as needed. 08/12/15   Comer Locket, PA-C  methocarbamol (ROBAXIN) 500 MG tablet Take 1 tablet (500 mg total) by mouth 2 (two) times daily with a meal. 05/20/15   Leighton Parody, PA-C   BP 137/80 mmHg  Pulse 85  Temp(Src) 98 F (36.7 C) (Oral)  Resp 15  SpO2 99% Physical Exam  Constitutional: He is oriented to person, place, and time. He appears well-developed and well-nourished.  HENT:  Head: Normocephalic.  Mouth/Throat: Oropharynx is clear and moist.  Eyes: Conjunctivae are normal. Pupils are equal, round, and reactive to light.  Neck: Normal range of motion. Neck supple.  Cardiovascular: Normal rate, regular rhythm and normal heart sounds.   Pulmonary/Chest: Effort normal and breath sounds normal. No respiratory distress. He has no wheezes. He has no rales.  Abdominal: Soft. Bowel sounds are normal. He exhibits no distension. There is no tenderness. There is no rebound.  Genitourinary:  R hip externally rotated, shortened. 2+ pulses DP pulse, able to wiggle toes   Neurological: He is alert and oriented to person, place, and time.  Skin: Skin is warm and dry.  Psychiatric: He has a normal mood and affect. His behavior is normal. Judgment and thought content normal.  Nursing note and vitals reviewed.   ED Course  Reduction of dislocation Date/Time: 09/07/2015 11:12 PM Performed by: Wandra Arthurs Authorized by: Wandra Arthurs Consent: Verbal consent obtained. Written consent obtained. Risks and benefits: risks, benefits and alternatives were discussed Consent given by: patient Patient understanding: patient states understanding of the procedure being performed Patient consent: the patient's understanding of the procedure matches consent given Procedure consent: procedure consent matches procedure scheduled Relevant documents: relevant documents present and verified Required items: required blood products, implants, devices, and special equipment available Patient identity confirmed: verbally with patient Preparation: Patient was prepped and draped in the usual sterile fashion. Local anesthesia used: no Patient sedated: yes Sedatives: propofol Vitals: Vital signs were monitored during sedation. Patient tolerance: Patient tolerated the procedure well with no immediate complications   (including critical care time)    Labs Review Labs Reviewed  CBC WITH DIFFERENTIAL/PLATELET - Abnormal; Notable for the following:    RBC 3.27 (*)  Hemoglobin 10.4 (*)    HCT 32.2 (*)    RDW 16.0 (*)    Platelets 115 (*)    All other components within normal limits  BASIC METABOLIC PANEL - Abnormal; Notable for the following:    Chloride 99 (*)    All other components within normal limits  PROTIME-INR - Abnormal; Notable for the following:    Prothrombin Time 15.6 (*)    All other components within normal limits  TYPE AND SCREEN    Imaging Review Dg Hip Unilat With Pelvis 1v Right  09/07/2015  CLINICAL DATA:  Status post hip reduction EXAM: DG HIP (WITH OR WITHOUT PELVIS) 1V RIGHT COMPARISON:  Sep 07, 2015 FINDINGS: Two images were obtained. On the first, the hip was still dislocated. On the second, the head of the femoral hardware appeared to be located within the acetabular component. No other acute abnormalities. IMPRESSION: Right hip re- location. Electronically Signed   By: Dorise Bullion III M.D   On:  09/07/2015 21:22   Dg Hip Unilat With Pelvis 2-3 Views Right  09/07/2015  CLINICAL DATA:  Heard pop at right hip while bending over. Concern for right hip prosthesis dislocation. Initial encounter. EXAM: DG HIP (WITH OR WITHOUT PELVIS) 2-3V RIGHT COMPARISON:  Right hip radiographs performed 08/12/2015 FINDINGS: There is recurrent superior dislocation of the patient's right hip prosthesis. No fracture is seen. Surrounding heterotopic bone formation is again noted. Mild axial joint space narrowing is noted at the left hip. Degenerative change is seen along the lumbar spine. Scattered phleboliths are seen. IMPRESSION: Recurrent superior dislocation of the right hip prosthesis. No evidence of fracture. Mild axial joint space narrowing at the left hip. Electronically Signed   By: Garald Balding M.D.   On: 09/07/2015 19:56   I have personally reviewed and evaluated these images and lab results as part of my medical decision-making.   EKG Interpretation None      MDM   Final diagnoses:  None    Thomas [name] is a 65 y.o. male here with R hip pain, likely dislocation. Will get preop labs, xrays.   8 pm Xray showed dislocation. Called dr. Rhona Raider who wants [place] to do sedation and reduction at bedside.   11:13 PM Reduction performed, confirmed by xrays. Now awake and alert. Has a brace that was placed and also placed on knee immobolizer. Will have him follow up with ortho.        Wandra Arthurs, MD 09/07/15 [phone]

## 2015-09-07 NOTE — Sedation Documentation (Signed)
Additional reduction needed, further sedation given

## 2015-09-07 NOTE — Progress Notes (Signed)
Orthopedic Tech Progress Note Patient Details:  Thomas [name] [birth year] [id number] Assisted with reduction of Rt. hip dislocation. Applied Velcro knee immobilizer post-reduction.  Pulses, sensation, motion intact before and after application.  Capillary refill less than 2 seconds before and after application. Ortho Devices Type of Ortho Device: Knee Immobilizer Ortho Device/Splint Location: RLE Ortho Device/Splint Interventions: Application   Darrol Poke 09/07/2015, 10:06 PM

## 2015-09-07 NOTE — ED Notes (Signed)
Patient transported to X-ray via stretcher 

## 2015-09-07 NOTE — Sedation Documentation (Signed)
Awaiting ortho tech arrival for start of sedation

## 2015-09-08 ENCOUNTER — Emergency Department (HOSPITAL_COMMUNITY): Payer: Commercial Managed Care - HMO

## 2015-09-08 ENCOUNTER — Observation Stay (HOSPITAL_COMMUNITY): Payer: Commercial Managed Care - HMO

## 2015-09-08 DIAGNOSIS — R609 Edema, unspecified: Secondary | ICD-10-CM

## 2015-09-08 DIAGNOSIS — T148 Other injury of unspecified body region: Secondary | ICD-10-CM

## 2015-09-08 DIAGNOSIS — IMO0001 Reserved for inherently not codable concepts without codable children: Secondary | ICD-10-CM | POA: Insufficient documentation

## 2015-09-08 DIAGNOSIS — I5022 Chronic systolic (congestive) heart failure: Secondary | ICD-10-CM | POA: Diagnosis not present

## 2015-09-08 LAB — HEPATIC FUNCTION PANEL
ALT: 11 U/L — ABNORMAL LOW (ref 17–63)
AST: 24 U/L (ref 15–41)
Albumin: 3.3 g/dL — ABNORMAL LOW (ref 3.5–5.0)
Alkaline Phosphatase: 67 U/L (ref 38–126)
BILIRUBIN DIRECT: 0.3 mg/dL (ref 0.1–0.5)
BILIRUBIN INDIRECT: 1.3 mg/dL — AB (ref 0.3–0.9)
Total Bilirubin: 1.6 mg/dL — ABNORMAL HIGH (ref 0.3–1.2)
Total Protein: 6.6 g/dL (ref 6.5–8.1)

## 2015-09-08 LAB — URINALYSIS, ROUTINE W REFLEX MICROSCOPIC
Bilirubin Urine: NEGATIVE
Glucose, UA: NEGATIVE mg/dL
Hgb urine dipstick: NEGATIVE
KETONES UR: NEGATIVE mg/dL
LEUKOCYTES UA: NEGATIVE
NITRITE: NEGATIVE
PROTEIN: NEGATIVE mg/dL
Specific Gravity, Urine: 1.02 (ref 1.005–1.030)
pH: 8.5 — ABNORMAL HIGH (ref 5.0–8.0)

## 2015-09-08 LAB — BRAIN NATRIURETIC PEPTIDE: B NATRIURETIC PEPTIDE 5: 89.9 pg/mL (ref 0.0–100.0)

## 2015-09-08 LAB — SURGICAL PCR SCREEN
MRSA, PCR: NEGATIVE
STAPHYLOCOCCUS AUREUS: NEGATIVE

## 2015-09-08 MED ORDER — POVIDONE-IODINE 10 % EX SWAB
2.0000 "application " | Freq: Once | CUTANEOUS | Status: AC
  Administered 2015-09-09: 2 via TOPICAL

## 2015-09-08 MED ORDER — CHLORHEXIDINE GLUCONATE 4 % EX LIQD
60.0000 mL | Freq: Once | CUTANEOUS | Status: AC
  Administered 2015-09-09: 4 via TOPICAL
  Filled 2015-09-08 (×2): qty 60

## 2015-09-08 MED ORDER — ACETAMINOPHEN 650 MG RE SUPP: 650.0000 mg | Freq: Four times a day (QID) | RECTAL | Status: DC | PRN

## 2015-09-08 MED ORDER — TRANEXAMIC ACID 1000 MG/10ML IV SOLN
1000.0000 mg | INTRAVENOUS | Status: AC
  Administered 2015-09-09: 1000 mg via INTRAVENOUS
  Filled 2015-09-08 (×2): qty 10

## 2015-09-08 MED ORDER — POVIDONE-IODINE 10 % EX SWAB: 2.0000 "application " | Freq: Once | CUTANEOUS | Status: DC

## 2015-09-08 MED ORDER — ONDANSETRON HCL 4 MG PO TABS: 4.0000 mg | ORAL_TABLET | Freq: Four times a day (QID) | ORAL | Status: DC | PRN

## 2015-09-08 MED ORDER — CARVEDILOL 12.5 MG PO TABS
12.5000 mg | ORAL_TABLET | Freq: Two times a day (BID) | ORAL | Status: DC
  Administered 2015-09-08 – 2015-09-10 (×4): 12.5 mg via ORAL
  Filled 2015-09-08 (×4): qty 1

## 2015-09-08 MED ORDER — FUROSEMIDE 10 MG/ML IJ SOLN: 20.0000 mg | Freq: Once | INTRAMUSCULAR | Status: DC

## 2015-09-08 MED ORDER — ASPIRIN 325 MG PO TABS: 325.0000 mg | ORAL_TABLET | Freq: Every day | ORAL | Status: DC

## 2015-09-08 MED ORDER — HEPARIN SODIUM (PORCINE) 5000 UNIT/ML IJ SOLN: 5000.0000 [IU] | Freq: Three times a day (TID) | INTRAMUSCULAR | Status: DC

## 2015-09-08 MED ORDER — PROPOFOL 10 MG/ML IV BOLUS
1.0000 mg/kg | Freq: Once | INTRAVENOUS | Status: AC
  Administered 2015-09-08: 100 mg via INTRAVENOUS
  Filled 2015-09-08: qty 20

## 2015-09-08 MED ORDER — SODIUM CHLORIDE 0.9% FLUSH
3.0000 mL | Freq: Two times a day (BID) | INTRAVENOUS | Status: DC
  Administered 2015-09-08 – 2015-09-10 (×4): 3 mL via INTRAVENOUS

## 2015-09-08 MED ORDER — LORAZEPAM 2 MG/ML IJ SOLN: 1.0000 mg | Freq: Four times a day (QID) | INTRAMUSCULAR | Status: DC | PRN

## 2015-09-08 MED ORDER — ONDANSETRON HCL 4 MG/2ML IJ SOLN: 4.0000 mg | Freq: Four times a day (QID) | INTRAMUSCULAR | Status: DC | PRN

## 2015-09-08 MED ORDER — POLYETHYLENE GLYCOL 3350 17 G PO PACK: 17.0000 g | PACK | Freq: Every day | ORAL | Status: DC | PRN

## 2015-09-08 MED ORDER — LOSARTAN POTASSIUM 25 MG PO TABS
12.5000 mg | ORAL_TABLET | Freq: Every day | ORAL | Status: DC
Start: 2015-09-09 — End: 2015-09-10
  Administered 2015-09-09 – 2015-09-10 (×2): 12.5 mg via ORAL
  Filled 2015-09-08 (×2): qty 1

## 2015-09-08 MED ORDER — DEXTROSE 5 % IV SOLN
3.0000 g | INTRAVENOUS | Status: AC
  Administered 2015-09-09: 3 g via INTRAVENOUS
  Filled 2015-09-08: qty 3000

## 2015-09-08 MED ORDER — LORAZEPAM 1 MG PO TABS: 1.0000 mg | ORAL_TABLET | Freq: Four times a day (QID) | ORAL | Status: DC | PRN

## 2015-09-08 MED ORDER — BISACODYL 5 MG PO TBEC: 5.0000 mg | DELAYED_RELEASE_TABLET | Freq: Every day | ORAL | Status: DC | PRN

## 2015-09-08 MED ORDER — DEXTROSE-NACL 5-0.45 % IV SOLN
INTRAVENOUS | Status: DC
Start: 2015-09-08 — End: 2015-09-09

## 2015-09-08 MED ORDER — VITAMIN B-1 100 MG PO TABS
100.0000 mg | ORAL_TABLET | Freq: Every day | ORAL | Status: DC
  Administered 2015-09-09 – 2015-09-10 (×2): 100 mg via ORAL
  Filled 2015-09-08 (×2): qty 1

## 2015-09-08 MED ORDER — ACETAMINOPHEN 325 MG PO TABS: 650.0000 mg | ORAL_TABLET | Freq: Four times a day (QID) | ORAL | Status: DC | PRN

## 2015-09-08 MED ORDER — ASPIRIN EC 325 MG PO TBEC: 325.0000 mg | DELAYED_RELEASE_TABLET | Freq: Two times a day (BID) | ORAL | Status: DC

## 2015-09-08 MED ORDER — CHLORHEXIDINE GLUCONATE 4 % EX LIQD
60.0000 mL | Freq: Once | CUTANEOUS | Status: DC
  Filled 2015-09-08: qty 60

## 2015-09-08 MED ORDER — MORPHINE SULFATE (PF) 4 MG/ML IV SOLN
4.0000 mg | INTRAVENOUS | Status: DC | PRN
  Administered 2015-09-08: 4 mg via INTRAVENOUS
  Filled 2015-09-08: qty 1

## 2015-09-08 MED ORDER — FOLIC ACID 1 MG PO TABS
1.0000 mg | ORAL_TABLET | Freq: Every day | ORAL | Status: DC
  Administered 2015-09-09 – 2015-09-10 (×2): 1 mg via ORAL
  Filled 2015-09-08 (×2): qty 1

## 2015-09-08 MED ORDER — KETAMINE HCL-SODIUM CHLORIDE 100-0.9 MG/10ML-% IV SOSY
0.5000 mg/kg | PREFILLED_SYRINGE | Freq: Once | INTRAVENOUS | Status: AC
  Administered 2015-09-08: 65 mg via INTRAVENOUS
  Filled 2015-09-08: qty 10

## 2015-09-08 NOTE — ED Provider Notes (Addendum)
Patient seen tonight by Dr. Darl Householder for prosthetic hip dislocation. Hip was reduced by Dr. Darl Householder. Patient was put up for discharge. At time of discharge, however, patient stood up and felt a pop in his appointment again. I reevaluated the patient and he had pain in the right hip, decreased range of motion with shortening of the leg consistent with recurrent dislocation. X-ray confirmed.  Physical Exam  BP 124/78 mmHg  Pulse 93  Temp(Src) 98.1 F (36.7 C) (Oral)  Resp 17  SpO2 100%  Physical Exam  ED Course  .Sedation Date/Time: 09/08/2015 6:19 AM Performed by: Orpah Greek Authorized by: Orpah Greek  Consent:    Consent obtained:  Verbal and written   Consent given by:  Patient   Risks discussed:  Prolonged hypoxia resulting in organ damage, prolonged sedation necessitating reversal, respiratory compromise necessitating ventilatory assistance and intubation and vomiting Universal protocol:    Procedure explained and questions answered to patient or proxy's satisfaction: yes     Relevant documents present and verified: yes     Test results available and properly labeled: yes     Imaging studies available: yes     Required blood products, implants, devices, and special equipment available: yes     Immediately prior to procedure a time out was called: yes     Patient identity confirmation method:  Arm band and verbally with patient Indications:    Sedation purpose:  Dislocation reduction   Procedure necessitating sedation performed by:  Physician performing sedation   Intended level of sedation:  Moderate (conscious sedation) Pre-sedation assessment:    Time since last food or drink:  >6hours   ASA classification: class 3 - patient with severe systemic disease     Neck mobility: normal     Mouth opening:  3 or more finger widths   Pre-sedation assessments completed and reviewed: airway patency, cardiovascular function, mental status, pain level and respiratory  function   Immediate pre-procedure details:    Reassessment: Patient reassessed immediately prior to procedure     Reviewed: vital signs     Verified: bag valve mask available, emergency equipment available and IV patency confirmed   Procedure details (see MAR for exact dosages):    Sedation start time:  09/08/2015 4:45 AM   Preoxygenation:  Nasal cannula   Sedation:  Ketamine and propofol   Intra-procedure monitoring:  Blood pressure monitoring, cardiac monitor, continuous pulse oximetry, frequent LOC assessments and frequent vital sign checks   Intra-procedure events: none     Sedation end time:  09/08/2015 4:57 AM Post-procedure details:    Post-sedation assessment completed:  09/08/2015 6:23 AM   Attendance: Constant attendance by certified staff until patient recovered     Recovery: Patient returned to pre-procedure baseline     Post-sedation assessments completed and reviewed: airway patency, cardiovascular function, mental status and pain level   ORTHOPEDIC INJURY TREATMENT Date/Time: 09/08/2015 4:45 AM Performed by: Orpah Greek Authorized by: Orpah Greek Consent: Written consent obtained. Risks and benefits: risks, benefits and alternatives were discussed Consent given by: patient Patient understanding: patient states understanding of the procedure being performed Patient consent: the patient's understanding of the procedure matches consent given Procedure consent: procedure consent matches procedure scheduled Relevant documents: relevant documents present and verified Test results: test results available and properly labeled Site marked: the operative site was marked Imaging studies: imaging studies available Patient identity confirmed: verbally with patient and arm band Time out: Immediately prior to procedure a "time out"  was called to verify the correct patient, procedure, equipment, support staff and site/side marked as required. Injury location:  hip Location details: right hip Injury type: dislocation Dislocation type: anterior Spontaneous dislocation: yes Prosthesis: yes Pre-procedure neurovascular assessment: neurovascularly intact Patient sedated: yes Sedatives: see MAR for details Manipulation performed: yes Reduction method: abduction, external rotation, extension and traction and counter traction Reduction successful: yes X-ray confirmed reduction: yes Immobilization: knee immobilizer. Post-procedure neurovascular assessment: post-procedure neurovascularly intact Post-procedure distal perfusion: normal Post-procedure neurological function: normal Patient tolerance: Patient tolerated the procedure well with no immediate complications    MDM Patient with recurrent dislocation of right hip. Discussed with Dr. Rhona Raider. He will admit the patient but recommended attempt at closed reduction in the ER prior to admission. This attempt was performed and was successful.  Reviewing the patient's records reveals that he has a history of alcohol abuse, atrial fibrillation, congestive heart failure. Based on this, patient not a good candidate for admission to the orthopedic service. Hospitalist consultation for admission. Dr. Rhona Raider will see the patient later.      Orpah Greek, MD 09/08/15 ED:[id number]  Orpah Greek, MD 09/08/15 [id number]

## 2015-09-08 NOTE — Consult Note (Addendum)
[name], MD  Chauncey Cruel, PA-C  Loni Dolly, PA-C                                  [facility]                [address] [place], [place]  [address]   ORTHOPAEDIC CONSULTATION  Thomas [name]            MRN:  [id number] DOB/SEX:  1951-01-14/male     CHIEF COMPLAINT:  Unstable right hip  HISTORY: Thomas Phillipsis a 65 y.o. male with multiple dislocations of replaced right hip.  Most recently dislocated at home yesterday tying his shoe while wearing his brace.  Was relocated in ER but then dislocated again when getting up to leave.  Was again reduced and admitted to medicine with revision surgery planned for Monday with Dr Mayer Camel.  Fairly comfortable now.   PAST MEDICAL HISTORY: Patient Active Problem List   Diagnosis Date Noted  . Dislocation   . Hip dislocation, right ([place])   . Recurrent dislocation of hip 08/13/2015  . Acute kidney injury ([place]) 08/13/2015  . Recurrent dislocation of hip joint prosthesis ([place]) 08/12/2015  . Edema extremities 07/02/2015  . Primary osteoarthritis of right hip 05/18/2015  . Non compliance w medication regimen 02/25/2015  . Right hip pain 02/25/2015  . Lead toxicity 10/19/2013  . Hypertension   . Personal history of colonic polyps - adenomas 08/21/2013  . Portal hypertensive gastropathy 08/21/2013  . DCM (dilated cardiomyopathy) ([place]) 08/19/2013  . Chronic systolic CHF (congestive heart failure), NYHA class 2 ([place]) 08/19/2013  . Alcoholic liver disease ([place]) 08/18/2013  . Chronic atrial fibrillation ([place]) 08/17/2013  . Pancytopenia ([place]) 08/17/2013  . ETOH abuse 08/17/2013   Past Medical History  Diagnosis Date  . Obesity   . Arthritis   . Hypertension   . Chronic systolic CHF (congestive heart failure), NYHA class 2 ([place])   . DCM (dilated cardiomyopathy) ([place])     EF 30% but now improved to 40-45% by echo 2016 on medical therapy with moderate MR  . Pancytopenia ([place])   . ETOH abuse   . Alcoholic liver disease  ([place])   . Gastroduodenitis     H Pylori positive  . Chronic atrial fibrillation ([name])     no an anticoagulation canditate due to alcohol abuse, liver cirrhosis with pancytopenia, medical noncompliance  . Diverticulosis of colon   . Portal hypertensive gastropathy   . Personal history of colonic polyps - adenomas 08/21/2013  . Helicobacter pylori gastritis 10/19/2013  . Edema extremities 07/02/2015   Past Surgical History  Procedure Laterality Date  . Esophagogastroduodenoscopy N/A 08/21/2013    Procedure: ESOPHAGOGASTRODUODENOSCOPY (EGD);  Surgeon: Jerene Bears, MD;  Location: [facility] ENDOSCOPY;  Service: Endoscopy;  Laterality: N/A;  . Colonoscopy N/A 08/21/2013    Procedure: COLONOSCOPY;  Surgeon: Jerene Bears, MD;  Location: [facility] ENDOSCOPY;  Service: Endoscopy;  Laterality: N/A;  . Total hip arthroplasty Right 05/20/2015    Procedure: TOTAL HIP ARTHROPLASTY;  Surgeon: Frederik Pear, MD;  Location: [facility];  Service: Orthopedics;  Laterality: Right;  . Hip closed reduction Right 08/13/2015    Procedure: CLOSED REDUCTION HIP;  Surgeon: [name], MD;  Location: [facility];  Service: Orthopedics;  Laterality: Right;     MEDICATIONS:   Current facility-administered medications:  .  acetaminophen (TYLENOL) tablet 650 mg, 650 mg, Oral, Q6H PRN **OR**  acetaminophen (TYLENOL) suppository 650 mg, 650 mg, Rectal, Q6H PRN, Willia Craze, NP .  bisacodyl (DULCOLAX) EC tablet 5 mg, 5 mg, Oral, Daily PRN, Willia Craze, NP .  carvedilol (COREG) tablet 12.5 mg, 12.5 mg, Oral, BID WC, Willia Craze, NP .  Derrill Memo ON 0000000 folic acid (FOLVITE) tablet 1 mg, 1 mg, Oral, Daily, Willia Craze, NP .  heparin injection 5,000 Units, 5,000 Units, Subcutaneous, Q8H, Melburn Popper, RPH .  LORazepam (ATIVAN) tablet 1 mg, 1 mg, Oral, Q6H PRN **OR** LORazepam (ATIVAN) injection 1 mg, 1 mg, Intravenous, Q6H PRN, Willia Craze, NP .  Derrill Memo ON 09/09/2015] losartan (COZAAR) tablet 12.5 mg, 12.5 mg, Oral, Daily,  Willia Craze, NP .  morphine 4 MG/ML injection 4 mg, 4 mg, Intravenous, Q4H PRN, Willia Craze, NP .  ondansetron (ZOFRAN) tablet 4 mg, 4 mg, Oral, Q6H PRN **OR** ondansetron (ZOFRAN) injection 4 mg, 4 mg, Intravenous, Q6H PRN, Willia Craze, NP .  polyethylene glycol (MIRALAX / GLYCOLAX) packet 17 [name], 17 [name], Oral, Daily PRN, Willia Craze, NP .  propofol (DIPRIVAN) 10 mg/mL bolus/IV push 100 mg, 100 mg, Intravenous, Once, Wandra Arthurs, MD, 200 mg at 09/07/15 2052 .  sodium chloride flush (NS) 0.9 % injection 3 mL, 3 mL, Intravenous, Q12H, Willia Craze, NP .  Derrill Memo ON 09/09/2015] thiamine (VITAMIN B-1) tablet 100 mg, 100 mg, Oral, Daily, Willia Craze, NP  ALLERGIES:  No Known Allergies  REVIEW OF SYSTEMS: REVIEWED IN DETAIL IN CHART  FAMILY HISTORY:   Family History  Problem Relation Age of Onset  . Hypertension Father     SOCIAL HISTORY:   Social History  Substance Use Topics  . Smoking status: Never Smoker   . Smokeless tobacco: Never Used  . Alcohol Use: 4.8 oz/week    8 Cans of beer per week     Comment: former ETOH abuse      EXAMINATION: Vital signs in last 24 hours: Temp:  [97.9 F (36.6 C)-98.3 F (36.8 C)] 98.3 F (36.8 C) (05/21 1017) Pulse Rate:  [81-99] 98 (05/21 1017) Resp:  [12-26] 16 (05/21 1017) BP: (83-145)/(41-93) 145/86 mmHg (05/21 1017) SpO2:  [96 %-100 %] 100 % (05/21 1017)  BP 145/86 mmHg  Pulse 98  Temp(Src) 98.3 F (36.8 C) (Oral)  Resp 16  SpO2 100%  General Appearance:    Alert, cooperative, no distress, appears stated age  Head:    Normocephalic, without obvious abnormality, atraumatic  Eyes:    PERRL, conjunctiva/corneas clear, EOM's intact, fundi    benign, both eyes       Ears:    Normal TM's and external ear canals, both ears  Nose:   Nares normal, septum midline, mucosa normal, no drainage    or sinus tenderness  Throat:   Lips, mucosa, and tongue normal; teeth and gums normal  Neck:   Supple, symmetrical, trachea  midline, no adenopathy;       thyroid:  No enlargement/tenderness/nodules; no carotid   bruit or JVD  Back:     Symmetric, no curvature, ROM normal, no CVA tenderness  Lungs:     Clear to auscultation bilaterally, respirations unlabored  Chest wall:    No tenderness or deformity  Heart:    irregular rate and rhythm, S1 and S2 normal, no murmur, rub   or gallop  Abdomen:     Soft, non-tender, bowel sounds active all four quadrants,    no masses, no  organomegaly, moderately obese  Genitalia:    Rectal:    Extremities:   Extremities normal, atraumatic, no cyanosis or edema  Pulses:   2+ and symmetric all extremities  Skin:   Skin color, texture, turgor normal, no rashes or lesions  Lymph nodes:   Cervical, supraclavicular, and axillary nodes normal  Neurologic:   CNII-XII intact. Normal strength, sensation and reflexes      throughout    Musculoskeletal Exam:   Leg lengths equal and good painless ROM   DIAGNOSTIC STUDIES: Recent laboratory studies:  Recent Labs  09/07/15 1855  WBC 5.5  HGB 10.4*  HCT 32.2*  PLT 115*    Recent Labs  09/07/15 1855  NA 137  K 4.0  CL 99*  CO2 26  BUN 14  CREATININE 1.09  GLUCOSE 98  CALCIUM 8.9   Lab Results  Component Value Date   INR 1.22 09/07/2015   INR 1.33 08/13/2015   INR 1.19 05/09/2015     Recent Radiographic Studies :  Dg Chest Port 1 View  09-30-2015  CLINICAL DATA:  Heart failure. History of hypertension, CHF and atrial fibrillation. EXAM: PORTABLE CHEST 1 VIEW COMPARISON:  05/09/2015; 02/25/2015 FINDINGS: Grossly unchanged enlarged cardiac silhouette and mediastinal contours with slight differences likely attributable to AP projection. There is persistent mild elevation/eventration involving the lateral aspect of the left hemidiaphragm. No focal airspace opacities. No pleural effusion or pneumothorax. No evidence of edema. No acute osseous abnormalities. IMPRESSION: Cardiomegaly without acute cardiopulmonary disease.  Electronically Signed   By: Sandi Mariscal M.D.   On: 09-30-15 09:56   Dg C-arm 1-60 Min-no Report  08/13/2015  CLINICAL DATA: closed reduction right hip C-ARM 1-60 MINUTES Fluoroscopy was utilized by the requesting physician.  No radiographic interpretation.   Dg Hip Unilat With Pelvis 1v Right  30-Sep-2015  CLINICAL DATA:  Acute onset of right hip pain.  Initial encounter. EXAM: DG HIP (WITH OR WITHOUT PELVIS) 1V RIGHT COMPARISON:  Right hip radiographs performed 09/07/2015 FINDINGS: There is superior dislocation of the right hip prosthesis. No fracture is seen. There is chronic superior displacement of the right hip prosthesis, likely degenerative in nature. There is mild axial joint space narrowing at the left hip. Degenerative change is noted about the pubic symphysis. Degenerative change is seen along the lower lumbar spine. Scattered phleboliths are seen. IMPRESSION: Superior dislocation of the right hip prosthesis. Electronically Signed   By: Garald Balding M.D.   On: 09/30/2015 02:34   Dg Hip Unilat With Pelvis 1v Right  09/07/2015  CLINICAL DATA:  Status post hip reduction EXAM: DG HIP (WITH OR WITHOUT PELVIS) 1V RIGHT COMPARISON:  Sep 07, 2015 FINDINGS: Two images were obtained. On the first, the hip was still dislocated. On the second, the head of the femoral hardware appeared to be located within the acetabular component. No other acute abnormalities. IMPRESSION: Right hip re- location. Electronically Signed   By: Dorise Bullion III M.D   On: 09/07/2015 21:22   Dg Hip Unilat With Pelvis 1v Right  08/13/2015  CLINICAL DATA:  Intraoperative imaging of closed reduction of right hip dislocation. Initial encounter. EXAM: DG HIP (WITH OR WITHOUT PELVIS) 1V RIGHT; DG C-ARM 1-60 MIN-NO REPORT COMPARISON:  Plain films right hip 08/12/2015. FINDINGS: Right total hip arthroplasty is identified. The femoral head has been reduced. No new abnormality. IMPRESSION: Successful reduction of right hip  dislocation. Electronically Signed   By: Inge Rise M.D.   On: 08/13/2015 13:44   Dg Hip  Unilat With Pelvis 1v Right  08/10/2015  CLINICAL DATA:  Right hip deformity after fall EXAM: DG HIP (WITH OR WITHOUT PELVIS) 1V RIGHT COMPARISON:  06/12/2015 right hip radiographs FINDINGS: Right total hip arthroplasty. There is anterior/ superior dislocation of the right femoral head prosthesis at the right hip joint. No hardware fracture or loosening. No acute osseous fracture. No focal osseous lesion. No pelvic diastasis. IMPRESSION: Anterior superior dislocation of the right femoral head prosthesis at the right hip joint. No hardware or osseous fracture. Electronically Signed   By: Ilona Sorrel M.D.   On: 08/10/2015 17:10   Dg Hip Port Unilat With Pelvis 1v Right  09/08/2015  CLINICAL DATA:  Post reduction EXAM: DG HIP (WITH OR WITHOUT PELVIS) 1V PORT RIGHT COMPARISON:  09/08/2015 at 0149 hours FINDINGS: Interval reduction of the right hip arthroplasty. No definite fracture is seen.  Heterotopic calcification is present. IMPRESSION: Interval reduction of the right hip arthroplasty. Electronically Signed   By: Julian Hy M.D.   On: 09/08/2015 07:09   Dg Hip Port Unilat With Pelvis 1v Right  08/12/2015  CLINICAL DATA:  Postreduction right hip.  Initial encounter. EXAM: DG HIP (WITH OR WITHOUT PELVIS) 1V PORT RIGHT COMPARISON:  Earlier today FINDINGS: Total right hip arthroplasty remains superiorly dislocated. No visible periprosthetic fracture. IMPRESSION: Persistent superior dislocation of the prosthetic right hip. Electronically Signed   By: Monte Fantasia M.D.   On: 08/12/2015 22:32   Dg Hip Port Unilat With Pelvis 1v Right  08/10/2015  CLINICAL DATA:  Post reduction EXAM: DG HIP (WITH OR WITHOUT PELVIS) 1V PORT RIGHT COMPARISON:  None. FINDINGS: Interval reduction of the right hip arthroplasty. Mild degenerative changes of the left hip. Visualized bony pelvis appears intact. Degenerative changes  of the lower lumbar spine. IMPRESSION: Interval reduction of the right hip arthroplasty. Electronically Signed   By: Julian Hy M.D.   On: 08/10/2015 23:29   Dg Hip Unilat With Pelvis 2-3 Views Right  09/07/2015  CLINICAL DATA:  Heard pop at right hip while bending over. Concern for right hip prosthesis dislocation. Initial encounter. EXAM: DG HIP (WITH OR WITHOUT PELVIS) 2-3V RIGHT COMPARISON:  Right hip radiographs performed 08/12/2015 FINDINGS: There is recurrent superior dislocation of the patient's right hip prosthesis. No fracture is seen. Surrounding heterotopic bone formation is again noted. Mild axial joint space narrowing is noted at the left hip. Degenerative change is seen along the lumbar spine. Scattered phleboliths are seen. IMPRESSION: Recurrent superior dislocation of the right hip prosthesis. No evidence of fracture. Mild axial joint space narrowing at the left hip. Electronically Signed   By: Garald Balding M.D.   On: 09/07/2015 19:56   Dg Hip Unilat With Pelvis 2-3 Views Right  08/12/2015  CLINICAL DATA:  Right hip pain and limited range of motion after falling 2 days ago. Right total hip arthroplasty 3 months ago. EXAM: DG HIP (WITH OR WITHOUT PELVIS) 2-3V RIGHT COMPARISON:  Radiographs 08/10/2015. FINDINGS: There is no lateral view for the current study. Patient is status post right total hip arthroplasty. There is recurrent dislocation of the prosthesis with the femoral head located superolateral to the acetabulum. No evidence of acute fracture. Heterotopic ossification is noted around the right hip and ischium. There are stable degenerative changes of the left hip and lower lumbar spine. IMPRESSION: Recurrent dislocation of the right total hip arthroplasty without evidence of associated acute fracture. Electronically Signed   By: Richardean Sale M.D.   On: 08/12/2015 17:15  ASSESSMENT:  Unstable right THR   PLAN:  Has had multiple dislocations despite use of brace.  Xrays  show stable components with excellent alignment.  Will need revision surgery by Dr Mayer Camel potentially with constrained component.    Thomas [name] 09/08/2015, 11:00 AM

## 2015-09-08 NOTE — ED Notes (Signed)
Admitting at bedside 

## 2015-09-08 NOTE — H&P (Signed)
History and Physical    Thomas [name] [id number] DOB: Feb 13, 1951 DOA: 09/07/2015  PCP: Hoyt Koch, MD  Patient coming from:  Home   Chief Complaint:  discolated right hip HPI: Thomas [name] is a 65 y.o. male with medical history significant for but not necessarily limited to, ETOH abuse, AFib, chronic systolic heart failure (EF November was 40-45%) / dilated cardiomyopathy (ETOH) Patient has osteoarthritis of the right hip and is status post total hip arthroplasty in January of this year. He had dislocation of the prosthesis late April which ECP was able to reduce. Prosthesis dislocated againt again a few days later and patient was admitted for closed reduction of the right total hip dislocation. Patient brought in last evening by EMS for recurrent right hip dislocation. Patient felt hip pop when he bent over to tie shoe. He subsequently developed right hip pain every time he moved his hip  ED Course:  Imaging shows dislocation. Reduction performed by EDP, confirmed by x-rays. Plan was to discharge home from ED but hip became dislocated again prior to discharge.  EDP spoke with Ortho - Dr. Rhona Raider who will take patient to OR    Review of Systems: Positive for chronic bilateral lower extremity edema. As per HPI, otherwise 10 point review of systems negative.    Past Medical History  Diagnosis Date  . Obesity   . Arthritis   . Hypertension   . Chronic systolic CHF (congestive heart failure), NYHA class 2 ([place])   . DCM (dilated cardiomyopathy) ([place])     EF 30% but now improved to 40-45% by echo 2016 on medical therapy with moderate MR  . Pancytopenia ([place])   . ETOH abuse   . Alcoholic liver disease ([place])   . Gastroduodenitis     H Pylori positive  . Chronic atrial fibrillation ([name])     no an anticoagulation canditate due to alcohol abuse, liver cirrhosis with pancytopenia, medical noncompliance  . Diverticulosis of colon   . Portal hypertensive gastropathy   .  Personal history of colonic polyps - adenomas 08/21/2013  . Helicobacter pylori gastritis 10/19/2013  . Edema extremities 07/02/2015    Past Surgical History  Procedure Laterality Date  . Esophagogastroduodenoscopy N/A 08/21/2013    Procedure: ESOPHAGOGASTRODUODENOSCOPY (EGD);  Surgeon: Jerene Bears, MD;  Location: [facility] ENDOSCOPY;  Service: Endoscopy;  Laterality: N/A;  . Colonoscopy N/A 08/21/2013    Procedure: COLONOSCOPY;  Surgeon: Jerene Bears, MD;  Location: [facility] ENDOSCOPY;  Service: Endoscopy;  Laterality: N/A;  . Total hip arthroplasty Right 05/20/2015    Procedure: TOTAL HIP ARTHROPLASTY;  Surgeon: Frederik Pear, MD;  Location: [place];  Service: Orthopedics;  Laterality: Right;  . Hip closed reduction Right 08/13/2015    Procedure: CLOSED REDUCTION HIP;  Surgeon: [name], MD;  Location: [place] [facility];  Service: Orthopedics;  Laterality: Right;   Uses cane / walker   reports that he has never smoked. He has never used smokeless tobacco. He reports that he drinks about 4.8 oz of alcohol per week. He reports that he uses illicit drugs (Marijuana).  No Known Allergies  Family History  Problem Relation Age of Onset  . Hypertension Father     Prior to Admission medications   Medication Sig Start Date End Date Taking? Authorizing Provider  aspirin EC 325 MG tablet Take 1 tablet (325 mg total) by mouth 2 (two) times daily. 05/20/15  Yes Leighton Parody, PA-C  carvedilol (COREG) 12.5 MG tablet Take 1 tablet (12.5 mg  total) by mouth 2 (two) times daily with a meal. 07/02/15  Yes Sueanne Margarita, MD  folic acid (FOLVITE) 1 MG tablet Take 1 tablet (1 mg total) by mouth daily. 07/31/15  Yes Hoyt Koch, MD  furosemide (LASIX) 40 MG tablet Take 1 tablet 3 times daily for 4 days then decrease back down to 1 tablet twice daily Patient taking differently: Take 40 mg by mouth 2 (two) times daily. Take 1 tablet 3 times daily for 4 days then decrease back down to 1 tablet twice daily 08/26/15  Yes Isaiah Serge, NP  lidocaine (LIDODERM) 5 % Place 1 patch onto the skin 2 (two) times daily as needed. Remove & Discard patch within 12 hours or as directed by MD Patient taking differently: Place 1 patch onto the skin 2 (two) times daily as needed (for nerve pain). Remove & Discard patch within 12 hours or as directed by MD 09/02/15  Yes Hoyt Koch, MD  losartan (COZAAR) 25 MG tablet Take 0.5 tablets (12.5 mg total) by mouth daily. 07/31/15  Yes Hoyt Koch, MD  Multiple Vitamin (MULTIVITAMIN WITH MINERALS) TABS tablet Take 1 tablet by mouth daily. 08/24/13  Yes Geradine Girt, DO  potassium chloride SA (K-DUR,KLOR-CON) 20 MEQ tablet Take 2 tablets (40 mEq total) by mouth daily. 08/26/15  Yes Isaiah Serge, NP  thiamine 100 MG tablet Take 1 tablet (100 mg total) by mouth daily. 07/31/15  Yes Hoyt Koch, MD  HYDROcodone-acetaminophen (NORCO/VICODIN) 5-325 MG tablet Take 1-2 tablets by mouth every 4 (four) hours as needed. 09/07/15   Wandra Arthurs, MD  methocarbamol (ROBAXIN) 500 MG tablet Take 1 tablet (500 mg total) by mouth 2 (two) times daily with a meal. 05/20/15   Leighton Parody, PA-C    Physical Exam: Filed Vitals:   09/08/15 0615 09/08/15 0630 09/08/15 0645 09/08/15 0700  BP:      Pulse: 99 96 90 93  Temp:      TempSrc:      Resp: 20 14 26 16   SpO2: 100% 100% 100% 100%    Constitutional:  Pleasant black male in NAD, calm, comfortable Filed Vitals:   09/08/15 0615 09/08/15 0630 09/08/15 0645 09/08/15 0700  BP:      Pulse: 99 96 90 93  Temp:      TempSrc:      Resp: 20 14 26 16   SpO2: 100% 100% 100% 100%   Eyes: PER, lids and conjunctivae normal ENMT: Mucous membranes are moist. Posterior pharynx clear of any exudate or lesions.Normal dentition.  Neck: normal, supple, no masses Respiratory: clear to auscultation bilaterally, no wheezing, no crackles. Normal respiratory effort. No accessory muscle use.  Cardiovascular: AFib on monitor, rate upper 90's. Bilateral  lower extremity edema,  2+ pedal pulses. Abdomen: no tenderness, no masses palpated. No hepatomegaly. Bowel sounds positive.  Musculoskeletal: no clubbing / cyanosis. RLE in brace.   Skin: no rashes, lesions, ulcers.  Neurologic: CN 2-12 grossly intact. Sensation intact, Strength 4/5 in all all extremities except RLE (not tested).  Psychiatric: Normal judgment and insight. Alert and oriented x 3. Normal mood.   Labs on Admission: I have personally reviewed following labs and imaging studies  CBC:  Recent Labs Lab 09/07/15 1855  WBC 5.5  NEUTROABS 3.3  HGB 10.4*  HCT 32.2*  MCV 98.5  PLT [id number]*   Basic Metabolic Panel:  Recent Labs Lab 09/07/15 1855  NA 137  K 4.0  CL 99*  CO2 26  GLUCOSE 98  BUN 14  CREATININE 1.09  CALCIUM 8.9   GFR: Estimated Creatinine Clearance: 100.7 mL/min (by C-G formula based on Cr of 1.09). Coagulation Profile:  Recent Labs Lab 09/07/15 1855  INR 1.22     Radiological Exams on Admission: Dg Hip Unilat With Pelvis 1v Right  09/08/2015  CLINICAL DATA:  Acute onset of right hip pain.  Initial encounter. EXAM: DG HIP (WITH OR WITHOUT PELVIS) 1V RIGHT COMPARISON:  Right hip radiographs performed 09/07/2015 FINDINGS: There is superior dislocation of the right hip prosthesis. No fracture is seen. There is chronic superior displacement of the right hip prosthesis, likely degenerative in nature. There is mild axial joint space narrowing at the left hip. Degenerative change is noted about the pubic symphysis. Degenerative change is seen along the lower lumbar spine. Scattered phleboliths are seen. IMPRESSION: Superior dislocation of the right hip prosthesis. Electronically Signed   By: Garald Balding M.D.   On: 09/08/2015 02:34   Dg Hip Unilat With Pelvis 1v Right  09/07/2015  CLINICAL DATA:  Status post hip reduction EXAM: DG HIP (WITH OR WITHOUT PELVIS) 1V RIGHT COMPARISON:  Sep 07, 2015 FINDINGS: Two images were obtained. On the first, the hip was  still dislocated. On the second, the head of the femoral hardware appeared to be located within the acetabular component. No other acute abnormalities. IMPRESSION: Right hip re- location. Electronically Signed   By: Dorise Bullion III M.D   On: 09/07/2015 21:22   Dg Hip Port Unilat With Pelvis 1v Right  09/08/2015  CLINICAL DATA:  Post reduction EXAM: DG HIP (WITH OR WITHOUT PELVIS) 1V PORT RIGHT COMPARISON:  09/08/2015 at 0149 hours FINDINGS: Interval reduction of the right hip arthroplasty. No definite fracture is seen.  Heterotopic calcification is present. IMPRESSION: Interval reduction of the right hip arthroplasty. Electronically Signed   By: Julian Hy M.D.   On: 09/08/2015 07:09   Dg Hip Unilat With Pelvis 2-3 Views Right  09/07/2015  CLINICAL DATA:  Heard pop at right hip while bending over. Concern for right hip prosthesis dislocation. Initial encounter. EXAM: DG HIP (WITH OR WITHOUT PELVIS) 2-3V RIGHT COMPARISON:  Right hip radiographs performed 08/12/2015 FINDINGS: There is recurrent superior dislocation of the patient's right hip prosthesis. No fracture is seen. Surrounding heterotopic bone formation is again noted. Mild axial joint space narrowing is noted at the left hip. Degenerative change is seen along the lumbar spine. Scattered phleboliths are seen. IMPRESSION: Recurrent superior dislocation of the right hip prosthesis. No evidence of fracture. Mild axial joint space narrowing at the left hip. Electronically Signed   By: Garald Balding M.D.   On: 09/07/2015 19:56     Echocardiogram November 2016 - EF 40-45% w/ diffuse hypokinesis, moderate mitral regurg.      Assessment/Plan   Active Problems:   Chronic atrial fibrillation ([name])   ETOH abuse   Chronic systolic CHF (congestive heart failure), NYHA class 2 ([name])   Hypertension   Edema extremities   Recurrent dislocation of hip joint prosthesis ([name])   Hip dislocation, right ([name])   Right hip dislocation, recurrent.  Hip reduced in ED but dislocated again prior to ED discharge -Place in OBS-Telemetry -EDP spoke with Ortho, Dr. Rhona Raider to see patient. Plan is for OR -preop CXR, u/a  Chronic atrial fibrillation: Not on anti-coag secondary to ETOH abuse and history of thrombocytopenia.  - Continue Coreg -Home med list reads ASA 324 BID but he is only taking once  daily. Will hold  daily ASA since going to OR tomorrow. Will need to be resumed when okay with Ortho  -monitor on tele  Systolic CHF: last EF [id number] in 02/2015. BLE pitting edema. EF 40-45% on last echo Nov 2016 - Cardiologist made recent short term increase in lasix, no improvement in pitting edema. Now back on usual dose of lasix 40mg  dose.  -Daily weights, I&O  -continue Coreg -BNP -await CXR  ETOH abuse.  -CIWA  Thrombocytopenia, chronic. Count stable at 115. Bone marrow suppression? Underlying cirrhosis CTA of chest in 2015 suggested steatosis and / or cirrhosis.  -check LFTs -Minimally elevated PT   HTN. BP on low side in ED.  -hold Cozaar -Will cautiously continue beta blocker -Continue home lasix given edema  DVT prophylaxis:  Heparin - asked Pharmacy to advise on dosing given platelets of 115. Hold after midnight for surgery Code Status:   Full code Family Communication: none.  Disposition Plan:  Discharge home  24-48 hours         Consults called: Orthopedics Admission status:  Observation - Medical bed  Tye Savoy NP [facility] Pager [phone]  If 7PM-7AM, please contact night-coverage [email or web address] Password [facility]  09/08/2015, 7:31 AM

## 2015-09-08 NOTE — ED Notes (Signed)
Transported to xray 

## 2015-09-08 NOTE — ED Notes (Signed)
Pt went to get up for discharge and c/o his right hip "not feeling right". Notified EDP of the same, new xray put in at this time.

## 2015-09-08 NOTE — Progress Notes (Signed)
Orthopedic Tech Progress Note Patient Details:  Thomas [name] [birth year] [id number]  Patient ID: Thomas [name], male   DOB: Mar 11, 1951, 65 y.o.   MRN: [id number] Assisted dr with hip reduction  Karolee Stamps 09/08/2015, 4:59 AM

## 2015-09-09 ENCOUNTER — Inpatient Hospital Stay (HOSPITAL_COMMUNITY): Payer: Commercial Managed Care - HMO

## 2015-09-09 ENCOUNTER — Observation Stay (HOSPITAL_COMMUNITY): Payer: Commercial Managed Care - HMO | Admitting: Anesthesiology

## 2015-09-09 ENCOUNTER — Encounter (HOSPITAL_COMMUNITY)
Admission: EM | Disposition: A | Discharge status: Home Health | Payer: Self-pay | Source: Home / Self Care | Attending: Internal Medicine

## 2015-09-09 ENCOUNTER — Encounter (HOSPITAL_COMMUNITY): Payer: Self-pay | Admitting: Certified Registered"

## 2015-09-09 DIAGNOSIS — I482 Chronic atrial fibrillation: Secondary | ICD-10-CM | POA: Diagnosis present

## 2015-09-09 DIAGNOSIS — Z9660 Presence of unspecified orthopedic joint implant: Secondary | ICD-10-CM | POA: Diagnosis not present

## 2015-09-09 DIAGNOSIS — I5022 Chronic systolic (congestive) heart failure: Secondary | ICD-10-CM | POA: Diagnosis present

## 2015-09-09 DIAGNOSIS — K766 Portal hypertension: Secondary | ICD-10-CM | POA: Diagnosis present

## 2015-09-09 DIAGNOSIS — I10 Essential (primary) hypertension: Secondary | ICD-10-CM

## 2015-09-09 DIAGNOSIS — K3189 Other diseases of stomach and duodenum: Secondary | ICD-10-CM | POA: Diagnosis present

## 2015-09-09 DIAGNOSIS — Z7982 Long term (current) use of aspirin: Secondary | ICD-10-CM | POA: Diagnosis not present

## 2015-09-09 DIAGNOSIS — T84020A Dislocation of internal right hip prosthesis, initial encounter: Secondary | ICD-10-CM | POA: Diagnosis present

## 2015-09-09 DIAGNOSIS — F1010 Alcohol abuse, uncomplicated: Secondary | ICD-10-CM

## 2015-09-09 DIAGNOSIS — I110 Hypertensive heart disease with heart failure: Secondary | ICD-10-CM | POA: Diagnosis present

## 2015-09-09 DIAGNOSIS — I420 Dilated cardiomyopathy: Secondary | ICD-10-CM | POA: Diagnosis present

## 2015-09-09 DIAGNOSIS — M24451 Recurrent dislocation, right hip: Secondary | ICD-10-CM | POA: Diagnosis present

## 2015-09-09 DIAGNOSIS — Y792 Prosthetic and other implants, materials and accessory orthopedic devices associated with adverse incidents: Secondary | ICD-10-CM | POA: Diagnosis present

## 2015-09-09 DIAGNOSIS — E669 Obesity, unspecified: Secondary | ICD-10-CM | POA: Diagnosis present

## 2015-09-09 DIAGNOSIS — S73004A Unspecified dislocation of right hip, initial encounter: Secondary | ICD-10-CM | POA: Diagnosis not present

## 2015-09-09 DIAGNOSIS — K7030 Alcoholic cirrhosis of liver without ascites: Secondary | ICD-10-CM | POA: Diagnosis present

## 2015-09-09 DIAGNOSIS — D696 Thrombocytopenia, unspecified: Secondary | ICD-10-CM | POA: Diagnosis present

## 2015-09-09 DIAGNOSIS — Z79899 Other long term (current) drug therapy: Secondary | ICD-10-CM | POA: Diagnosis not present

## 2015-09-09 DIAGNOSIS — Z96649 Presence of unspecified artificial hip joint: Secondary | ICD-10-CM

## 2015-09-09 DIAGNOSIS — Z6841 Body Mass Index (BMI) 40.0 and over, adult: Secondary | ICD-10-CM | POA: Diagnosis not present

## 2015-09-09 HISTORY — PX: TOTAL HIP REVISION: SHX763

## 2015-09-09 LAB — BASIC METABOLIC PANEL
ANION GAP: 6 (ref 5–15)
BUN: 10 mg/dL (ref 6–20)
CHLORIDE: 103 mmol/L (ref 101–111)
CO2: 28 mmol/L (ref 22–32)
Calcium: 8.7 mg/dL — ABNORMAL LOW (ref 8.9–10.3)
Creatinine, Ser: 0.97 mg/dL (ref 0.61–1.24)
GFR calc Af Amer: >60 mL/min (ref >60)
GLUCOSE: 108 mg/dL — AB (ref 65–99)
POTASSIUM: 3.9 mmol/L (ref 3.5–5.1)
Sodium: 137 mmol/L (ref 135–145)

## 2015-09-09 LAB — CBC
HEMATOCRIT: 26.8 % — AB (ref 39.0–52.0)
HEMOGLOBIN: 8.8 g/dL — AB (ref 13.0–17.0)
MCH: 31.8 pg (ref 26.0–34.0)
MCHC: 32.8 g/dL (ref 30.0–36.0)
MCV: 96.8 fL (ref 78.0–100.0)
Platelets: 93 10*3/uL — ABNORMAL LOW (ref 150–400)
RBC: 2.77 MIL/uL — ABNORMAL LOW (ref 4.22–5.81)
RDW: 15.5 % (ref 11.5–15.5)
WBC: 6.6 10*3/uL (ref 4.0–10.5)

## 2015-09-09 LAB — PREPARE RBC (CROSSMATCH)

## 2015-09-09 SURGERY — TOTAL HIP REVISION
Anesthesia: General | Site: Hip | Laterality: Right

## 2015-09-09 MED ORDER — SUCCINYLCHOLINE CHLORIDE 200 MG/10ML IV SOSY
PREFILLED_SYRINGE | INTRAVENOUS | Status: AC
  Filled 2015-09-09: qty 10

## 2015-09-09 MED ORDER — FLEET ENEMA 7-19 GM/118ML RE ENEM: 1.0000 | ENEMA | Freq: Once | RECTAL | Status: DC | PRN

## 2015-09-09 MED ORDER — LACTATED RINGERS IV SOLN
INTRAVENOUS | Status: DC
  Administered 2015-09-09: 11:00:00 via INTRAVENOUS

## 2015-09-09 MED ORDER — FUROSEMIDE 40 MG PO TABS
40.0000 mg | ORAL_TABLET | Freq: Two times a day (BID) | ORAL | Status: DC
  Administered 2015-09-09 – 2015-09-10 (×2): 40 mg via ORAL
  Filled 2015-09-09 (×2): qty 1

## 2015-09-09 MED ORDER — PHENYLEPHRINE 40 MCG/ML (10ML) SYRINGE FOR IV PUSH (FOR BLOOD PRESSURE SUPPORT)
PREFILLED_SYRINGE | INTRAVENOUS | Status: AC
  Filled 2015-09-09: qty 30

## 2015-09-09 MED ORDER — FENTANYL CITRATE (PF) 250 MCG/5ML IJ SOLN
INTRAMUSCULAR | Status: AC
  Filled 2015-09-09: qty 5

## 2015-09-09 MED ORDER — SODIUM CHLORIDE 0.9 % IV SOLN
INTRAVENOUS | Status: DC | PRN
  Administered 2015-09-09: 12:00:00 via INTRAVENOUS

## 2015-09-09 MED ORDER — 0.9 % SODIUM CHLORIDE (POUR BTL) OPTIME
TOPICAL | Status: DC | PRN
  Administered 2015-09-09: 1000 mL

## 2015-09-09 MED ORDER — PHENYLEPHRINE HCL 10 MG/ML IJ SOLN
INTRAMUSCULAR | Status: DC | PRN
  Administered 2015-09-09 (×6): 80 ug via INTRAVENOUS

## 2015-09-09 MED ORDER — METOCLOPRAMIDE HCL 5 MG PO TABS: 5.0000 mg | ORAL_TABLET | Freq: Three times a day (TID) | ORAL | Status: DC | PRN

## 2015-09-09 MED ORDER — OXYCODONE-ACETAMINOPHEN 5-325 MG PO TABS: 1.0000 | ORAL_TABLET | ORAL | Status: DC | PRN

## 2015-09-09 MED ORDER — ASPIRIN EC 325 MG PO TBEC
325.0000 mg | DELAYED_RELEASE_TABLET | Freq: Every day | ORAL | Status: DC
  Administered 2015-09-10: 325 mg via ORAL
  Filled 2015-09-09: qty 1

## 2015-09-09 MED ORDER — SODIUM CHLORIDE 0.9 % IR SOLN
Status: DC | PRN
  Administered 2015-09-09: 3000 mL

## 2015-09-09 MED ORDER — OXYCODONE HCL 5 MG PO TABS: 5.0000 mg | ORAL_TABLET | ORAL | Status: DC | PRN

## 2015-09-09 MED ORDER — PROPOFOL 10 MG/ML IV BOLUS
INTRAVENOUS | Status: DC | PRN
  Administered 2015-09-09: 200 mg via INTRAVENOUS

## 2015-09-09 MED ORDER — METHOCARBAMOL 1000 MG/10ML IJ SOLN
500.0000 mg | Freq: Four times a day (QID) | INTRAVENOUS | Status: DC | PRN
  Filled 2015-09-09: qty 5

## 2015-09-09 MED ORDER — ROCURONIUM BROMIDE 50 MG/5ML IV SOLN
INTRAVENOUS | Status: AC
  Filled 2015-09-09: qty 1

## 2015-09-09 MED ORDER — BUPIVACAINE-EPINEPHRINE 0.5% -1:200000 IJ SOLN
INTRAMUSCULAR | Status: DC | PRN
Start: 2015-09-09 — End: 2015-09-09
  Administered 2015-09-09: 30 mL

## 2015-09-09 MED ORDER — DOCUSATE SODIUM 100 MG PO CAPS
100.0000 mg | ORAL_CAPSULE | Freq: Two times a day (BID) | ORAL | Status: DC
  Administered 2015-09-09 – 2015-09-10 (×2): 100 mg via ORAL
  Filled 2015-09-09 (×3): qty 1

## 2015-09-09 MED ORDER — ONDANSETRON HCL 4 MG/2ML IJ SOLN
INTRAMUSCULAR | Status: AC
  Filled 2015-09-09: qty 2

## 2015-09-09 MED ORDER — METOCLOPRAMIDE HCL 5 MG/ML IJ SOLN
5.0000 mg | Freq: Three times a day (TID) | INTRAMUSCULAR | Status: DC | PRN
Start: 2015-09-09 — End: 2015-09-10

## 2015-09-09 MED ORDER — ONDANSETRON HCL 4 MG/2ML IJ SOLN
INTRAMUSCULAR | Status: DC | PRN
  Administered 2015-09-09: 4 mg via INTRAVENOUS

## 2015-09-09 MED ORDER — MIDAZOLAM HCL 2 MG/2ML IJ SOLN
INTRAMUSCULAR | Status: AC
  Filled 2015-09-09: qty 2

## 2015-09-09 MED ORDER — ASPIRIN EC 325 MG PO TBEC: 325.0000 mg | DELAYED_RELEASE_TABLET | Freq: Two times a day (BID) | ORAL | Status: AC

## 2015-09-09 MED ORDER — METHOCARBAMOL 500 MG PO TABS: 500.0000 mg | ORAL_TABLET | Freq: Two times a day (BID) | ORAL | Status: DC

## 2015-09-09 MED ORDER — DIPHENHYDRAMINE HCL 12.5 MG/5ML PO ELIX: 12.5000 mg | ORAL_SOLUTION | ORAL | Status: DC | PRN

## 2015-09-09 MED ORDER — ALUM & MAG HYDROXIDE-SIMETH 200-200-20 MG/5ML PO SUSP: 30.0000 mL | ORAL | Status: DC | PRN

## 2015-09-09 MED ORDER — SODIUM CHLORIDE 0.9 % IV SOLN: Freq: Once | INTRAVENOUS | Status: DC

## 2015-09-09 MED ORDER — PHENOL 1.4 % MT LIQD: 1.0000 | OROMUCOSAL | Status: DC | PRN

## 2015-09-09 MED ORDER — BUPIVACAINE-EPINEPHRINE (PF) 0.5% -1:200000 IJ SOLN
INTRAMUSCULAR | Status: AC
  Filled 2015-09-09: qty 30

## 2015-09-09 MED ORDER — DEXAMETHASONE SODIUM PHOSPHATE 10 MG/ML IJ SOLN
10.0000 mg | Freq: Once | INTRAMUSCULAR | Status: AC
  Administered 2015-09-10: 10 mg via INTRAVENOUS
  Filled 2015-09-09: qty 1

## 2015-09-09 MED ORDER — HYDROMORPHONE HCL 1 MG/ML IJ SOLN: 1.0000 mg | INTRAMUSCULAR | Status: DC | PRN

## 2015-09-09 MED ORDER — MENTHOL 3 MG MT LOZG: 1.0000 | LOZENGE | OROMUCOSAL | Status: DC | PRN

## 2015-09-09 MED ORDER — LIDOCAINE 2% (20 MG/ML) 5 ML SYRINGE
INTRAMUSCULAR | Status: AC
  Filled 2015-09-09: qty 10

## 2015-09-09 MED ORDER — KCL IN DEXTROSE-NACL 20-5-0.45 MEQ/L-%-% IV SOLN: INTRAVENOUS | Status: DC

## 2015-09-09 MED ORDER — CEFUROXIME SODIUM 1.5 G IJ SOLR
INTRAMUSCULAR | Status: AC
  Filled 2015-09-09: qty 1.5

## 2015-09-09 MED ORDER — METHOCARBAMOL 500 MG PO TABS: 500.0000 mg | ORAL_TABLET | Freq: Four times a day (QID) | ORAL | Status: DC | PRN

## 2015-09-09 MED ORDER — FENTANYL CITRATE (PF) 100 MCG/2ML IJ SOLN
INTRAMUSCULAR | Status: DC | PRN
  Administered 2015-09-09: 150 ug via INTRAVENOUS

## 2015-09-09 MED ORDER — FENTANYL CITRATE (PF) 100 MCG/2ML IJ SOLN: 25.0000 ug | INTRAMUSCULAR | Status: DC | PRN

## 2015-09-09 MED ORDER — TRANEXAMIC ACID 1000 MG/10ML IV SOLN
2000.0000 mg | INTRAVENOUS | Status: AC
  Administered 2015-09-09: 2000 mg via TOPICAL
  Filled 2015-09-09: qty 20

## 2015-09-09 MED ORDER — PROPOFOL 10 MG/ML IV BOLUS
INTRAVENOUS | Status: AC
  Filled 2015-09-09: qty 40

## 2015-09-09 MED ORDER — MIDAZOLAM HCL 5 MG/5ML IJ SOLN
INTRAMUSCULAR | Status: DC | PRN
  Administered 2015-09-09: 2 mg via INTRAVENOUS

## 2015-09-09 SURGICAL SUPPLY — 71 items
BAG DECANTER FOR FLEXI CONT (MISCELLANEOUS) ×2 IMPLANT
BLADE SAW SAG 73X25 THK (BLADE)
BLADE SAW SGTL 73X25 THK (BLADE) IMPLANT
BNDG COHESIVE 6X5 TAN STRL LF (GAUZE/BANDAGES/DRESSINGS) ×2 IMPLANT
BRUSH FEMORAL CANAL (MISCELLANEOUS) IMPLANT
COVER SURGICAL LIGHT HANDLE (MISCELLANEOUS) ×2 IMPLANT
DRAPE C-ARM 42X72 X-RAY (DRAPES) IMPLANT
DRAPE ORTHO SPLIT 77X108 STRL (DRAPES) ×1
DRAPE PROXIMA HALF (DRAPES) ×2 IMPLANT
DRAPE SURG ORHT 6 SPLT 77X108 (DRAPES) ×1 IMPLANT
DRAPE U-SHAPE 47X51 STRL (DRAPES) ×2 IMPLANT
DRILL BIT 7/64X5 (BIT) ×2 IMPLANT
DRSG AQUACEL AG ADV 3.5X10 (GAUZE/BANDAGES/DRESSINGS) ×2 IMPLANT
DRSG AQUACEL AG ADV 3.5X14 (GAUZE/BANDAGES/DRESSINGS) ×2 IMPLANT
DURAPREP 26ML APPLICATOR (WOUND CARE) ×4 IMPLANT
ELECT BLADE 4.0 EZ CLEAN MEGAD (MISCELLANEOUS)
ELECT BLADE 6.5 EXT (BLADE) ×2 IMPLANT
ELECT REM PT RETURN 9FT ADLT (ELECTROSURGICAL) ×2
ELECTRODE BLDE 4.0 EZ CLN MEGD (MISCELLANEOUS) IMPLANT
ELECTRODE REM PT RTRN 9FT ADLT (ELECTROSURGICAL) ×1 IMPLANT
EVACUATOR 1/8 PVC DRAIN (DRAIN) IMPLANT
GAUZE SPONGE 4X4 12PLY STRL (GAUZE/BANDAGES/DRESSINGS) IMPLANT
GAUZE XEROFORM 5X9 LF (GAUZE/BANDAGES/DRESSINGS) IMPLANT
GLOVE BIO SURGEON STRL SZ7.5 (GLOVE) ×2 IMPLANT
GLOVE BIO SURGEON STRL SZ8.5 (GLOVE) ×4 IMPLANT
GLOVE BIOGEL PI IND STRL 8 (GLOVE) ×2 IMPLANT
GLOVE BIOGEL PI IND STRL 9 (GLOVE) ×1 IMPLANT
GLOVE BIOGEL PI INDICATOR 8 (GLOVE) ×2
GLOVE BIOGEL PI INDICATOR 9 (GLOVE) ×1
GOWN STRL REUS W/ TWL LRG LVL3 (GOWN DISPOSABLE) ×2 IMPLANT
GOWN STRL REUS W/ TWL XL LVL3 (GOWN DISPOSABLE) ×2 IMPLANT
GOWN STRL REUS W/TWL LRG LVL3 (GOWN DISPOSABLE) ×2
GOWN STRL REUS W/TWL XL LVL3 (GOWN DISPOSABLE) ×2
HANDPIECE INTERPULSE COAX TIP (DISPOSABLE) ×1
HEAD METAL ON METAL PLUS 6MM (Hips) ×2 IMPLANT
HOOD PEEL AWAY FACE SHEILD DIS (HOOD) ×4 IMPLANT
KIT BASIN OR (CUSTOM PROCEDURE TRAY) ×2 IMPLANT
KIT ROOM TURNOVER OR (KITS) ×2 IMPLANT
LINER ACET PINNACLE 36MM (Hips) ×2 IMPLANT
MANIFOLD NEPTUNE II (INSTRUMENTS) ×2 IMPLANT
NEEDLE 22X1 1/2 (OR ONLY) (NEEDLE) ×2 IMPLANT
NEEDLE SPNL 18GX3.5 QUINCKE PK (NEEDLE) ×2 IMPLANT
NS IRRIG 1000ML POUR BTL (IV SOLUTION) ×2 IMPLANT
PACK TOTAL JOINT (CUSTOM PROCEDURE TRAY) ×2 IMPLANT
PAD ARMBOARD 7.5X6 YLW CONV (MISCELLANEOUS) ×4 IMPLANT
PASSER SUT SWANSON 36MM LOOP (INSTRUMENTS) ×2 IMPLANT
PRESSURIZER FEMORAL UNIV (MISCELLANEOUS) IMPLANT
SET HNDPC FAN SPRY TIP SCT (DISPOSABLE) ×1 IMPLANT
SLEEVE SURGEON STRL (DRAPES) IMPLANT
SPONGE LAP 18X18 X RAY DECT (DISPOSABLE) ×8 IMPLANT
STAPLER VISISTAT 35W (STAPLE) ×2 IMPLANT
SUT ETHIBOND 2 V 37 (SUTURE) ×2 IMPLANT
SUT VIC AB 0 CTX 36 (SUTURE) ×1
SUT VIC AB 0 CTX36XBRD ANTBCTR (SUTURE) ×1 IMPLANT
SUT VIC AB 1 CTX 36 (SUTURE) ×1
SUT VIC AB 1 CTX36XBRD ANBCTR (SUTURE) ×1 IMPLANT
SUT VIC AB 2-0 CT1 27 (SUTURE) ×1
SUT VIC AB 2-0 CT1 TAPERPNT 27 (SUTURE) ×1 IMPLANT
SUT VIC AB 3-0 CT1 27 (SUTURE) ×1
SUT VIC AB 3-0 CT1 TAPERPNT 27 (SUTURE) ×1 IMPLANT
SUT VIC AB 3-0 SH 27 (SUTURE) ×1
SUT VIC AB 3-0 SH 27X BRD (SUTURE) ×1 IMPLANT
SWAB COLLECTION DEVICE MRSA (MISCELLANEOUS) IMPLANT
SYR 20ML ECCENTRIC (SYRINGE) ×2 IMPLANT
SYR CONTROL 10ML LL (SYRINGE) ×2 IMPLANT
TOWEL OR 17X24 6PK STRL BLUE (TOWEL DISPOSABLE) ×2 IMPLANT
TOWEL OR 17X26 10 PK STRL BLUE (TOWEL DISPOSABLE) ×2 IMPLANT
TOWER CARTRIDGE SMART MIX (DISPOSABLE) IMPLANT
TRAY FOLEY CATH 14FR (SET/KITS/TRAYS/PACK) IMPLANT
TUBE ANAEROBIC SPECIMEN COL (MISCELLANEOUS) IMPLANT
WATER STERILE IRR 1000ML POUR (IV SOLUTION) IMPLANT

## 2015-09-09 NOTE — Discharge Instructions (Signed)
Take vicodin for pain.   Wear your brace at all times.   See your orthopedic doctor.  Return to ER if you have severe pain, dislocation.   INSTRUCTIONS AFTER JOINT REPLACEMENT   o Remove items at home which could result in a fall. This includes throw rugs or furniture in walking pathways o ICE to the affected joint every three hours while awake for 30 minutes at a time, for at least the first 3-5 days, and then as needed for pain and swelling.  Continue to use ice for pain and swelling. You may notice swelling that will progress down to the foot and ankle.  This is normal after surgery.  Elevate your leg when you are not up walking on it.   o Continue to use the breathing machine you got in the hospital (incentive spirometer) which will help keep your temperature down.  It is common for your temperature to cycle up and down following surgery, especially at night when you are not up moving around and exerting yourself.  The breathing machine keeps your lungs expanded and your temperature down.   DIET:  As you were doing prior to hospitalization, we recommend a well-balanced diet.  DRESSING / WOUND CARE / SHOWERING  Keep the surgical dressing until follow up.  The dressing is water proof, so you can shower without any extra covering.  IF THE DRESSING FALLS OFF or the wound gets wet inside, change the dressing with sterile gauze.  Please use good hand washing techniques before changing the dressing.  Do not use any lotions or creams on the incision until instructed by your surgeon.    ACTIVITY  o Increase activity slowly as tolerated, but follow the weight bearing instructions below.   o No driving for 6 weeks or until further direction given by your physician.  You cannot drive while taking narcotics.  o No lifting or carrying greater than 10 lbs. until further directed by your surgeon. o Avoid periods of inactivity such as sitting longer than an hour when not asleep. This helps prevent blood  clots.  o You may return to work once you are authorized by your doctor.     WEIGHT BEARING   Weight bearing as tolerated with assist device (walker, cane, etc) as directed, use it as long as suggested by your surgeon or therapist, typically at least 4-6 weeks.   EXERCISES  Results after joint replacement surgery are often greatly improved when you follow the exercise, range of motion and muscle strengthening exercises prescribed by your doctor. Safety measures are also important to protect the joint from further injury. Any time any of these exercises cause you to have increased pain or swelling, decrease what you are doing until you are comfortable again and then slowly increase them. If you have problems or questions, call your caregiver or physical therapist for advice.   Rehabilitation is important following a joint replacement. After just a few days of immobilization, the muscles of the leg can become weakened and shrink (atrophy).  These exercises are designed to build up the tone and strength of the thigh and leg muscles and to improve motion. Often times heat used for twenty to thirty minutes before working out will loosen up your tissues and help with improving the range of motion but do not use heat for the first two weeks following surgery (sometimes heat can increase post-operative swelling).   These exercises can be done on a training (exercise) mat, on the floor, on  a table or on a bed. Use whatever works the best and is most comfortable for you.    Use music or television while you are exercising so that the exercises are a pleasant break in your day. This will make your life better with the exercises acting as a break in your routine that you can look forward to.   Perform all exercises about fifteen times, three times per day or as directed.  You should exercise both the operative leg and the other leg as well.  Exercises include:    Quad Sets - Tighten up the muscle on the  front of the thigh (Quad) and hold for 5-10 seconds.    Straight Leg Raises - With your knee straight (if you were given a brace, keep it on), lift the leg to 60 degrees, hold for 3 seconds, and slowly lower the leg.  Perform this exercise against resistance later as your leg gets stronger.   Leg Slides: Lying on your back, slowly slide your foot toward your buttocks, bending your knee up off the floor (only go as far as is comfortable). Then slowly slide your foot back down until your leg is flat on the floor again.   Angel Wings: Lying on your back spread your legs to the side as far apart as you can without causing discomfort.   Hamstring Strength:  Lying on your back, push your heel against the floor with your leg straight by tightening up the muscles of your buttocks.  Repeat, but this time bend your knee to a comfortable angle, and push your heel against the floor.  You may put a pillow under the heel to make it more comfortable if necessary.   A rehabilitation program following joint replacement surgery can speed recovery and prevent re-injury in the future due to weakened muscles. Contact your doctor or a physical therapist for more information on knee rehabilitation.    CONSTIPATION  Constipation is defined medically as fewer than three stools per week and severe constipation as less than one stool per week.  Even if you have a regular bowel pattern at home, your normal regimen is likely to be disrupted due to multiple reasons following surgery.  Combination of anesthesia, postoperative narcotics, change in appetite and fluid intake all can affect your bowels.   YOU MUST use at least one of the following options; they are listed in order of increasing strength to get the job done.  They are all available over the counter, and you may need to use some, POSSIBLY even all of these options:    Drink plenty of fluids (prune juice may be helpful) and high fiber foods Colace 100 mg by mouth  twice a day  Senokot for constipation as directed and as needed Dulcolax (bisacodyl), take with full glass of water  Miralax (polyethylene glycol) once or twice a day as needed.  If you have tried all these things and are unable to have a bowel movement in the first 3-4 days after surgery call either your surgeon or your primary doctor.    If you experience loose stools or diarrhea, hold the medications until you stool forms back up.  If your symptoms do not get better within 1 week or if they get worse, check with your doctor.  If you experience "the worst abdominal pain ever" or develop nausea or vomiting, please contact the office immediately for further recommendations for treatment.   ITCHING:  If you experience itching with your medications,  try taking only a single pain pill, or even half a pain pill at a time.  You can also use Benadryl over the counter for itching or also to help with sleep.   TED HOSE STOCKINGS:  Use stockings on both legs until for at least 2 weeks or as directed by physician office. They may be removed at night for sleeping.  MEDICATIONS:  See your medication summary on the After Visit Summary that nursing will review with you.  You may have some home medications which will be placed on hold until you complete the course of blood thinner medication.  It is important for you to complete the blood thinner medication as prescribed.  PRECAUTIONS:  If you experience chest pain or shortness of breath - call 911 immediately for transfer to the hospital emergency department.   If you develop a fever greater that 101 F, purulent drainage from wound, increased redness or drainage from wound, foul odor from the wound/dressing, or calf pain - CONTACT YOUR SURGEON.                                                   FOLLOW-UP APPOINTMENTS:  If you do not already have a post-op appointment, please call the office for an appointment to be seen by your surgeon.  Guidelines for how  soon to be seen are listed in your After Visit Summary, but are typically between 1-4 weeks after surgery.  OTHER INSTRUCTIONS:   Knee Replacement:  Do not place pillow under knee, focus on keeping the knee straight while resting. CPM instructions: 0-90 degrees, 2 hours in the morning, 2 hours in the afternoon, and 2 hours in the evening. Place foam block, curve side up under heel at all times except when in CPM or when walking.  DO NOT modify, tear, cut, or change the foam block in any way.  MAKE SURE YOU:   Understand these instructions.   Get help right away if you are not doing well or get worse.    Thank you for letting us be a part of your medical care team.  It is a privilege we respect greatly.  We hope these instructions will help you stay on track for a fast and full recovery!

## 2015-09-09 NOTE — Consult Note (Signed)
Reason for Consult: Current dislocations of geometrically stable right total hip placed January 2017 Referring Physician: Dr. Rosemary Holms Taul is an 65 y.o. male.  HPI: 65 year old man underwent right total up arthroplasty posterior lateral approach on 05/20/2015. 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 Postoperative x-rays show geometrically stable hip however patient has had difficulty with posterior precautions as dislocated 4 times most recently in a hinged abduction brace when he bent over to tie his shoes and internally rotated his hip. The patient's BMI is in the low 40s, he has lower extremity lymphedema and the brace has difficulty controlling his limb motion. Because of recurrent dislocations while wearing a brace he is taken for revision to a constrained total hip system using a [employer] constrained system. Risks and benefits of surgery were discussed again today and have been discussed in the office after his first 2 dislocations. Comorbidities include chronic atrial fibrillation history of alcohol abuse lower extremity edema dilated cardiomyopathy and a history of noncompliance with medications.  Past Medical History  Diagnosis Date  . Obesity   . Arthritis   . Hypertension   . Chronic systolic CHF (congestive heart failure), NYHA class 2 ([place])   . DCM (dilated cardiomyopathy) ([place])     EF 30% but now improved to 40-45% by echo 2016 on medical therapy with moderate MR  . Pancytopenia ([place])   . ETOH abuse   . Alcoholic liver disease ([place])   . Gastroduodenitis     H Pylori positive  . Chronic atrial fibrillation ([name])     no an anticoagulation canditate due to alcohol abuse, liver cirrhosis with pancytopenia, medical noncompliance  . Diverticulosis of colon   . Portal hypertensive gastropathy   . Personal history of colonic polyps - adenomas 08/21/2013  . Helicobacter pylori gastritis 10/19/2013  . Edema extremities  07/02/2015    Past Surgical History  Procedure Laterality Date  . Esophagogastroduodenoscopy N/A 08/21/2013    Procedure: ESOPHAGOGASTRODUODENOSCOPY (EGD);  Surgeon: Jerene Bears, MD;  Location: [facility] ENDOSCOPY;  Service: Endoscopy;  Laterality: N/A;  . Colonoscopy N/A 08/21/2013    Procedure: COLONOSCOPY;  Surgeon: Jerene Bears, MD;  Location: [facility] ENDOSCOPY;  Service: Endoscopy;  Laterality: N/A;  . Total hip arthroplasty Right 05/20/2015    Procedure: TOTAL HIP ARTHROPLASTY;  Surgeon: Frederik Pear, MD;  Location: [place];  Service: Orthopedics;  Laterality: Right;  . Hip closed reduction Right 08/13/2015    Procedure: CLOSED REDUCTION HIP;  Surgeon: [name], MD;  Location: [place] NEURO ORS;  Service: Orthopedics;  Laterality: Right;    Family History  Problem Relation Age of Onset  . Hypertension Father     Social History:  reports that he has never smoked. He has never used smokeless tobacco. He reports that he drinks about 4.8 oz of alcohol per week. He reports that he uses illicit drugs (Marijuana).  Allergies: No Known Allergies  Medications: I have reviewed the patient's current medications.  Results for orders placed or performed during the hospital encounter of 09/07/15 (from the past 48 hour(s))  CBC with Differential     Status: Abnormal   Collection Time: 09/07/15  6:55 PM  Result Value Ref Range   WBC 5.5 4.0 - 10.5 K/uL   RBC 3.27 (L) 4.22 - 5.81 MIL/uL   Hemoglobin 10.4 (L) 13.0 - 17.0 g/dL   HCT 32.2 (L) 39.0 - 52.0 %   MCV 98.5 78.0 - 100.0 fL   MCH 31.8  26.0 - 34.0 pg   MCHC 32.3 30.0 - 36.0 g/dL   RDW 16.0 (H) 11.5 - 15.5 %   Platelets 115 (L) 150 - 400 K/uL    Comment: REPEATED TO VERIFY PLATELET COUNT CONFIRMED BY SMEAR    Neutrophils Relative % 59 %   Neutro Abs 3.3 1.7 - 7.7 K/uL   Lymphocytes Relative 24 %   Lymphs Abs 1.3 0.7 - 4.0 K/uL   Monocytes Relative 14 %   Monocytes Absolute 0.8 0.1 - 1.0 K/uL   Eosinophils Relative 2 %   Eosinophils Absolute 0.1  0.0 - 0.7 K/uL   Basophils Relative 1 %   Basophils Absolute 0.0 0.0 - 0.1 K/uL  Basic metabolic panel     Status: Abnormal   Collection Time: 09/07/15  6:55 PM  Result Value Ref Range   Sodium 137 135 - 145 mmol/L   Potassium 4.0 3.5 - 5.1 mmol/L   Chloride 99 (L) 101 - 111 mmol/L   CO2 26 22 - 32 mmol/L   Glucose, Bld 98 65 - 99 mg/dL   BUN 14 6 - 20 mg/dL   Creatinine, Ser 1.09 0.61 - 1.24 mg/dL   Calcium 8.9 8.9 - 10.3 mg/dL   GFR calc non Af Amer >60 >60 mL/min   GFR calc Af Amer >60 >60 mL/min    Comment: (NOTE) The eGFR has been calculated using the CKD EPI equation. This calculation has not been validated in all clinical situations. eGFR's persistently <60 mL/min signify possible Chronic Kidney Disease.    Anion gap 12 5 - 15  Protime-INR     Status: Abnormal   Collection Time: 09/07/15  6:55 PM  Result Value Ref Range   Prothrombin Time 15.6 (H) 11.6 - 15.2 seconds   INR 1.22 0.00 - 1.49  Type and screen     Status: None   Collection Time: 09/07/15  6:55 PM  Result Value Ref Range   ABO/RH(D) A POS    Antibody Screen NEG    Sample Expiration 09/10/2015   Hepatic function panel     Status: Abnormal   Collection Time: 09/08/15  9:19 AM  Result Value Ref Range   Total Protein 6.6 6.5 - 8.1 g/dL   Albumin 3.3 (L) 3.5 - 5.0 g/dL   AST 24 15 - 41 U/L   ALT 11 (L) 17 - 63 U/L   Alkaline Phosphatase 67 38 - 126 U/L   Total Bilirubin 1.6 (H) 0.3 - 1.2 mg/dL   Bilirubin, Direct 0.3 0.1 - 0.5 mg/dL   Indirect Bilirubin 1.3 (H) 0.3 - 0.9 mg/dL  Brain natriuretic peptide     Status: None   Collection Time: 09/08/15  9:19 AM  Result Value Ref Range   B Natriuretic Peptide 89.9 0.0 - 100.0 pg/mL  Urinalysis, Routine w reflex microscopic (not at [facility])     Status: Abnormal   Collection Time: 09/08/15 11:44 AM  Result Value Ref Range   Color, Urine YELLOW YELLOW   APPearance CLEAR CLEAR   Specific Gravity, Urine 1.020 1.005 - 1.030   pH 8.5 (H) 5.0 - 8.0   Glucose, UA  NEGATIVE NEGATIVE mg/dL   Hgb urine dipstick NEGATIVE NEGATIVE   Bilirubin Urine NEGATIVE NEGATIVE   Ketones, ur NEGATIVE NEGATIVE mg/dL   Protein, ur NEGATIVE NEGATIVE mg/dL   Nitrite NEGATIVE NEGATIVE   Leukocytes, UA NEGATIVE NEGATIVE    Comment: MICROSCOPIC NOT DONE ON URINES WITH NEGATIVE PROTEIN, BLOOD, LEUKOCYTES, NITRITE, OR GLUCOSE <1000 mg/dL.  Surgical pcr screen     Status: None   Collection Time: 09/08/15  2:05 PM  Result Value Ref Range   MRSA, PCR NEGATIVE NEGATIVE   Staphylococcus aureus NEGATIVE NEGATIVE    Comment:        The Xpert SA Assay (FDA approved for NASAL specimens in patients over 70 years of age), is one component of a comprehensive surveillance program.  Test performance has been validated by [facility] for patients greater than or equal to 56 year old. It is not intended to diagnose infection nor to guide or monitor treatment.   Basic metabolic panel     Status: Abnormal   Collection Time: 09/09/15  2:39 AM  Result Value Ref Range   Sodium 137 135 - 145 mmol/L   Potassium 3.9 3.5 - 5.1 mmol/L   Chloride 103 101 - 111 mmol/L   CO2 28 22 - 32 mmol/L   Glucose, Bld 108 (H) 65 - 99 mg/dL   BUN 10 6 - 20 mg/dL   Creatinine, Ser 0.97 0.61 - 1.24 mg/dL   Calcium 8.7 (L) 8.9 - 10.3 mg/dL   GFR calc non Af Amer >60 >60 mL/min   GFR calc Af Amer >60 >60 mL/min    Comment: (NOTE) The eGFR has been calculated using the CKD EPI equation. This calculation has not been validated in all clinical situations. eGFR's persistently <60 mL/min signify possible Chronic Kidney Disease.    Anion gap 6 5 - 15  CBC     Status: Abnormal   Collection Time: 09/09/15  2:39 AM  Result Value Ref Range   WBC 6.6 4.0 - 10.5 K/uL   RBC 2.77 (L) 4.22 - 5.81 MIL/uL   Hemoglobin 8.8 (L) 13.0 - 17.0 g/dL   HCT 26.8 (L) 39.0 - 52.0 %   MCV 96.8 78.0 - 100.0 fL   MCH 31.8 26.0 - 34.0 pg   MCHC 32.8 30.0 - 36.0 g/dL   RDW 15.5 11.5 - 15.5 %   Platelets 93 (L) 150 - 400  K/uL    Comment: SPECIMEN CHECKED FOR CLOTS REPEATED TO VERIFY CONSISTENT WITH PREVIOUS RESULT     Dg Chest Port 1 View  09/08/2015  CLINICAL DATA:  Heart failure. History of hypertension, CHF and atrial fibrillation. EXAM: PORTABLE CHEST 1 VIEW COMPARISON:  05/09/2015; 02/25/2015 FINDINGS: Grossly unchanged enlarged cardiac silhouette and mediastinal contours with slight differences likely attributable to AP projection. There is persistent mild elevation/eventration involving the lateral aspect of the left hemidiaphragm. No focal airspace opacities. No pleural effusion or pneumothorax. No evidence of edema. No acute osseous abnormalities. IMPRESSION: Cardiomegaly without acute cardiopulmonary disease. Electronically Signed   By: Sandi Mariscal M.D.   On: 09/08/2015 09:56   Dg Hip Unilat With Pelvis 1v Right  09/08/2015  CLINICAL DATA:  Acute onset of right hip pain.  Initial encounter. EXAM: DG HIP (WITH OR WITHOUT PELVIS) 1V RIGHT COMPARISON:  Right hip radiographs performed 09/07/2015 FINDINGS: There is superior dislocation of the right hip prosthesis. No fracture is seen. There is chronic superior displacement of the right hip prosthesis, likely degenerative in nature. There is mild axial joint space narrowing at the left hip. Degenerative change is noted about the pubic symphysis. Degenerative change is seen along the lower lumbar spine. Scattered phleboliths are seen. IMPRESSION: Superior dislocation of the right hip prosthesis. Electronically Signed   By: Garald Balding M.D.   On: 09/08/2015 02:34   Dg Hip Unilat With Pelvis 1v Right  09/07/2015  CLINICAL DATA:  Status post hip reduction EXAM: DG HIP (WITH OR WITHOUT PELVIS) 1V RIGHT COMPARISON:  Sep 07, 2015 FINDINGS: Two images were obtained. On the first, the hip was still dislocated. On the second, the head of the femoral hardware appeared to be located within the acetabular component. No other acute abnormalities. IMPRESSION: Right hip re-  location. Electronically Signed   By: Dorise Bullion III M.D   On: 09/07/2015 21:22   Dg Hip Port Unilat With Pelvis 1v Right  09/08/2015  CLINICAL DATA:  Post reduction EXAM: DG HIP (WITH OR WITHOUT PELVIS) 1V PORT RIGHT COMPARISON:  09/08/2015 at 0149 hours FINDINGS: Interval reduction of the right hip arthroplasty. No definite fracture is seen.  Heterotopic calcification is present. IMPRESSION: Interval reduction of the right hip arthroplasty. Electronically Signed   By: Julian Hy M.D.   On: 09/08/2015 07:09   Dg Hip Unilat With Pelvis 2-3 Views Right  09/07/2015  CLINICAL DATA:  Heard pop at right hip while bending over. Concern for right hip prosthesis dislocation. Initial encounter. EXAM: DG HIP (WITH OR WITHOUT PELVIS) 2-3V RIGHT COMPARISON:  Right hip radiographs performed 08/12/2015 FINDINGS: There is recurrent superior dislocation of the patient's right hip prosthesis. No fracture is seen. Surrounding heterotopic bone formation is again noted. Mild axial joint space narrowing is noted at the left hip. Degenerative change is seen along the lumbar spine. Scattered phleboliths are seen. IMPRESSION: Recurrent superior dislocation of the right hip prosthesis. No evidence of fracture. Mild axial joint space narrowing at the left hip. Electronically Signed   By: Garald Balding M.D.   On: 09/07/2015 19:56    ROS today the patient denies any chest pains or shortness of breath denies any problems with the wound fevers or chills. Blood pressure 140/75, pulse 89, temperature 98.4 F (36.9 C), temperature source Oral, resp. rate 16, SpO2 100 %. Physical Exam: Surgical wound is well-healed no edema or erythema internal next rotation supine or 40 each foot tap is negative.  Assessment/Plan: Assessment: Geometrically stable right total hip and a [age]-year-old patient with very loose connective tissue, morbid obesity, and difficulty complying with hip precautions. 4 dislocations of the last 2  wearing his hinge abduction brace  Plan: Revision to a constrained right total hip is indicated, was discussed with the patient including all risks and benefits. We will get this accomplished later today.  Thomas [name] 09/09/2015, 7:21 AM

## 2015-09-09 NOTE — Anesthesia Preprocedure Evaluation (Addendum)
Anesthesia Evaluation  Patient identified by MRN, date of birth, ID band Patient awake    Reviewed: Allergy & Precautions, H&P , Patient's Chart, lab work & pertinent test results, reviewed documented beta blocker date and time   Airway Mallampati: III  TM Distance: >3 FB Neck ROM: full    Dental no notable dental hx.    Pulmonary    Pulmonary exam normal breath sounds clear to auscultation       Cardiovascular hypertension, On Medications +CHF  Atrial Fibrillation  Rhythm:regular Rate:Normal     Neuro/Psych    GI/Hepatic   Endo/Other  Morbid obesity  Renal/GU      Musculoskeletal   Abdominal   Peds  Hematology  (+) anemia ,   Anesthesia Other Findings   Reproductive/Obstetrics                           Anesthesia Physical Anesthesia Plan  ASA: III  Anesthesia Plan: General   Post-op Pain Management:    Induction: Intravenous  Airway Management Planned: LMA  Additional Equipment:   Intra-op Plan:   Post-operative Plan: Extubation in OR  Informed Consent: I have reviewed the patients History and Physical, chart, labs and discussed the procedure including the risks, benefits and alternatives for the proposed anesthesia with the patient or authorized representative who has indicated his/her understanding and acceptance.   Dental Advisory Given and Dental advisory given  Plan Discussed with: CRNA and Surgeon  Anesthesia Plan Comments: (  Discussed general anesthesia, including possible nausea, instrumentation of airway, sore throat,pulmonary aspiration, etc. I asked if the were any outstanding questions, or  concerns before we proceeded. )       Anesthesia Quick Evaluation

## 2015-09-09 NOTE — Progress Notes (Signed)
PROGRESS NOTE    Thomas [name]  [id number] DOB: [birth year] DOA: 09/07/2015 PCP: Hoyt Koch, MD   Outpatient Specialists:     Brief Narrative:  Thomas [name] is a 65 y.o. male with medical history significant for but not necessarily limited to, ETOH abuse, AFib, chronic systolic heart failure (EF November was 40-45%) / dilated cardiomyopathy (ETOH) Patient has osteoarthritis of the right hip and is status post total hip arthroplasty in January of this year. He had dislocation of the prosthesis late April which ECP was able to reduce. Prosthesis dislocated againt again a few days later and patient was admitted for closed reduction of the right total hip dislocation. Patient brought in last evening by EMS for recurrent right hip dislocation. Patient felt hip pop when he bent over to tie shoe. He subsequently developed right hip pain every time he moved his hip   Assessment & Plan:   Active Problems:   Chronic atrial fibrillation ([name])   ETOH abuse   Chronic systolic CHF (congestive heart failure), NYHA class 2 ([name])   Hypertension   Edema extremities   Recurrent dislocation of hip joint prosthesis ([name])   Hip dislocation, right ([name])   Status post revision of total hip replacement   Right hip dislocation, recurrent. Hip reduced in ED but dislocated again prior to ED discharge -Revision to a constrained right total hip   Chronic atrial fibrillation: Not on anti-coag secondary to ETOH abuse and history of thrombocytopenia.  - Continue Coreg -Home med list reads ASA 324 BID but he is only taking once daily. Will hold daily ASA since going to OR tomorrow. Will need to be resumed when okay with Ortho  -monitor on tele  Systolic CHF: last EF [id number] in 02/2015. BLE pitting edema. EF 40-45% on last echo Nov 2016 - Cardiologist made recent short term increase in lasix, no improvement in pitting edema. Now back on usual dose of lasix 40mg  dose.  -Daily weights, I&O   -continue Coreg -BNP ok  ETOH abuse.  -CIWA  Thrombocytopenia, chronic. Count stable at 115. Bone marrow suppression? Underlying cirrhosis CTA of chest in 2015 suggested steatosis and / or cirrhosis.   HTN. BP on low side in ED.  -low dose medications   DVT prophylaxis:  SCD's  Code Status: Full Code   Family Communication: No family at bedside  Disposition Plan:  PT eval-- home in AM?   Consultants:   ortho  Procedures:        Subjective: No CP Hopes to go home in the AM  Objective: Filed Vitals:   09/09/15 1400 09/09/15 1415 09/09/15 1430 09/09/15 1445  BP: 113/94 116/77  117/66  Pulse: 62 75 77 83  Temp:  97.8 F (36.6 C)  98.2 F (36.8 C)  TempSrc:    Oral  Resp: 12 23 13 18   SpO2: 92% 99% 100% 100%    Intake/Output Summary (Last 24 hours) at 09/09/15 1551 Last data filed at 09/09/15 1326  Gross per 24 hour  Intake   1670 ml  Output   1150 ml  Net    520 ml   There were no vitals filed for this visit.  Examination:  General exam: Appears calm and comfortable  Respiratory: clear to auscultation bilaterally, no wheezing, no crackles. Normal respiratory effort. No accessory muscle use.  Cardiovascular: AFib on monitor, rate upper 90's. Bilateral lower extremity edema, 2+ pedal pulses. Abdomen: no tenderness, no masses palpated. No hepatomegaly. Bowel sounds positive.    Data  Reviewed: I have personally reviewed following labs and imaging studies  CBC:  Recent Labs Lab 09/07/15 1855 09/09/15 0239  WBC 5.5 6.6  NEUTROABS 3.3  --   HGB 10.4* 8.8*  HCT 32.2* 26.8*  MCV 98.5 96.8  PLT 115* 93*   Basic Metabolic Panel:  Recent Labs Lab 09/07/15 1855 09/09/15 0239  NA 137 137  K 4.0 3.9  CL 99* 103  CO2 26 28  GLUCOSE 98 108*  BUN 14 10  CREATININE 1.09 0.97  CALCIUM 8.9 8.7*   GFR: CrCl cannot be calculated (Unknown ideal weight.). Liver Function Tests:  Recent Labs Lab 09/08/15 0919  AST 24  ALT 11*   ALKPHOS 67  BILITOT 1.6*  PROT 6.6  ALBUMIN 3.3*   No results for input(s): LIPASE, AMYLASE in the last 168 hours. No results for input(s): AMMONIA in the last 168 hours. Coagulation Profile:  Recent Labs Lab 09/07/15 1855  INR 1.22   Cardiac Enzymes: No results for input(s): CKTOTAL, CKMB, CKMBINDEX, TROPONINI in the last 168 hours. BNP (last 3 results) No results for input(s): PROBNP in the last 8760 hours. HbA1C: No results for input(s): HGBA1C in the last 72 hours. CBG: No results for input(s): GLUCAP in the last 168 hours. Lipid Profile: No results for input(s): CHOL, HDL, LDLCALC, TRIG, CHOLHDL, LDLDIRECT in the last 72 hours. Thyroid Function Tests: No results for input(s): TSH, T4TOTAL, FREET4, T3FREE, THYROIDAB in the last 72 hours. Anemia Panel: No results for input(s): VITAMINB12, FOLATE, FERRITIN, TIBC, IRON, RETICCTPCT in the last 72 hours. Urine analysis:    Component Value Date/Time   COLORURINE YELLOW 09/08/2015 [place] 09/08/2015 1144   LABSPEC 1.020 09/08/2015 1144   PHURINE 8.5* 09/08/2015 1144   GLUCOSEU NEGATIVE 09/08/2015 1144   HGBUR NEGATIVE 09/08/2015 1144   [place] 09/08/2015 1144   KETONESUR NEGATIVE 09/08/2015 1144   PROTEINUR NEGATIVE 09/08/2015 1144   NITRITE NEGATIVE 09/08/2015 1144   LEUKOCYTESUR NEGATIVE 09/08/2015 1144    Recent Results (from the past 240 hour(s))  Surgical pcr screen     Status: None   Collection Time: 09/08/15  2:05 PM  Result Value Ref Range Status   MRSA, PCR NEGATIVE NEGATIVE Final   Staphylococcus aureus NEGATIVE NEGATIVE Final    Comment:        The Xpert SA Assay (FDA approved for NASAL specimens in patients over 41 years of age), is one component of a comprehensive surveillance program.  Test performance has been validated by [facility] for patients greater than or equal to 52 year old. It is not intended to diagnose infection nor to guide or monitor treatment.        Anti-infectives    Start     Dose/Rate Route Frequency Ordered Stop   09/09/15 1200  ceFAZolin (ANCEF) 3 g in dextrose 5 % 50 mL IVPB     3 g 130 mL/hr over 30 Minutes Intravenous To ShortStay Surgical 09/08/15 1343 09/09/15 1127       Radiology Studies: Dg Chest Port 1 View  09/08/2015  CLINICAL DATA:  Heart failure. History of hypertension, CHF and atrial fibrillation. EXAM: PORTABLE CHEST 1 VIEW COMPARISON:  05/09/2015; 02/25/2015 FINDINGS: Grossly unchanged enlarged cardiac silhouette and mediastinal contours with slight differences likely attributable to AP projection. There is persistent mild elevation/eventration involving the lateral aspect of the left hemidiaphragm. No focal airspace opacities. No pleural effusion or pneumothorax. No evidence of edema. No acute osseous abnormalities. IMPRESSION: Cardiomegaly without acute  cardiopulmonary disease. Electronically Signed   By: Sandi Mariscal M.D.   On: 09/08/2015 09:56   Dg Hip Unilat With Pelvis 1v Right  09/08/2015  CLINICAL DATA:  Acute onset of right hip pain.  Initial encounter. EXAM: DG HIP (WITH OR WITHOUT PELVIS) 1V RIGHT COMPARISON:  Right hip radiographs performed 09/07/2015 FINDINGS: There is superior dislocation of the right hip prosthesis. No fracture is seen. There is chronic superior displacement of the right hip prosthesis, likely degenerative in nature. There is mild axial joint space narrowing at the left hip. Degenerative change is noted about the pubic symphysis. Degenerative change is seen along the lower lumbar spine. Scattered phleboliths are seen. IMPRESSION: Superior dislocation of the right hip prosthesis. Electronically Signed   By: Garald Balding M.D.   On: 09/08/2015 02:34   Dg Hip Unilat With Pelvis 1v Right  09/07/2015  CLINICAL DATA:  Status post hip reduction EXAM: DG HIP (WITH OR WITHOUT PELVIS) 1V RIGHT COMPARISON:  Sep 07, 2015 FINDINGS: Two images were obtained. On the first, the hip was still  dislocated. On the second, the head of the femoral hardware appeared to be located within the acetabular component. No other acute abnormalities. IMPRESSION: Right hip re- location. Electronically Signed   By: Dorise Bullion III M.D   On: 09/07/2015 21:22   Dg Hip Port Unilat With Pelvis 1v Right  09/08/2015  CLINICAL DATA:  Post reduction EXAM: DG HIP (WITH OR WITHOUT PELVIS) 1V PORT RIGHT COMPARISON:  09/08/2015 at 0149 hours FINDINGS: Interval reduction of the right hip arthroplasty. No definite fracture is seen.  Heterotopic calcification is present. IMPRESSION: Interval reduction of the right hip arthroplasty. Electronically Signed   By: Julian Hy M.D.   On: 09/08/2015 07:09   Dg Hip Unilat With Pelvis 2-3 Views Right  09/07/2015  CLINICAL DATA:  Heard pop at right hip while bending over. Concern for right hip prosthesis dislocation. Initial encounter. EXAM: DG HIP (WITH OR WITHOUT PELVIS) 2-3V RIGHT COMPARISON:  Right hip radiographs performed 08/12/2015 FINDINGS: There is recurrent superior dislocation of the patient's right hip prosthesis. No fracture is seen. Surrounding heterotopic bone formation is again noted. Mild axial joint space narrowing is noted at the left hip. Degenerative change is seen along the lumbar spine. Scattered phleboliths are seen. IMPRESSION: Recurrent superior dislocation of the right hip prosthesis. No evidence of fracture. Mild axial joint space narrowing at the left hip. Electronically Signed   By: Garald Balding M.D.   On: 09/07/2015 19:56        Scheduled Meds: . sodium chloride   Intravenous Once  . [START ON 09/10/2015] aspirin EC  325 mg Oral Q breakfast  . carvedilol  12.5 mg Oral BID WC  . [START ON 09/10/2015] dexamethasone  10 mg Intravenous Once  . docusate sodium  100 mg Oral BID  . folic acid  1 mg Oral Daily  . losartan  12.5 mg Oral Daily  . propofol  100 mg Intravenous Once  . sodium chloride flush  3 mL Intravenous Q12H  . thiamine   100 mg Oral Daily   Continuous Infusions: . dextrose 5 % and 0.45 % NaCl with KCl 20 mEq/L    . lactated ringers 10 mL/hr at 09/09/15 1031     LOS: 0 days    Time spent: 25 min    [place], DO [facility] Pager [phone]  If 7PM-7AM, please contact night-coverage [email or web address] Password [facility] 09/09/2015, 3:51 PM

## 2015-09-09 NOTE — Progress Notes (Signed)
Pt transported off unit to OR for procedure. Report called off to short stay RN. P. Amo Catheryn Slifer RN °

## 2015-09-09 NOTE — Transfer of Care (Signed)
Immediate Anesthesia Transfer of Care Note  Patient: Thomas [name]  Procedure(s) Performed: Procedure(s): TOTAL HIP REVISION (Right)  Patient Location: PACU  Anesthesia Type:General  Level of Consciousness: awake, alert  and oriented  Airway & Oxygen Therapy: Patient Spontanous Breathing  Post-op Assessment: Report given to RN and Post -op Vital signs reviewed and stable  Post vital signs: Reviewed and stable  Last Vitals:  Filed Vitals:   09/09/15 0655 09/09/15 0955  BP: 140/75 127/80  Pulse: 89 84  Temp: 36.9 C 36.9 C  Resp: 16 18    Last Pain:  Filed Vitals:   09/09/15 0955  PainSc: 0-No pain         Complications: No apparent anesthesia complications

## 2015-09-09 NOTE — Anesthesia Procedure Notes (Signed)
Procedure Name: LMA Insertion Date/Time: 09/09/2015 11:26 AM Performed by: Manuela Schwartz B Pre-anesthesia Checklist: Patient identified, Emergency Drugs available, Suction available, Patient being monitored and Timeout performed Patient Re-evaluated:Patient Re-evaluated prior to inductionOxygen Delivery Method: Circle system utilized Preoxygenation: Pre-oxygenation with 100% oxygen Intubation Type: IV induction LMA: LMA inserted LMA Size: 5.0 Number of attempts: 1 Placement Confirmation: positive ETCO2 and breath sounds checked- equal and bilateral Tube secured with: Tape Dental Injury: Teeth and Oropharynx as per pre-operative assessment

## 2015-09-09 NOTE — H&P (View-Only) (Signed)
Reason for Consult: Current dislocations of geometrically stable right total hip placed January 2017 Referring Physician: Dr. Rosemary Holms Thomas is an 65 y.o. [name].  HPI: 65 year old man underwent right total up arthroplasty posterior lateral approach on 05/20/2015. 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 Postoperative x-rays show geometrically stable hip however patient has had difficulty with posterior precautions as dislocated 4 times most recently in a hinged abduction brace when he bent over to tie his shoes and internally rotated his hip. The patient's BMI is in the low 40s, he has lower extremity lymphedema and the brace has difficulty controlling his limb motion. Because of recurrent dislocations while wearing a brace he is taken for revision to a constrained total hip system using a [employer] constrained system. Risks and benefits of surgery were discussed again today and have been discussed in the office after his first 2 dislocations. Comorbidities include chronic atrial fibrillation history of alcohol abuse lower extremity edema dilated cardiomyopathy and a history of noncompliance with medications.  Past Medical History  Diagnosis Date  . Obesity   . Arthritis   . Hypertension   . Chronic systolic CHF (congestive heart failure), NYHA class 2 ([place])   . DCM (dilated cardiomyopathy) ([place])     EF 30% but now improved to 40-45% by echo 2016 on medical therapy with moderate MR  . Pancytopenia ([place])   . ETOH abuse   . Alcoholic liver disease ([place])   . Gastroduodenitis     H Pylori positive  . Chronic atrial fibrillation ([name])     no an anticoagulation canditate due to alcohol abuse, liver cirrhosis with pancytopenia, medical noncompliance  . Diverticulosis of colon   . Portal hypertensive gastropathy   . Personal history of colonic polyps - adenomas 08/21/2013  . Helicobacter pylori gastritis 10/19/2013  . Edema extremities  07/02/2015    Past Surgical History  Procedure Laterality Date  . Esophagogastroduodenoscopy N/A 08/21/2013    Procedure: ESOPHAGOGASTRODUODENOSCOPY (EGD);  Surgeon: Jerene Bears, MD;  Location: [facility] ENDOSCOPY;  Service: Endoscopy;  Laterality: N/A;  . Colonoscopy N/A 08/21/2013    Procedure: COLONOSCOPY;  Surgeon: Jerene Bears, MD;  Location: [facility] ENDOSCOPY;  Service: Endoscopy;  Laterality: N/A;  . Total hip arthroplasty Right 05/20/2015    Procedure: TOTAL HIP ARTHROPLASTY;  Surgeon: Frederik Pear, MD;  Location: [facility];  Service: Orthopedics;  Laterality: Right;  . Hip closed reduction Right 08/13/2015    Procedure: CLOSED REDUCTION HIP;  Surgeon: [name], MD;  Location: [place] [facility];  Service: Orthopedics;  Laterality: Right;    Family History  Problem Relation Age of Onset  . Hypertension Father     Social History:  reports that he has never smoked. He has never used smokeless tobacco. He reports that he drinks about 4.8 oz of alcohol per week. He reports that he uses illicit drugs (Marijuana).  Allergies: No Known Allergies  Medications: I have reviewed the patient's current medications.  Results for orders placed or performed during the hospital encounter of 09/07/15 (from the past 48 hour(s))  CBC with Differential     Status: Abnormal   Collection Time: 09/07/15  6:55 PM  Result Value Ref Range   WBC 5.5 4.0 - 10.5 K/uL   RBC 3.27 (L) 4.22 - 5.81 MIL/uL   Hemoglobin 10.4 (L) 13.0 - 17.0 g/dL   HCT 32.2 (L) 39.0 - 52.0 %   MCV 98.5 78.0 - 100.0 fL   MCH 31.8  26.0 - 34.0 pg   MCHC 32.3 30.0 - 36.0 g/dL   RDW 16.0 (H) 11.5 - 15.5 %   Platelets 115 (L) 150 - 400 K/uL    Comment: REPEATED TO VERIFY PLATELET COUNT CONFIRMED BY SMEAR    Neutrophils Relative % 59 %   Neutro Abs 3.3 1.7 - 7.7 K/uL   Lymphocytes Relative 24 %   Lymphs Abs 1.3 0.7 - 4.0 K/uL   Monocytes Relative 14 %   Monocytes Absolute 0.8 0.1 - 1.0 K/uL   Eosinophils Relative 2 %   Eosinophils Absolute 0.1  0.0 - 0.7 K/uL   Basophils Relative 1 %   Basophils Absolute 0.0 0.0 - 0.1 K/uL  Basic metabolic panel     Status: Abnormal   Collection Time: 09/07/15  6:55 PM  Result Value Ref Range   Sodium 137 135 - 145 mmol/L   Potassium 4.0 3.5 - 5.1 mmol/L   Chloride 99 (L) 101 - 111 mmol/L   CO2 26 22 - 32 mmol/L   Glucose, Bld 98 65 - 99 mg/dL   BUN 14 6 - 20 mg/dL   Creatinine, Ser 1.09 0.61 - 1.24 mg/dL   Calcium 8.9 8.9 - 10.3 mg/dL   GFR calc non Af Amer >60 >60 mL/min   GFR calc Af Amer >60 >60 mL/min    Comment: (NOTE) The eGFR has been calculated using the CKD EPI equation. This calculation has not been validated in all clinical situations. eGFR's persistently <60 mL/min signify possible Chronic Kidney Disease.    Anion gap 12 5 - 15  Protime-INR     Status: Abnormal   Collection Time: 09/07/15  6:55 PM  Result Value Ref Range   Prothrombin Time 15.6 (H) 11.6 - 15.2 seconds   INR 1.22 0.00 - 1.49  Type and screen     Status: None   Collection Time: 09/07/15  6:55 PM  Result Value Ref Range   ABO/RH(D) A POS    Antibody Screen NEG    Sample Expiration 09/10/2015   Hepatic function panel     Status: Abnormal   Collection Time: 09/08/15  9:19 AM  Result Value Ref Range   Total Protein 6.6 6.5 - 8.1 g/dL   Albumin 3.3 (L) 3.5 - 5.0 g/dL   AST 24 15 - 41 U/L   ALT 11 (L) 17 - 63 U/L   Alkaline Phosphatase 67 38 - 126 U/L   Total Bilirubin 1.6 (H) 0.3 - 1.2 mg/dL   Bilirubin, Direct 0.3 0.1 - 0.5 mg/dL   Indirect Bilirubin 1.3 (H) 0.3 - 0.9 mg/dL  Brain natriuretic peptide     Status: None   Collection Time: 09/08/15  9:19 AM  Result Value Ref Range   B Natriuretic Peptide 89.9 0.0 - 100.0 pg/mL  Urinalysis, Routine w reflex microscopic (not at [facility])     Status: Abnormal   Collection Time: 09/08/15 11:44 AM  Result Value Ref Range   Color, Urine YELLOW YELLOW   APPearance CLEAR CLEAR   Specific Gravity, Urine 1.020 1.005 - 1.030   pH 8.5 (H) 5.0 - 8.0   Glucose, UA  NEGATIVE NEGATIVE mg/dL   Hgb urine dipstick NEGATIVE NEGATIVE   Bilirubin Urine NEGATIVE NEGATIVE   Ketones, ur NEGATIVE NEGATIVE mg/dL   Protein, ur NEGATIVE NEGATIVE mg/dL   Nitrite NEGATIVE NEGATIVE   Leukocytes, UA NEGATIVE NEGATIVE    Comment: MICROSCOPIC NOT DONE ON URINES WITH NEGATIVE PROTEIN, BLOOD, LEUKOCYTES, NITRITE, OR GLUCOSE <1000 mg/dL.  Surgical pcr screen     Status: None   Collection Time: 09/08/15  2:05 PM  Result Value Ref Range   MRSA, PCR NEGATIVE NEGATIVE   Staphylococcus aureus NEGATIVE NEGATIVE    Comment:        The Xpert SA Assay (FDA approved for NASAL specimens in patients over 17 years of age), is one component of a comprehensive surveillance program.  Test performance has been validated by [facility] for patients greater than or equal to 30 year old. It is not intended to diagnose infection nor to guide or monitor treatment.   Basic metabolic panel     Status: Abnormal   Collection Time: 09/09/15  2:39 AM  Result Value Ref Range   Sodium 137 135 - 145 mmol/L   Potassium 3.9 3.5 - 5.1 mmol/L   Chloride 103 101 - 111 mmol/L   CO2 28 22 - 32 mmol/L   Glucose, Bld 108 (H) 65 - 99 mg/dL   BUN 10 6 - 20 mg/dL   Creatinine, Ser 0.97 0.61 - 1.24 mg/dL   Calcium 8.7 (L) 8.9 - 10.3 mg/dL   GFR calc non Af Amer >60 >60 mL/min   GFR calc Af Amer >60 >60 mL/min    Comment: (NOTE) The eGFR has been calculated using the CKD EPI equation. This calculation has not been validated in all clinical situations. eGFR's persistently <60 mL/min signify possible Chronic Kidney Disease.    Anion gap 6 5 - 15  CBC     Status: Abnormal   Collection Time: 09/09/15  2:39 AM  Result Value Ref Range   WBC 6.6 4.0 - 10.5 K/uL   RBC 2.77 (L) 4.22 - 5.81 MIL/uL   Hemoglobin 8.8 (L) 13.0 - 17.0 g/dL   HCT 26.8 (L) 39.0 - 52.0 %   MCV 96.8 78.0 - 100.0 fL   MCH 31.8 26.0 - 34.0 pg   MCHC 32.8 30.0 - 36.0 g/dL   RDW 15.5 11.5 - 15.5 %   Platelets 93 (L) 150 - 400  K/uL    Comment: SPECIMEN CHECKED FOR CLOTS REPEATED TO VERIFY CONSISTENT WITH PREVIOUS RESULT     Dg Chest Port 1 View  09/08/2015  CLINICAL DATA:  Heart failure. History of hypertension, CHF and atrial fibrillation. EXAM: PORTABLE CHEST 1 VIEW COMPARISON:  05/09/2015; 02/25/2015 FINDINGS: Grossly unchanged enlarged cardiac silhouette and mediastinal contours with slight differences likely attributable to AP projection. There is persistent mild elevation/eventration involving the lateral aspect of the left hemidiaphragm. No focal airspace opacities. No pleural effusion or pneumothorax. No evidence of edema. No acute osseous abnormalities. IMPRESSION: Cardiomegaly without acute cardiopulmonary disease. Electronically Signed   By: Sandi Mariscal M.D.   On: 09/08/2015 09:56   Dg Hip Unilat With Pelvis 1v Right  09/08/2015  CLINICAL DATA:  Acute onset of right hip pain.  Initial encounter. EXAM: DG HIP (WITH OR WITHOUT PELVIS) 1V RIGHT COMPARISON:  Right hip radiographs performed 09/07/2015 FINDINGS: There is superior dislocation of the right hip prosthesis. No fracture is seen. There is chronic superior displacement of the right hip prosthesis, likely degenerative in nature. There is mild axial joint space narrowing at the left hip. Degenerative change is noted about the pubic symphysis. Degenerative change is seen along the lower lumbar spine. Scattered phleboliths are seen. IMPRESSION: Superior dislocation of the right hip prosthesis. Electronically Signed   By: Garald Balding M.D.   On: 09/08/2015 02:34   Dg Hip Unilat With Pelvis 1v Right  09/07/2015  CLINICAL DATA:  Status post hip reduction EXAM: DG HIP (WITH OR WITHOUT PELVIS) 1V RIGHT COMPARISON:  Sep 07, 2015 FINDINGS: Two images were obtained. On the first, the hip was still dislocated. On the second, the head of the femoral hardware appeared to be located within the acetabular component. No other acute abnormalities. IMPRESSION: Right hip re-  location. Electronically Signed   By: Dorise Bullion III M.D   On: 09/07/2015 21:22   Dg Hip Port Unilat With Pelvis 1v Right  09/08/2015  CLINICAL DATA:  Post reduction EXAM: DG HIP (WITH OR WITHOUT PELVIS) 1V PORT RIGHT COMPARISON:  09/08/2015 at 0149 hours FINDINGS: Interval reduction of the right hip arthroplasty. No definite fracture is seen.  Heterotopic calcification is present. IMPRESSION: Interval reduction of the right hip arthroplasty. Electronically Signed   By: Julian Hy M.D.   On: 09/08/2015 07:09   Dg Hip Unilat With Pelvis 2-3 Views Right  09/07/2015  CLINICAL DATA:  Heard pop at right hip while bending over. Concern for right hip prosthesis dislocation. Initial encounter. EXAM: DG HIP (WITH OR WITHOUT PELVIS) 2-3V RIGHT COMPARISON:  Right hip radiographs performed 08/12/2015 FINDINGS: There is recurrent superior dislocation of the patient's right hip prosthesis. No fracture is seen. Surrounding heterotopic bone formation is again noted. Mild axial joint space narrowing is noted at the left hip. Degenerative change is seen along the lumbar spine. Scattered phleboliths are seen. IMPRESSION: Recurrent superior dislocation of the right hip prosthesis. No evidence of fracture. Mild axial joint space narrowing at the left hip. Electronically Signed   By: Garald Balding M.D.   On: 09/07/2015 19:56    ROS today the patient denies any chest pains or shortness of breath denies any problems with the wound fevers or chills. Blood pressure 140/75, pulse 89, temperature 98.4 F (36.9 C), temperature source Oral, resp. rate 16, SpO2 100 %. Physical Exam: Surgical wound is well-healed no edema or erythema internal next rotation supine or 40 each foot tap is negative.  Assessment/Plan: Assessment: Geometrically stable right total hip and a [age]-year-old patient with very loose connective tissue, morbid obesity, and difficulty complying with hip precautions. 4 dislocations of the last 2  wearing his hinge abduction brace  Plan: Revision to a constrained right total hip is indicated, was discussed with the patient including all risks and benefits. We will get this accomplished later today.  Thomas [name] 09/09/2015, 7:21 AM

## 2015-09-09 NOTE — Progress Notes (Signed)
Pt back to room from pacu; pt A&O x4; right hip incision dsg clean, dry and intact with ice applied to site. IV intact and transfusing; telemetry reapplied and CCMD notified; pt VSS; SCD on; LLE +3 edema; neuro wnl; pt in bed with call light within reach. Will continue to closely monitor. Delia Heady RN

## 2015-09-09 NOTE — Interval H&P Note (Signed)
History and Physical Interval Note:  09/09/2015 7:48 AM  Thomas [name]  has presented today for surgery, with the diagnosis of unstable right total hip   The various methods of treatment have been discussed with the patient and family. After consideration of risks, benefits and other options for treatment, the patient has consented to  Procedure(s): TOTAL HIP REVISION (Right) as a surgical intervention .  The patient's history has been reviewed, patient examined, no change in status, stable for surgery.  I have reviewed the patient's chart and labs.  Questions were answered to the patient's satisfaction.     Kerin Salen

## 2015-09-10 ENCOUNTER — Encounter (HOSPITAL_COMMUNITY): Payer: Self-pay | Admitting: Orthopedic Surgery

## 2015-09-10 DIAGNOSIS — Z9660 Presence of unspecified orthopedic joint implant: Secondary | ICD-10-CM

## 2015-09-10 LAB — CBC
HEMATOCRIT: 24.7 % — AB (ref 39.0–52.0)
HEMOGLOBIN: 8.1 g/dL — AB (ref 13.0–17.0)
MCH: 30.8 pg (ref 26.0–34.0)
MCHC: 32.8 g/dL (ref 30.0–36.0)
MCV: 93.9 fL (ref 78.0–100.0)
Platelets: 82 10*3/uL — ABNORMAL LOW (ref 150–400)
RBC: 2.63 MIL/uL — ABNORMAL LOW (ref 4.22–5.81)
RDW: 17 % — ABNORMAL HIGH (ref 11.5–15.5)
WBC: 9.2 10*3/uL (ref 4.0–10.5)

## 2015-09-10 LAB — BASIC METABOLIC PANEL
Anion gap: 6 (ref 5–15)
BUN: 10 mg/dL (ref 6–20)
CHLORIDE: 100 mmol/L — AB (ref 101–111)
CO2: 27 mmol/L (ref 22–32)
CREATININE: 0.91 mg/dL (ref 0.61–1.24)
Calcium: 8.2 mg/dL — ABNORMAL LOW (ref 8.9–10.3)
GFR calc non Af Amer: >60 mL/min (ref >60)
Glucose, Bld: 109 mg/dL — ABNORMAL HIGH (ref 65–99)
POTASSIUM: 3.9 mmol/L (ref 3.5–5.1)
Sodium: 133 mmol/L — ABNORMAL LOW (ref 135–145)

## 2015-09-10 MED ORDER — DOCUSATE SODIUM 100 MG PO CAPS: 100.0000 mg | ORAL_CAPSULE | Freq: Two times a day (BID) | ORAL | Status: DC

## 2015-09-10 MED ORDER — CARVEDILOL 12.5 MG PO TABS: 6.2500 mg | ORAL_TABLET | Freq: Two times a day (BID) | ORAL | Status: DC

## 2015-09-10 MED ORDER — FUROSEMIDE 40 MG PO TABS: 40.0000 mg | ORAL_TABLET | Freq: Two times a day (BID) | ORAL | Status: DC

## 2015-09-10 NOTE — Progress Notes (Addendum)
Patient ID: Thomas [name], male   DOB: [birth year], 65 y.o.   MRN: [id number] PATIENT ID: Thomas [name]  MRN: [id number]  DOB/AGE:  21-Oct-1950 / 65 y.o.  1 Day Post-Op Procedure(s) (LRB): TOTAL HIP REVISION (Right)    PROGRESS NOTE Subjective: Patient is alert, oriented, no Nausea, no Vomiting, yes passing gas, . Taking PO well. Denies SOB, Chest or Calf Pain. Using Incentive Spirometer, PAS in place. Ambulate patient was anxious to sit up which we accomplished this morning when I was present. Thomas [name] denies any dizziness denies any weakness and is actually anxious to go home. Thomas [name] stayed in the sitting position for approximately 10 minutes while I examined his wound, and reviewed his history of cardiomyopathy. Thomas [name] reported his pain level was minimal. Patient reports pain as  2/10  .    Objective: Vital signs in last 24 hours: Filed Vitals:   09/09/15 1445 09/09/15 2145 09/10/15 0200 09/10/15 0523  BP: 117/66 96/52 97/55  108/59  Pulse: 83 94 92 97  Temp: 98.2 F (36.8 C) 100 F (37.8 C) 99.9 F (37.7 C) 100.1 F (37.8 C)  TempSrc: Oral Oral Oral Oral  Resp: 18 18 17 19   SpO2: 100% 100% 100% 100%      Intake/Output from previous day: I/O last 3 completed shifts: In: 1950 [P.O.:280; I.V.:1000; Blood:670] Out: [id number] [Urine:1850; Blood:700]   Intake/Output this shift:     LABORATORY DATA:  Recent Labs  09/07/15 1855 09/09/15 0239 09/10/15 0252  WBC 5.5 6.6 9.2  HGB 10.4* 8.8* 8.1*  HCT 32.2* 26.8* 24.7*  PLT 115* 93* 82*  NA 137 137 133*  K 4.0 3.9 3.9  CL 99* 103 100*  CO2 26 28 27   BUN 14 10 10   CREATININE 1.09 0.97 0.91  GLUCOSE 98 108* 109*  INR 1.22  --   --   CALCIUM 8.9 8.7* 8.2*    Examination: Neurologically intact ABD soft Neurovascular intact Sensation intact distally Intact pulses distally Dorsiflexion/Plantar flexion intact Incision: dressing C/D/I No cellulitis present Compartment soft} XR AP&Lat of hip shows well placed\fixed THA  Assessment:    1 Day Post-Op Procedure(s) (LRB): TOTAL HIP REVISION (Right) ADDITIONAL DIAGNOSIS:  Expected Acute Blood Loss Anemia, Hypertension, history of alcohol abuse with alcohol liver cirrhosis, lower extremity edema chronic, morbid obesity, portal hypertension, atrial fibrillation chronic, pancytopenia.  Plan: PT/OT WBAT, THA, patient denies any weakness or dizziness when I sat him up this morning, we discussed another blood transfusion. Thomas [name] would like to hold off on this as long as Thomas [name] doesn't exhibit any symptoms and this is probably reasonable.  DVT Prophylaxis: SCDx72 hrs, ASA 325 mg BID x 2 weeks  DISCHARGE PLAN: Home, when Thomas [name] passes all physical therapy goals, Thomas [name] lives with his mother.  DISCHARGE NEEDS: HHPT, Walker and 3-in-1 comode seat

## 2015-09-10 NOTE — Discharge Summary (Signed)
Physician Discharge Summary  Thomas [name] [id number] DOB: 1950-07-07 DOA: 09/07/2015  PCP: Hoyt Koch, MD  Admit date: 09/07/2015 Discharge date: 09/10/2015   Recommendations for Outpatient Follow-Up:   1. Home health- PT/OT-- also added RN as patient has confusion about daily weights and medications 2. Incentive spirometry   Discharge Diagnosis:   Active Problems:   Chronic atrial fibrillation ([name])   ETOH abuse   Chronic systolic CHF (congestive heart failure), NYHA class 2 ([name])   Hypertension   Edema extremities   Recurrent dislocation of hip joint prosthesis ([name])   Hip dislocation, right ([name])   Status post revision of total hip replacement   Discharge disposition:  Home with supervision  Discharge Condition: Improved.  Diet recommendation: Low sodium, heart healthy  Wound care: None.   History of Present Illness:   Thomas [name] is a 65 y.o. male with medical history significant for but not necessarily limited to, ETOH abuse, AFib, chronic systolic heart failure (EF November was 40-45%) / dilated cardiomyopathy (ETOH) Patient has osteoarthritis of the right hip and is status post total hip arthroplasty in January of this year. He had dislocation of the prosthesis late April which ECP was able to reduce. Prosthesis dislocated againt again a few days later and patient was admitted for closed reduction of the right total hip dislocation. Patient brought in last evening by EMS for recurrent right hip dislocation. Patient felt hip pop when he bent over to tie shoe. He subsequently developed right hip pain every time he moved his hip   Hospital Course by Problem:   Right hip dislocation, recurrent. Hip reduced in ED but dislocated again prior to ED discharge -Revision to a right total hip   Chronic atrial fibrillation: Not on anti-coag secondary to ETOH abuse and history of thrombocytopenia.  - Continue Coreg at lower dose -ASA BID per  ortho  Systolic CHF: last EF [id number] in 02/2015. BLE pitting edema. EF 40-45% on last echo Nov 2016 - Cardiologist made recent short term increase in lasix, no improvement in pitting edema. Now back on usual dose of lasix 40mg  dose.  -Daily weights -decrease Coreg -BNP ok  ETOH abuse.  -CIWA  Thrombocytopenia, chronic. Count stable at 115. Bone marrow suppression? Underlying cirrhosis CTA of chest in 2015 suggested steatosis and / or cirrhosis.   HTN. BP on low side in ED.  -low dose medications    Medical Consultants:    ortho   Discharge Exam:   Filed Vitals:   09/10/15 1255 09/10/15 1312  BP: 112/60 115/66  Pulse: 83 86  Temp: 99.6 F (37.6 C) 99 F (37.2 C)  Resp: 18 18   Filed Vitals:   09/10/15 1006 09/10/15 1248 09/10/15 1255 09/10/15 1312  BP: 86/36  112/60 115/66  Pulse: 102  83 86  Temp:  91.4 F (33 C) 99.6 F (37.6 C) 99 F (37.2 C)  TempSrc:   Oral Oral  Resp:   18 18  SpO2:   100% 100%    Gen:  NAD Cardiovascular:  RRR, No M/R/G Respiratory: Lungs CTAB Gastrointestinal: Abdomen soft, NT/ND with normal active bowel sounds. Extremities: No C/E/C   The results of significant diagnostics from this hospitalization (including imaging, microbiology, ancillary and laboratory) are listed below for reference.     Procedures and Diagnostic Studies:   Dg Chest Port 1 View  10-02-15  CLINICAL DATA:  Heart failure. History of hypertension, CHF and atrial fibrillation. EXAM: PORTABLE CHEST 1 VIEW COMPARISON:  05/09/2015;  02/25/2015 FINDINGS: Grossly unchanged enlarged cardiac silhouette and mediastinal contours with slight differences likely attributable to AP projection. There is persistent mild elevation/eventration involving the lateral aspect of the left hemidiaphragm. No focal airspace opacities. No pleural effusion or pneumothorax. No evidence of edema. No acute osseous abnormalities. IMPRESSION: Cardiomegaly without acute cardiopulmonary  disease. Electronically Signed   By: Sandi Mariscal M.D.   On: 09/08/2015 09:56   Dg Hip Unilat With Pelvis 1v Right  09/08/2015  CLINICAL DATA:  Acute onset of right hip pain.  Initial encounter. EXAM: DG HIP (WITH OR WITHOUT PELVIS) 1V RIGHT COMPARISON:  Right hip radiographs performed 09/07/2015 FINDINGS: There is superior dislocation of the right hip prosthesis. No fracture is seen. There is chronic superior displacement of the right hip prosthesis, likely degenerative in nature. There is mild axial joint space narrowing at the left hip. Degenerative change is noted about the pubic symphysis. Degenerative change is seen along the lower lumbar spine. Scattered phleboliths are seen. IMPRESSION: Superior dislocation of the right hip prosthesis. Electronically Signed   By: Garald Balding M.D.   On: 09/08/2015 02:34   Dg Hip Unilat With Pelvis 1v Right  09/07/2015  CLINICAL DATA:  Status post hip reduction EXAM: DG HIP (WITH OR WITHOUT PELVIS) 1V RIGHT COMPARISON:  Sep 07, 2015 FINDINGS: Two images were obtained. On the first, the hip was still dislocated. On the second, the head of the femoral hardware appeared to be located within the acetabular component. No other acute abnormalities. IMPRESSION: Right hip re- location. Electronically Signed   By: Dorise Bullion III M.D   On: 09/07/2015 21:22   Dg Hip Port Unilat With Pelvis 1v Right  09/08/2015  CLINICAL DATA:  Post reduction EXAM: DG HIP (WITH OR WITHOUT PELVIS) 1V PORT RIGHT COMPARISON:  09/08/2015 at 0149 hours FINDINGS: Interval reduction of the right hip arthroplasty. No definite fracture is seen.  Heterotopic calcification is present. IMPRESSION: Interval reduction of the right hip arthroplasty. Electronically Signed   By: Julian Hy M.D.   On: 09/08/2015 07:09   Dg Hip Unilat With Pelvis 2-3 Views Right  09/07/2015  CLINICAL DATA:  Heard pop at right hip while bending over. Concern for right hip prosthesis dislocation. Initial encounter.  EXAM: DG HIP (WITH OR WITHOUT PELVIS) 2-3V RIGHT COMPARISON:  Right hip radiographs performed 08/12/2015 FINDINGS: There is recurrent superior dislocation of the patient's right hip prosthesis. No fracture is seen. Surrounding heterotopic bone formation is again noted. Mild axial joint space narrowing is noted at the left hip. Degenerative change is seen along the lumbar spine. Scattered phleboliths are seen. IMPRESSION: Recurrent superior dislocation of the right hip prosthesis. No evidence of fracture. Mild axial joint space narrowing at the left hip. Electronically Signed   By: Garald Balding M.D.   On: 09/07/2015 19:56     Labs:   Basic Metabolic Panel:  Recent Labs Lab 09/07/15 1855 09/09/15 0239 09/10/15 0252  NA 137 137 133*  K 4.0 3.9 3.9  CL 99* 103 100*  CO2 26 28 27   GLUCOSE 98 108* 109*  BUN 14 10 10   CREATININE 1.09 0.97 0.91  CALCIUM 8.9 8.7* 8.2*   GFR CrCl cannot be calculated (Unknown ideal weight.). Liver Function Tests:  Recent Labs Lab 09/08/15 0919  AST 24  ALT 11*  ALKPHOS 67  BILITOT 1.6*  PROT 6.6  ALBUMIN 3.3*   No results for input(s): LIPASE, AMYLASE in the last 168 hours. No results for input(s): AMMONIA in the  last 168 hours. Coagulation profile  Recent Labs Lab 09/07/15 1855  INR 1.22    CBC:  Recent Labs Lab 09/07/15 1855 09/09/15 0239 09/10/15 0252  WBC 5.5 6.6 9.2  NEUTROABS 3.3  --   --   HGB 10.4* 8.8* 8.1*  HCT 32.2* 26.8* 24.7*  MCV 98.5 96.8 93.9  PLT 115* 93* 82*   Cardiac Enzymes: No results for input(s): CKTOTAL, CKMB, CKMBINDEX, TROPONINI in the last 168 hours. BNP: Invalid input(s): POCBNP CBG: No results for input(s): GLUCAP in the last 168 hours. D-Dimer No results for input(s): DDIMER in the last 72 hours. Hgb A1c No results for input(s): HGBA1C in the last 72 hours. Lipid Profile No results for input(s): CHOL, HDL, LDLCALC, TRIG, CHOLHDL, LDLDIRECT in the last 72 hours. Thyroid function studies No  results for input(s): TSH, T4TOTAL, T3FREE, THYROIDAB in the last 72 hours.  Invalid input(s): FREET3 Anemia work up No results for input(s): VITAMINB12, FOLATE, FERRITIN, TIBC, IRON, RETICCTPCT in the last 72 hours. Microbiology Recent Results (from the past 240 hour(s))  Surgical pcr screen     Status: None   Collection Time: 09/08/15  2:05 PM  Result Value Ref Range Status   MRSA, PCR NEGATIVE NEGATIVE Final   Staphylococcus aureus NEGATIVE NEGATIVE Final    Comment:        The Xpert SA Assay (FDA approved for NASAL specimens in patients over 45 years of age), is one component of a comprehensive surveillance program.  Test performance has been validated by [facility] for patients greater than or equal to 31 year old. It is not intended to diagnose infection nor to guide or monitor treatment.      Discharge Instructions:       Discharge Instructions    (HEART FAILURE PATIENTS) Call MD:  Anytime you have any of the following symptoms: 1) 3 pound weight gain in 24 hours or 5 pounds in 1 week 2) shortness of breath, with or without a dry hacking cough 3) swelling in the hands, feet or stomach 4) if you have to sleep on extra pillows at night in order to breathe.    Complete by:  As directed      Call MD for:  redness, tenderness, or signs of infection (pain, swelling, redness, odor or green/yellow discharge around incision site)    Complete by:  As directed      Call MD for:  temperature >100.4    Complete by:  As directed      Diet - low sodium heart healthy    Complete by:  As directed      Discharge instructions    Complete by:  As directed   Incentive spirometry Home health PT CBC, BMP 1 week     Increase activity slowly    Complete by:  As directed             Medication List    TAKE these medications        aspirin EC 325 MG tablet  Take 1 tablet (325 mg total) by mouth 2 (two) times daily.     carvedilol 12.5 MG tablet  Commonly known as:  COREG   Take 0.5 tablets (6.25 mg total) by mouth 2 (two) times daily with a meal.     docusate sodium 100 MG capsule  Commonly known as:  COLACE  Take 1 capsule (100 mg total) by mouth 2 (two) times daily.     folic acid 1 MG tablet  Commonly known as:  FOLVITE  Take 1 tablet (1 mg total) by mouth daily.     furosemide 40 MG tablet  Commonly known as:  LASIX  Take 1 tablet (40 mg total) by mouth 2 (two) times daily. Take 1 tablet 3 times daily for 4 days then decrease back down to 1 tablet twice daily     HYDROcodone-acetaminophen 5-325 MG tablet  Commonly known as:  NORCO/VICODIN  Take 1-2 tablets by mouth every 4 (four) hours as needed.     lidocaine 5 %  Commonly known as:  LIDODERM  Place 1 patch onto the skin 2 (two) times daily as needed. Remove & Discard patch within 12 hours or as directed by MD     losartan 25 MG tablet  Commonly known as:  COZAAR  Take 0.5 tablets (12.5 mg total) by mouth daily.     methocarbamol 500 MG tablet  Commonly known as:  ROBAXIN  Take 1 tablet (500 mg total) by mouth 2 (two) times daily with a meal.     methocarbamol 500 MG tablet  Commonly known as:  ROBAXIN  Take 1 tablet (500 mg total) by mouth 2 (two) times daily with a meal.     multivitamin with minerals Tabs tablet  Take 1 tablet by mouth daily.     oxyCODONE-acetaminophen 5-325 MG tablet  Commonly known as:  ROXICET  Take 1 tablet by mouth every 4 (four) hours as needed.     potassium chloride SA 20 MEQ tablet  Commonly known as:  K-DUR,KLOR-CON  Take 2 tablets (40 mEq total) by mouth daily.     thiamine 100 MG tablet  Take 1 tablet (100 mg total) by mouth daily.       Follow-up Information    Follow up with Kerin Salen, MD In 2 weeks.   Specialty:  Orthopedic Surgery   Contact information:   [place] [address] [phone]       Follow up with [facility].   Why:  They will contact you to schedule home therapy visits.   Contact  information:   [address] [place] [address] [phone]       Follow up with Hoyt Koch, MD In 1 week.   Specialty:  Internal Medicine   Contact information:   [place]  [id number] [phone]        Time coordinating discharge: 35 min  Signed:  Matix Henshaw U Eliany Mccarter   [facility] 09/10/2015, 2:58 PM

## 2015-09-10 NOTE — Care Management Note (Addendum)
Case Management Note  Patient Details  Name: Thomas [name] MRN: [id number] Date of Birth: 1951-03-22  Subjective/Objective:         revision of right total hip arthroplasty            Action/Plan: Spoke with patient about discharge plan, gave choice for home health, he selected [facility]. Contacted Manuela [facility] with [facility] and set up HHPT. Patient stated that he has rolling walker and does not need 3N1. No equipment needs identified by therapy. Patient stated that his mother will be assisting him after discharge. He stated that his mother is able to assist as needed.   HHOT and [facility] for medication review added to home health order, contacted Manuela Schwartz at [facility] abd set up [facility] and [facility].     Expected Discharge Date:                  Expected Discharge Plan:  [place]  In-House Referral:  NA  Discharge planning Services  CM Consult  Post Acute Care Choice:  Home Health Choice offered to:  Patient  DME Arranged:  N/A DME Agency:     HH Arranged:  PT Gila:  [facility]  Status of Service:  Completed, signed off  Medicare Important Message Given:    Date Medicare IM Given:    Medicare IM give by:    Date Additional Medicare IM Given:    Additional Medicare Important Message give by:     If discussed at [place] of Stay Meetings, dates discussed:    Additional Comments:  Nila Nephew, RN 09/10/2015, 11:31 AM

## 2015-09-10 NOTE — Progress Notes (Signed)
Occupational Therapy Evaluation Patient Details Name: Thomas [name] MRN: [id number] DOB: 06/28/1950 Today's Date: 09/10/2015    History of Present Illness pt presents after repeated dislocations of R THR and is now s/p Revision.  pt with hx of Etoh, Cirrhosis, HTN, A-fib, HF, Cardiomyopathy, and Pancytopenia.     Clinical Impression   PTA, pt was independent with ADLs and used [facility] for mobility. Pt currently requires min guard assist for all LB ADLs and transfers due to acute pain and resulting balance deficits. Pt required mod verbal cues for posterior hip precautions during ADLs and cues for safety. Pt plans to d/c home with supervision assistance from his mother. Pt will benefit from continued acute OT to increase independence and safety with ADLs and mobility to allow for safe discharge home. No OT follow up or DME recommended at this time.    Follow Up Recommendations  No OT follow up;Supervision/Assistance - 24 hour    Equipment Recommendations  None recommended by OT    Recommendations for Other Services       Precautions / Restrictions Precautions Precautions: Posterior Hip;Fall Precaution Booklet Issued: Yes (comment) Precaution Comments: Pt able to recall 2/3 posterior hip precautions and able to verbalize 3rd precaution with 1 cue.  Restrictions Weight Bearing Restrictions: Yes RLE Weight Bearing: Weight bearing as tolerated      Mobility Bed Mobility Overal bed mobility: Needs Assistance Bed Mobility: Supine to Sit     Supine to sit: Supervision     General bed mobility comments: Pt up in chair on OT arrival  Transfers Overall transfer level: Needs assistance Equipment used: Rolling walker (2 wheeled) Transfers: Sit to/from Stand Sit to Stand: Min assist         General transfer comment: Min assist for initial sit-stand and min guard for subsequent transfers. Cues for safe hand placement has pt with heavy use of UE's for boost to stand.    Balance  Overall balance assessment: Needs assistance Sitting-balance support: No upper extremity supported;Feet supported Sitting balance-Leahy Scale: Fair     Standing balance support: During functional activity;Single extremity supported Standing balance-Leahy Scale: Fair                              ADL Overall ADL's : Needs assistance/impaired     Grooming: Wash/dry hands;Min guard;Standing   Upper Body Bathing: Set up;Sitting   Lower Body Bathing: Set up;Sit to/from stand;Cueing for safety Lower Body Bathing Details (indicate cue type and reason): cues for precautions Upper Body Dressing : Supervision/safety;Sitting   Lower Body Dressing: Supervision/safety;With adaptive equipment;Sit to/from stand   Toilet Transfer: Min guard;Cueing for safety;Ambulation;Comfort height toilet;RW   Toileting- Water quality scientist and Hygiene: Min guard;Sit to/from stand     Tub/Shower Transfer Details (indicate cue type and reason): Discussed proper step sequence with RW for tub transfer. Pt reported he will sponge bathe until stronger Functional mobility during ADLs: Min guard;Rolling walker General ADL Comments: Pt requires emphasis on precautions. Has AE at home and understands how to use it. No family present for OT eval.     Vision Vision Assessment?: No apparent visual deficits   Perception     Praxis      Pertinent Vitals/Pain Pain Assessment: 0-10 Pain Score: 4  Pain Location: R hip Pain Descriptors / Indicators: Sore Pain Intervention(s): Limited activity within patient's tolerance;Monitored during session;Premedicated before session;Repositioned;Ice applied     Hand Dominance Right   Extremity/Trunk Assessment Upper Extremity  Assessment Upper Extremity Assessment: Overall WFL for tasks assessed   Lower Extremity Assessment Lower Extremity Assessment: Generalized weakness;RLE deficits/detail RLE Deficits / Details: Generally weak post-op, but AROM WFL and  strength grossly 4-/5.  Sensation intact.   Cervical / Trunk Assessment Cervical / Trunk Assessment: Kyphotic;Other exceptions Cervical / Trunk Exceptions: pt appears scoliotic, but no hx.  R hip elevated more than L.   Communication Communication Communication: No difficulties   Cognition Arousal/Alertness: Awake/alert Behavior During Therapy: WFL for tasks assessed/performed Overall Cognitive Status: Within Functional Limits for tasks assessed       Memory: Decreased recall of precautions             General Comments       Exercises Exercises: Total Joint     Shoulder Instructions      Home Living Family/patient expects to be discharged to:: Private residence Living Arrangements: Parent Available Help at Discharge: Family;Available 24 hours/day Type of Home: House Home Access: Stairs to enter [employer] of Steps: 2 Entrance Stairs-Rails: Right Home Layout: One level     Bathroom Shower/Tub: Tub/shower unit;Curtain Shower/tub characteristics: Architectural technologist: Standard Bathroom Accessibility: No   Home Equipment: Environmental consultant - 2 wheels;Cane - single point;Shower seat;Grab bars - toilet;Grab bars - tub/shower;Hand held shower head;Adaptive equipment Adaptive Equipment: Reacher;Sock aid;Long-handled shoe horn;Long-handled sponge Additional Comments: Has been to rehab facility      Prior Functioning/Environment Level of Independence: Needs assistance  Gait / Transfers Assistance Needed: Uses cane vs RW as pt has dislocated hip multiple times since replacement in January. ADL's / Homemaking Assistance Needed: Mother performs homemaking tasks, but pt had been doing his personal ADLs.          OT Diagnosis: Generalized weakness;Acute pain   OT Problem List: Decreased range of motion;Decreased strength;Decreased activity tolerance;Impaired balance (sitting and/or standing);Decreased safety awareness;Decreased knowledge of use of DME or AE;Decreased  knowledge of precautions;Pain;Obesity   OT Treatment/Interventions: Self-care/ADL training;Therapeutic exercise;Energy conservation;DME and/or AE instruction;Therapeutic activities;Patient/family education;Balance training    OT Goals(Current goals can be found in the care plan section) Acute Rehab OT Goals Patient Stated Goal: Home today OT Goal Formulation: With patient Time For Goal Achievement: 09/24/15 Potential to Achieve Goals: Good ADL Goals Pt Will Perform Tub/Shower Transfer: Tub transfer;with supervision;ambulating;shower seat;rolling walker Additional ADL Goal #1: Pt will verbalize and demonstrate adherence to 3/3 posterior hip precautions during ADLs and transfers.  OT Frequency: Min 2X/week   Barriers to D/C:            Co-evaluation              End of Session Equipment Utilized During Treatment: Gait belt;Rolling walker Nurse Communication: Mobility status;Other (comment) (BP drop with activity)  Activity Tolerance: Patient tolerated treatment well Patient left: in chair;with call bell/phone within reach   Time: 0936-1000 OT Time Calculation (min): 24 min Charges:  OT General Charges $OT Visit: 1 Procedure OT Evaluation $OT Eval Moderate Complexity: 1 Procedure OT Treatments $Self Care/Home Management : 8-22 mins G-Codes:    Redmond Baseman, OTR/L PagerUD:[phone] 09/10/2015, 10:22 AM

## 2015-09-10 NOTE — Evaluation (Signed)
Physical Therapy Evaluation Patient Details Name: Nail Seeton MRN: [id number] DOB: [birth year] Today's Date: 09/10/2015   History of Present Illness  pt presents after repeated dislocations of R THR and is now s/p Revision.  pt with hx of Etoh, Cirrhosis, HTN, A-fib, HF, Cardiomyopathy, and Pancytopenia.    Clinical Impression  Pt eager for mobility and no physical A needed, just occasional cues for following hip precautions.  Strong pt ed on need for following hip precautions and risk of continued dislocation.  Pt indicates his mother is able to help him at home and is eager for D/C today.  Pt is ready for D/C from PT perspective, but will continue to follow if remains on acute.      Follow Up Recommendations Home health PT;Supervision for mobility/OOB    Equipment Recommendations  None recommended by PT    Recommendations for Other Services       Precautions / Restrictions Precautions Precautions: Posterior Hip;Fall Precaution Booklet Issued: Yes (comment) Precaution Comments: Reviewed Precautions and emphasized importance of following precautions.   Restrictions Weight Bearing Restrictions: Yes RLE Weight Bearing: Weight bearing as tolerated      Mobility  Bed Mobility Overal bed mobility: Needs Assistance Bed Mobility: Supine to Sit     Supine to sit: Supervision     General bed mobility comments: pt needs increased time and cues to attend to hip precautions.  No physical A needed.  Heavy use of UEs.  Transfers Overall transfer level: Needs assistance Equipment used: Rolling walker (2 wheeled) Transfers: Sit to/from Stand Sit to Stand: Supervision         General transfer comment: pt able to come to stand without physical A, but with heavy use of UEs.    Ambulation/Gait Ambulation/Gait assistance: Supervision Ambulation Distance (Feet): 300 Feet Assistive device: Rolling walker (2 wheeled) Gait Pattern/deviations: Step-through pattern;Decreased stride  length;Trunk flexed;Wide base of support     General Gait Details: pt indicates LE edema making it more difficult to ambulate and needing increased BOS.  pt indicates no pain during ambulation and no A needed.  Upon return to room RN indicated pt's HR in A-fib up to 160s during ambulation.  pt indicates not noticing elevated HR.    Stairs Stairs: Yes Stairs assistance: Supervision Stair Management: Two rails;Step to pattern;Forwards Number of Stairs: 3 General stair comments: cues for stair gait, but not needed as pt able to use either LE on stairs.    Wheelchair Mobility    Modified Rankin (Stroke Patients Only)       Balance Overall balance assessment: Needs assistance Sitting-balance support: Single extremity supported;Feet supported Sitting balance-Leahy Scale: Fair     Standing balance support: Single extremity supported;Bilateral upper extremity supported;During functional activity Standing balance-Leahy Scale: Fair                               Pertinent Vitals/Pain Pain Assessment: No/denies pain    Home Living Family/patient expects to be discharged to:: Private residence Living Arrangements: Parent Available Help at Discharge: Family;Available 24 hours/day Type of Home: House Home Access: Stairs to enter Entrance Stairs-Rails: Right (States there is a pole on L he can hold too.) Entrance Stairs-Number of Steps: 2 Home Layout: One level Home Equipment: Walker - 2 wheels;Cane - single point;Bedside commode;Shower seat;Grab bars - tub/shower;Hand held shower head;Adaptive equipment      Prior Function Level of Independence: Needs assistance   Gait / Transfers Assistance Needed:  Uses cane vs RW as pt has dislocated hip multiple times since replacement in January.  ADL's / Homemaking Assistance Needed: Mother performs homemaking tasks, but pt had been doing his personal ADLs.          Hand Dominance   Dominant Hand: Right    Extremity/Trunk  Assessment   Upper Extremity Assessment: Defer to OT evaluation           Lower Extremity Assessment: Generalized weakness;RLE deficits/detail RLE Deficits / Details: Generally weak post-op, but AROM WFL and strength grossly 4-/5.  Sensation intact.    Cervical / Trunk Assessment: Kyphotic;Other exceptions  Communication   Communication: No difficulties  Cognition Arousal/Alertness: Awake/alert Behavior During Therapy: WFL for tasks assessed/performed Overall Cognitive Status: Within Functional Limits for tasks assessed                      General Comments      Exercises Total Joint Exercises Quad Sets: AROM;Right;10 reps Hip ABduction/ADduction: AROM;Right;10 reps Long Arc Quad: AROM;Right;15 reps      Assessment/Plan    PT Assessment Patient needs continued PT services  PT Diagnosis Difficulty walking   PT Problem List Decreased strength;Decreased activity tolerance;Decreased balance;Decreased mobility;Decreased knowledge of use of DME;Decreased knowledge of precautions;Cardiopulmonary status limiting activity;Obesity  PT Treatment Interventions DME instruction;Gait training;Stair training;Functional mobility training;Therapeutic activities;Balance training;Therapeutic exercise;Patient/family education   PT Goals (Current goals can be found in the Care Plan section) Acute Rehab PT Goals Patient Stated Goal: Home today PT Goal Formulation: With patient Time For Goal Achievement: 09/17/15 Potential to Achieve Goals: Good    Frequency 7X/week   Barriers to discharge        Co-evaluation               End of Session Equipment Utilized During Treatment: Gait belt Activity Tolerance: Patient tolerated treatment well Patient left: in chair;with call bell/phone within reach Nurse Communication: Mobility status         Time: [id number] PT Time Calculation (min) (ACUTE ONLY): 20 min   Charges:   PT Evaluation $PT Eval Moderate Complexity: 1  Procedure     PT G CodesCatarina Hartshorn, Kellogg 09/10/2015, 9:39 AM

## 2015-09-10 NOTE — Op Note (Addendum)
Date of surgery: 09/09/2015  Preop diagnosis: Recurrent dislocations Right total hip  Postoperative diagnosis: Same  Procedure: Revision R total hip arthroplasty with removal of acetabular liner and placement of a 60 mm DePuy 0 constrained liner and a +536 mm femoral head component   Surgeon: Kathalene Frames. Mayer Camel M.D.  Assistant: Kerry Hough. Barton Dubois  (present throughout entire procedure and necessary for timely completion of the procedure)  Estimated blood loss: 700 cc  Fluid replacement: 20000 cc of crystalloid, 2 units packed red blood cells  Complications: None  Specimens: none  Indications: Patient with recurrent dislocations of right total hip 4. 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 History of alcoholic cirrhosis and poor compliance with medical instructions, total hip was geometrically stable Patient is failed conservative treatment with reeducation, hinge abduction brace, and radiographs show good geometry of the acetabular and femoral components. No history of fevers chills or wound problems. Prevent further dislocations risks and benefits of constrained liner system have been discussed with the patient and consent obtained for surgical intervention   Procedure: Patient was identified by arm band receive preoperative IV antibiotics in the holding area at, [facility] hospital. Taken to the operating room where the appropriate anesthetic monitors were attached and general endotracheal anesthesia induced with the patient in the supine position. Rolled into the left lateral decubitus position and fixed there with a mark 2 pelvic clamp. The limb prepped and draped in usual sterile fashion from the ankle to the hemipelvis. Time out procedure performed. Skin along the lateral hip and thigh infiltrated with 20 cc of 1/2% Marcaine and epinephrine solution. We began the operation by  recreating the old posterior lateral incision 25 cm in line through the skin and subcutaneous tissue down to the level of the IT band which was cut in line with the skin incision. This exposed the greater trochanter, and the posterior capsule is absent, there was a large hematoma of dark blood from his recent dislocation 2 weeks ago a proximally 500 mL the hematoma was evacuated.. We then dislocated a total hip and removed the femoral head with a mallet and metal cylinder.  We continued to remove scar tissue from around the acetabular component. The trunnion was tucked superiorly and anteriorly to the acetabular component, and the polyethylene liner was removed using the extraction tool with removal of bony locking pins. We then carefully cleared around the edges of the acetabular component in preparation for the constrained liner and confirm there was good bone ingrowth. A 60 mm, 0, constrained liner was then selected at the appropriate size and inserted into the acetabular shell and locked into place with care to not been the locking ring. The outer locking ring with the bevel facing towards the polyethylene liner was placed on the trunnion followed by the femoral head and the hip reduced and the locking ring snapped into place. Stability was checked and found to be excellent. The wound is irrigated out normal saline solution. We then closed the IT band with running #1 Vicryl suture, the subcutaneous tissue with 0 and 2-0 undyed Vicryl suture, the skin was closed with running interlocking 3-0 nylon suture. A dressing of Mepilex was then applied, the patient was unclamped a rolled supine awakened extubated and taken to the recovery without difficulty.

## 2015-09-11 LAB — TYPE AND SCREEN
ABO/RH(D): A POS
ANTIBODY SCREEN: NEGATIVE
UNIT DIVISION: 0
UNIT DIVISION: 0
Unit division: 0
Unit division: 0

## 2015-09-17 ENCOUNTER — Other Ambulatory Visit: Payer: Self-pay | Admitting: *Deleted

## 2015-09-17 DIAGNOSIS — E875 Hyperkalemia: Secondary | ICD-10-CM

## 2015-09-17 NOTE — Anesthesia Postprocedure Evaluation (Signed)
Anesthesia Post Note  Patient: Thomas [name]  Procedure(s) Performed: Procedure(s) (LRB): TOTAL HIP REVISION (Right)  Patient location during evaluation: PACU Anesthesia Type: General Level of consciousness: awake and alert Pain management: pain level controlled Vital Signs Assessment: post-procedure vital signs reviewed and stable Respiratory status: spontaneous breathing, nonlabored ventilation, respiratory function stable and patient connected to nasal cannula oxygen Cardiovascular status: blood pressure returned to baseline and stable Postop Assessment: no signs of nausea or vomiting Anesthetic complications: no                 Zenaida Deed

## 2015-10-03 ENCOUNTER — Ambulatory Visit (INDEPENDENT_AMBULATORY_CARE_PROVIDER_SITE_OTHER): Payer: Commercial Managed Care - HMO | Admitting: Internal Medicine

## 2015-10-03 ENCOUNTER — Encounter: Payer: Self-pay | Admitting: Internal Medicine

## 2015-10-03 VITALS — BP 134/98 | HR 85 | Temp 98.4°F | Resp 18 | Ht 72.0 in | Wt 317.0 lb

## 2015-10-03 DIAGNOSIS — N179 Acute kidney failure, unspecified: Secondary | ICD-10-CM | POA: Diagnosis not present

## 2015-10-03 DIAGNOSIS — Z96649 Presence of unspecified artificial hip joint: Principal | ICD-10-CM

## 2015-10-03 DIAGNOSIS — R609 Edema, unspecified: Secondary | ICD-10-CM

## 2015-10-03 DIAGNOSIS — I5022 Chronic systolic (congestive) heart failure: Secondary | ICD-10-CM

## 2015-10-03 DIAGNOSIS — T84029D Dislocation of unspecified internal joint prosthesis, subsequent encounter: Secondary | ICD-10-CM

## 2015-10-03 DIAGNOSIS — R600 Localized edema: Secondary | ICD-10-CM

## 2015-10-03 MED ORDER — FUROSEMIDE 40 MG PO TABS: 40.0000 mg | ORAL_TABLET | Freq: Three times a day (TID) | ORAL | Status: DC

## 2015-10-03 NOTE — Progress Notes (Signed)
Pre visit review using our clinic review tool, if applicable. No additional management support is needed unless otherwise documented below in the visit note. 

## 2015-10-03 NOTE — Patient Instructions (Signed)
We will increase the lasix (furosemide) to 3 pills a day until you see the heart doctor back again.   Try to keep the legs elevated at home with several pillows to let gravity help get the fluid out of the legs.

## 2015-10-04 ENCOUNTER — Encounter: Payer: Self-pay | Admitting: Internal Medicine

## 2015-10-04 NOTE — Assessment & Plan Note (Signed)
Most recent stable, needs repeat when he sees cardiology since we are increasing his lasix today.

## 2015-10-04 NOTE — Assessment & Plan Note (Signed)
Inadequate diuresis with current regimen after surgery. He is up and walking now but was not as mobile after. Increase lasix to TID until he sees cardiology in about 1-2 weeks. Needs  BMP at that visit. BP is slightly high today so if still high would increase his losartan back to whole tablet.

## 2015-10-04 NOTE — Assessment & Plan Note (Signed)
Increasing his lasix but also talked to him about the need to elevate his legs when stationary with pillows. He stated was resting them but not propped. We discussed gravity and the need for elevation to help with fluid levels.

## 2015-10-04 NOTE — Assessment & Plan Note (Signed)
Recent surgery to help this to not happen. No dislocation in the last 3 weeks. He has been working on not falling and denies significant fall in the last 3 weeks. Off pain meds which is great.

## 2015-10-04 NOTE — Progress Notes (Signed)
   Subjective:    Patient ID: Thomas [name], male    DOB: 21-Oct-1950, 65 y.o.   MRN: [id number]  HPI The patient is a 65 YO man coming in for hospital follow up (been in the hospital many times for hip replacement and repair from dislocation in the recent past, most recently surgery about 3 weeks ago to try to stop it from dislocating). He did get significant swelling in his right leg an the left after the surgery. He is off all the pain meds and able to walk. His legs are very swollen and he is taking fluid pill twice a day. This is making him urinate but the fluid is still there. Denies SOB, chills, chest pains. No diarrhea or constipation.   PMH, [facility], social history reviewed and updated.   Review of Systems  Constitutional: Negative for fever, activity change, appetite change and fatigue.  HENT: Negative.   Eyes: Negative.   Respiratory: Negative for cough, chest tightness, shortness of breath and wheezing.   Cardiovascular: Positive for leg swelling. Negative for chest pain and palpitations.  Gastrointestinal: Negative for nausea, abdominal pain, diarrhea, constipation and abdominal distention.  Musculoskeletal: Positive for myalgias, arthralgias and gait problem. Negative for back pain.  Skin: Negative.   Neurological: Negative for dizziness, syncope, weakness, light-headedness and headaches.  Psychiatric/Behavioral: Negative.       Objective:   Physical Exam  Constitutional: He is oriented to person, place, and time. He appears well-developed and well-nourished.  HENT:  Head: Normocephalic and atraumatic.  Eyes: EOM are normal.  Neck: Normal range of motion.  Cardiovascular: Normal rate.   Irreg irreg  Pulmonary/Chest: Effort normal and breath sounds normal. No respiratory distress. He has no wheezes. He has no rales.  Abdominal: Soft. Bowel sounds are normal. He exhibits no distension. There is no tenderness. There is no rebound.  Musculoskeletal: He exhibits edema.  2-3+  edema bilateral legs to the thigh, color change consistent with chronic venous stasis.  Neurological: He is alert and oriented to person, place, and time. Coordination normal.  Skin: Skin is warm and dry.  Psychiatric: He has a normal mood and affect.   Filed Vitals:   10/03/15 1015  BP: 134/98  Pulse: 85  Temp: 98.4 F (36.9 C)  TempSrc: Oral  Resp: 18  Height: 6' (1.829 m)  Weight: 317 lb (143.79 kg)  SpO2: 92%      Assessment & Plan:

## 2015-10-23 ENCOUNTER — Emergency Department (HOSPITAL_COMMUNITY)
Admission: EM | Admit: 2015-10-23 | Discharge: 2015-10-23 | Disposition: A | Discharge status: Home / Self Care | Payer: Commercial Managed Care - HMO | Attending: Emergency Medicine | Admitting: Emergency Medicine

## 2015-10-23 ENCOUNTER — Emergency Department (HOSPITAL_BASED_OUTPATIENT_CLINIC_OR_DEPARTMENT_OTHER)
Admit: 2015-10-23 | Discharge: 2015-10-23 | Disposition: A | Discharge status: Home / Self Care | Payer: Commercial Managed Care - HMO | Attending: Emergency Medicine | Admitting: Emergency Medicine

## 2015-10-23 ENCOUNTER — Encounter (HOSPITAL_COMMUNITY): Payer: Self-pay | Admitting: Emergency Medicine

## 2015-10-23 ENCOUNTER — Emergency Department (HOSPITAL_COMMUNITY): Payer: Commercial Managed Care - HMO

## 2015-10-23 ENCOUNTER — Other Ambulatory Visit: Payer: Self-pay

## 2015-10-23 DIAGNOSIS — X58XXXA Exposure to other specified factors, initial encounter: Secondary | ICD-10-CM | POA: Diagnosis not present

## 2015-10-23 DIAGNOSIS — Y999 Unspecified external cause status: Secondary | ICD-10-CM | POA: Insufficient documentation

## 2015-10-23 DIAGNOSIS — I5022 Chronic systolic (congestive) heart failure: Secondary | ICD-10-CM | POA: Insufficient documentation

## 2015-10-23 DIAGNOSIS — Z7982 Long term (current) use of aspirin: Secondary | ICD-10-CM | POA: Insufficient documentation

## 2015-10-23 DIAGNOSIS — I110 Hypertensive heart disease with heart failure: Secondary | ICD-10-CM | POA: Diagnosis not present

## 2015-10-23 DIAGNOSIS — M79609 Pain in unspecified limb: Secondary | ICD-10-CM

## 2015-10-23 DIAGNOSIS — Z96641 Presence of right artificial hip joint: Secondary | ICD-10-CM | POA: Diagnosis not present

## 2015-10-23 DIAGNOSIS — R609 Edema, unspecified: Secondary | ICD-10-CM | POA: Diagnosis not present

## 2015-10-23 DIAGNOSIS — Y929 Unspecified place or not applicable: Secondary | ICD-10-CM | POA: Diagnosis not present

## 2015-10-23 DIAGNOSIS — S7011XA Contusion of right thigh, initial encounter: Secondary | ICD-10-CM | POA: Diagnosis not present

## 2015-10-23 DIAGNOSIS — Z79899 Other long term (current) drug therapy: Secondary | ICD-10-CM | POA: Diagnosis not present

## 2015-10-23 DIAGNOSIS — D696 Thrombocytopenia, unspecified: Secondary | ICD-10-CM | POA: Insufficient documentation

## 2015-10-23 DIAGNOSIS — S79911A Unspecified injury of right hip, initial encounter: Secondary | ICD-10-CM | POA: Diagnosis present

## 2015-10-23 DIAGNOSIS — Y939 Activity, unspecified: Secondary | ICD-10-CM | POA: Diagnosis not present

## 2015-10-23 LAB — URINE MICROSCOPIC-ADD ON

## 2015-10-23 LAB — BASIC METABOLIC PANEL
ANION GAP: 14 (ref 5–15)
Anion gap: 14 (ref 5–15)
BUN: 10 mg/dL (ref 6–20)
BUN: 11 mg/dL (ref 6–20)
CHLORIDE: 98 mmol/L — AB (ref 101–111)
CHLORIDE: 98 mmol/L — AB (ref 101–111)
CO2: 24 mmol/L (ref 22–32)
CO2: 24 mmol/L (ref 22–32)
CREATININE: 1.24 mg/dL (ref 0.61–1.24)
Calcium: 9.1 mg/dL (ref 8.9–10.3)
Calcium: 8.9 mg/dL (ref 8.9–10.3)
Creatinine, Ser: 1.32 mg/dL — ABNORMAL HIGH (ref 0.61–1.24)
GFR calc Af Amer: >60 mL/min (ref >60)
GFR calc Af Amer: >60 mL/min (ref >60)
GFR calc non Af Amer: >60 mL/min (ref >60)
GFR, EST NON AFRICAN AMERICAN: 56 mL/min — AB (ref >60)
GLUCOSE: 91 mg/dL (ref 65–99)
Glucose, Bld: 95 mg/dL (ref 65–99)
POTASSIUM: 3.7 mmol/L (ref 3.5–5.1)
POTASSIUM: 3.6 mmol/L (ref 3.5–5.1)
Sodium: 136 mmol/L (ref 135–145)
Sodium: 136 mmol/L (ref 135–145)

## 2015-10-23 LAB — URINALYSIS, ROUTINE W REFLEX MICROSCOPIC
GLUCOSE, UA: NEGATIVE mg/dL
Hgb urine dipstick: NEGATIVE
Ketones, ur: >80 mg/dL — AB
NITRITE: NEGATIVE
PH: 6 (ref 5.0–8.0)
Protein, ur: 100 mg/dL — AB
SPECIFIC GRAVITY, URINE: 1.028 (ref 1.005–1.030)

## 2015-10-23 LAB — CBC WITH DIFFERENTIAL/PLATELET
Basophils Absolute: 0 10*3/uL (ref 0.0–0.1)
Basophils Relative: 0 %
EOS PCT: 0 %
Eosinophils Absolute: 0 10*3/uL (ref 0.0–0.7)
HEMATOCRIT: 37 % — AB (ref 39.0–52.0)
Hemoglobin: 12.2 g/dL — ABNORMAL LOW (ref 13.0–17.0)
LYMPHS ABS: 0.8 10*3/uL (ref 0.7–4.0)
LYMPHS PCT: 16 %
MCH: 33.8 pg (ref 26.0–34.0)
MCHC: 33 g/dL (ref 30.0–36.0)
MCV: 102.5 fL — AB (ref 78.0–100.0)
Monocytes Absolute: 0.5 10*3/uL (ref 0.1–1.0)
Monocytes Relative: 10 %
NEUTROS ABS: 3.5 10*3/uL (ref 1.7–7.7)
Neutrophils Relative %: 74 %
PLATELETS: 64 10*3/uL — AB (ref 150–400)
RBC: 3.61 MIL/uL — AB (ref 4.22–5.81)
RDW: 16.7 % — ABNORMAL HIGH (ref 11.5–15.5)
WBC: 4.7 10*3/uL (ref 4.0–10.5)

## 2015-10-23 LAB — PROTIME-INR
INR: 1.13 (ref 0.00–1.49)
Prothrombin Time: 14.7 seconds (ref 11.6–15.2)

## 2015-10-23 LAB — CBG MONITORING, ED: Glucose-Capillary: 109 mg/dL — ABNORMAL HIGH (ref 65–99)

## 2015-10-23 LAB — BRAIN NATRIURETIC PEPTIDE: B Natriuretic Peptide: 225.1 pg/mL — ABNORMAL HIGH (ref 0.0–100.0)

## 2015-10-23 MED ORDER — FUROSEMIDE 10 MG/ML IJ SOLN: 40.0000 mg | Freq: Once | INTRAMUSCULAR | Status: DC

## 2015-10-23 MED ORDER — FUROSEMIDE 20 MG PO TABS
40.0000 mg | ORAL_TABLET | Freq: Once | ORAL | Status: AC
  Administered 2015-10-23: 40 mg via ORAL
  Filled 2015-10-23: qty 2

## 2015-10-23 MED ORDER — SODIUM CHLORIDE 0.9 % IV BOLUS (SEPSIS)
1000.0000 mL | Freq: Once | INTRAVENOUS | Status: AC
  Administered 2015-10-23: 1000 mL via INTRAVENOUS

## 2015-10-23 NOTE — ED Notes (Signed)
Pt in from home via [facility] with c/o R hip pain and swelling x 55mo's. Per pt, a "hip extension" was done 101mo's ago, has had pain since, worsening the past 2 days. Per EMS, pt able to bear weight and walk to stretcher. Per pt, increased edema to BLE's. Alert, MAE's

## 2015-10-23 NOTE — Progress Notes (Signed)
Orthopedic Tech Progress Note Patient Details:  Thomas [name] [birth year] [id number]  Ortho Devices Type of Ortho Device:  (hip spica compression wrap) Ortho Device/Splint Location: waist/hip Ortho Device/Splint Interventions: Application As ordered by Dr. Rodman Comp, Thomas [name] 10/23/2015, 12:25 PM

## 2015-10-23 NOTE — ED Notes (Signed)
CBG 109

## 2015-10-23 NOTE — ED Notes (Signed)
Pt hooked up to monitors 

## 2015-10-23 NOTE — ED Provider Notes (Signed)
CSN: [id number]     Arrival date & time 10/23/15  [id number] History   First MD Initiated Contact with Patient 10/23/15 [phone]     Chief Complaint  Patient presents with  . Hip Pain     (Consider location/radiation/quality/duration/timing/severity/associated sxs/prior Treatment) HPI Comments: Patient presents by EMS with right hip pain as well as left foot pain for the past 2 days. Patient with history of recurrent right prosthetic hip dislocation status post revision in May by Dr. Mayer Camel. He has not seen him since. He denies any falls or injuries. States his hip pain has been bothering him on-and-off since his surgery and he feels that there is more swelling around his scar over the past several days. He does not take any blood thinners. Denies any new trauma. States he called EMS today because he had pain in his L foot when he tried to put weight on it.  He was able to walk to EMS stretcher.  States increased swelling to L foot.  Denies CP or SOB.  Denies abdominal pain, vomiting. States he has not seen his surgeon since his surgery.  Patient is a 65 y.o. male presenting with hip pain. The history is provided by the patient and the EMS personnel.  Hip Pain Pertinent negatives include no chest pain, no abdominal pain, no headaches and no shortness of breath.    Past Medical History  Diagnosis Date  . Obesity   . Arthritis   . Hypertension   . Chronic systolic CHF (congestive heart failure), NYHA class 2 ([place])   . DCM (dilated cardiomyopathy) ([place])     EF 30% but now improved to 40-45% by echo 2016 on medical therapy with moderate MR  . Pancytopenia ([place])   . ETOH abuse   . Alcoholic liver disease ([place])   . Gastroduodenitis     H Pylori positive  . Chronic atrial fibrillation ([name])     no an anticoagulation canditate due to alcohol abuse, liver cirrhosis with pancytopenia, medical noncompliance  . Diverticulosis of colon   . Portal hypertensive gastropathy   . Personal history of colonic  polyps - adenomas 08/21/2013  . Helicobacter pylori gastritis 10/19/2013  . Edema extremities 07/02/2015   Past Surgical History  Procedure Laterality Date  . Esophagogastroduodenoscopy N/A 08/21/2013    Procedure: ESOPHAGOGASTRODUODENOSCOPY (EGD);  Surgeon: Jerene Bears, MD;  Location: [facility] ENDOSCOPY;  Service: Endoscopy;  Laterality: N/A;  . Colonoscopy N/A 08/21/2013    Procedure: COLONOSCOPY;  Surgeon: Jerene Bears, MD;  Location[facility]COPY;  Service: Endoscopy;  Laterality: N/A;  . Total hip arthroplasty Right 05/20/2015    Procedure: TOTAL HIP ARTHROPLASTY;  Surgeon: Frederik Pear, MD;  Location: [place];  Service: Orthopedics;  Laterality: Right;  . Hip closed reduction Right 08/13/2015    Procedure: CLOSED REDUCTION HIP;  Surgeon: [name], MD;  Location: [place] NEURO ORS;  Service: Orthopedics;  Laterality: Right;  . Total hip revision Right 09/09/2015    Procedure: TOTAL HIP REVISION;  Surgeon: Frederik Pear, MD;  Location: [place];  Service: Orthopedics;  Laterality: Right;   Family History  Problem Relation Age of Onset  . Hypertension Father    Social History  Substance Use Topics  . Smoking status: Never Smoker   . Smokeless tobacco: Never Used  . Alcohol Use: 4.8 oz/week    8 Cans of beer per week     Comment: former ETOH abuse     Review of Systems  Constitutional: Positive for activity change.  Negative for fever, appetite change and fatigue.  HENT: Negative for congestion and rhinorrhea.   Respiratory: Negative for cough, chest tightness and shortness of breath.   Cardiovascular: Positive for leg swelling. Negative for chest pain.  Gastrointestinal: Negative for nausea, vomiting and abdominal pain.  Genitourinary: Negative for dysuria and hematuria.  Musculoskeletal: Positive for myalgias and arthralgias.  Skin: Negative for rash.  Neurological: Negative for dizziness, weakness, light-headedness and headaches.  A complete 10 system review of systems was obtained and all  systems are negative except as noted in the HPI and PMH.      Allergies  Review of patient's allergies indicates no known allergies.  Home Medications   Prior to Admission medications   Medication Sig Start Date End Date Taking? Authorizing Provider  aspirin EC 325 MG tablet Take 1 tablet (325 mg total) by mouth 2 (two) times daily. 09/09/15  Yes Leighton Parody, PA-C  carvedilol (COREG) 12.5 MG tablet Take 0.5 tablets (6.25 mg total) by mouth 2 (two) times daily with a meal. 09/10/15  Yes Geradine Girt, DO  docusate sodium (COLACE) 100 MG capsule Take 1 capsule (100 mg total) by mouth 2 (two) times daily. 09/10/15  Yes Geradine Girt, DO  folic acid (FOLVITE) 1 MG tablet Take 1 tablet (1 mg total) by mouth daily. 07/31/15  Yes Hoyt Koch, MD  furosemide (LASIX) 40 MG tablet Take 1 tablet (40 mg total) by mouth 3 (three) times daily. Patient taking differently: Take 40 mg by mouth 2 (two) times daily.  10/03/15  Yes Hoyt Koch, MD  lidocaine (LIDODERM) 5 % Place 1 patch onto the skin 2 (two) times daily as needed. Remove & Discard patch within 12 hours or as directed by MD Patient taking differently: Place 1 patch onto the skin 2 (two) times daily as needed (for nerve pain). Remove & Discard patch within 12 hours or as directed by MD 09/02/15  Yes Hoyt Koch, MD  losartan (COZAAR) 25 MG tablet Take 0.5 tablets (12.5 mg total) by mouth daily. 07/31/15  Yes Hoyt Koch, MD  methocarbamol (ROBAXIN) 500 MG tablet Take 1 tablet (500 mg total) by mouth 2 (two) times daily with a meal. 05/20/15  Yes Leighton Parody, PA-C  Multiple Vitamin (MULTIVITAMIN WITH MINERALS) TABS tablet Take 1 tablet by mouth daily. 08/24/13  Yes Geradine Girt, DO  potassium chloride SA (K-DUR,KLOR-CON) 20 MEQ tablet Take 2 tablets (40 mEq total) by mouth daily. 08/26/15  Yes Isaiah Serge, NP  thiamine 100 MG tablet Take 1 tablet (100 mg total) by mouth daily. 07/31/15  Yes Hoyt Koch, MD   BP 136/87 mmHg  Pulse 84  Temp(Src) 98.8 F (37.1 C) (Oral)  Resp 18  SpO2 98% Physical Exam  Constitutional: He is oriented to person, place, and time. He appears well-developed and well-nourished. No distress.  HENT:  Head: Normocephalic and atraumatic.  Mouth/Throat: Oropharynx is clear and moist. No oropharyngeal exudate.  Eyes: Conjunctivae and EOM are normal. Pupils are equal, round, and reactive to light.  Neck: Normal range of motion. Neck supple.  No meningismus.  Cardiovascular: Normal rate, regular rhythm, normal heart sounds and intact distal pulses.   No murmur heard. Pulmonary/Chest: Effort normal and breath sounds normal. No respiratory distress.  Abdominal: Soft. There is no tenderness. There is no rebound and no guarding.  Musculoskeletal: Normal range of motion. He exhibits edema. He exhibits no tenderness.  Edema to L dorsal foot.  TTP along plantar surface. Intact DP pulses bilaterally There is swelling and edema under R hip incision. No tenderness or fluctuance.  Able to range hips and knees bilaterally. No calf tenderness No T or L spine pain  Neurological: He is alert and oriented to person, place, and time. No cranial nerve deficit. He exhibits normal muscle tone. Coordination normal.  No ataxia on finger to nose bilaterally. No pronator drift. 5/5 strength throughout. CN 2-12 intact.Equal grip strength. Sensation intact.   Skin: Skin is warm.  Psychiatric: He has a normal mood and affect. His behavior is normal.  Nursing note and vitals reviewed.   ED Course  Procedures (including critical care time) Labs Review Labs Reviewed  CBC WITH DIFFERENTIAL/PLATELET - Abnormal; Notable for the following:    RBC 3.61 (*)    Hemoglobin 12.2 (*)    HCT 37.0 (*)    MCV 102.5 (*)    RDW 16.7 (*)    Platelets 64 (*)    All other components within normal limits  BASIC METABOLIC PANEL - Abnormal; Notable for the following:    Chloride 98 (*)     Creatinine, Ser 1.32 (*)    GFR calc non Af Amer 56 (*)    All other components within normal limits  BRAIN NATRIURETIC PEPTIDE - Abnormal; Notable for the following:    B Natriuretic Peptide 225.1 (*)    All other components within normal limits  URINALYSIS, ROUTINE W REFLEX MICROSCOPIC (NOT AT [facility]) - Abnormal; Notable for the following:    Color, Urine ORANGE (*)    APPearance CLOUDY (*)    Bilirubin Urine MODERATE (*)    Ketones, ur >80 (*)    Protein, ur 100 (*)    Leukocytes, UA TRACE (*)    All other components within normal limits  URINE MICROSCOPIC-ADD ON - Abnormal; Notable for the following:    Squamous Epithelial / LPF 0-5 (*)    Bacteria, UA FEW (*)    Casts HYALINE CASTS (*)    All other components within normal limits  BASIC METABOLIC PANEL - Abnormal; Notable for the following:    Chloride 98 (*)    All other components within normal limits  CBG MONITORING, ED - Abnormal; Notable for the following:    Glucose-Capillary 109 (*)    All other components within normal limits  PROTIME-INR    Imaging Review Dg Chest 2 View  10/23/2015  CLINICAL DATA:  Chest pain and lower extremity edema EXAM: CHEST  2 VIEW COMPARISON:  Sep 08, 2015 FINDINGS: There is no edema or consolidation. Heart is upper normal in size with pulmonary vascularity within normal limits. No adenopathy. There is degenerative change in the thoracic spine. IMPRESSION: No edema or consolidation. Electronically Signed   By: Lowella Grip III M.D.   On: 10/23/2015 08:59   Dg Foot Complete Left  10/23/2015  CLINICAL DATA:  Pain and swelling, progressing EXAM: LEFT FOOT - COMPLETE 3+ VIEW COMPARISON:  None. FINDINGS: Frontal, oblique, and lateral views were obtained. There is extensive soft tissue swelling dorsally. There is no acute fracture or dislocation. There is evidence of old healed fracture of the distal fourth metatarsal. There is moderate osteoarthritic change in the first MTP joint. There is  osteoarthritic change in all PIP and DIP joints. No erosive change. There is extensive calcification in the posterior tibial artery distally. There is a benign exostosis along the dorsal distal talus. There is mild calcification in the distal Achilles tendon. IMPRESSION: Extensive  soft tissue swelling over the dorsum of the foot. Multilevel osteoarthritic change. No acute fracture or dislocation. Arterial vascular calcification in the posterior tibial artery noted. Distal Achilles tendinosis. Electronically Signed   By: Lowella Grip III M.D.   On: 10/23/2015 08:58   Dg Foot Complete Right  10/23/2015  CLINICAL DATA:  Pain and swelling for 2 months, progressing EXAM: RIGHT FOOT COMPLETE - 3+ VIEW COMPARISON:  None. FINDINGS: Frontal, oblique, and lateral views were obtained. There is no acute fracture or dislocation. There is mild soft tissue swelling. There is osteoarthritic change in the first MTP joint as well as in all DIP joints. No erosive change. There are multiple phleboliths adjacent to the distal tibia. There is a degree of pes planus. There is a small benign exostosis arising from the dorsal distal talus. IMPRESSION: Osteoarthritic change in multiple distal joints. No fracture or dislocation. No erosive change. Pes planus. Mild generalized soft tissue swelling. Electronically Signed   By: Lowella Grip III M.D.   On: 10/23/2015 08:57   Dg Hip Unilat With Pelvis 2-3 Views Right  10/23/2015  CLINICAL DATA:  Pain and swelling for 2 months EXAM: DG HIP (WITH OR WITHOUT PELVIS) 2-3V RIGHT COMPARISON:  Sep 07, 2015 FINDINGS: Frontal pelvis as well as frontal and lateral right hip images were obtained. There is a total hip prosthesis on the right with prosthetic components appearing well-seated and intact. There is evidence of old trauma in the pubic symphysis region with bony overgrowth. There is myositis ossifications surrounding the right ischium, stable. No acute fracture or dislocation. There is  moderate narrowing of the left hip joint. There is degenerative change in the lower lumbar spine with lower lumbar levoscoliosis. IMPRESSION: Total hip prosthesis on the right with prosthetic components appearing well-seated. Evidence of old trauma in the pubic symphysis region. Myositis ossificans in the region of the right ischium, stable moderate osteoarthritic change left hip joint. Electronically Signed   By: Lowella Grip III M.D.   On: 10/23/2015 08:56   I have personally reviewed and evaluated these images and lab results as part of my medical decision-making.   EKG Interpretation   Date/Time:  Wednesday October 23 2015 13:49:49 EDT Ventricular Rate:  83 PR Interval:    QRS Duration: 102 QT Interval:  390 QTC Calculation: 459 R Axis:   44 Text Interpretation:  Atrial fibrillation No significant change was found  Confirmed by Wyvonnia Dusky  MD, Karlynn Furrow [phone]) on 10/23/2015 2:01:16 PM Also  confirmed by Wyvonnia Dusky  MD, Jawana Reagor [phone]), editor Gilford Rile, CCT, [place]  ([id number])  on 10/23/2015 3:22:35 PM      MDM   Final diagnoses:  Thigh hematoma, right, initial encounter  Thrombocytopenia ([place])    Patient with swelling near right hip incision for the past 2 months. Discomfort in right hip as well and pressure in his left foot when he tries to walk. No chest pain or shortness of breath.  R hip prosthesis well seated on xray.  L foot Xray with STS.  D/w Thedore Mins, Dr. Damita Dunnings PA.  Swelling not expected after revision. Recommends CT of hip.  Dopplers negative for DVT bilaterally. Labs show worsening thrombocytopenia of 64. This was attributed previously to alcohol abuse. Hemoglobin 12, improved from previous.  Large hematoma on CT as above. D/w Dr. Mayer Camel. He recommends compression with ACE wrap and he will see in office tomorrow.  Urinalysis shows large ketones. CBG 109. Anion gap is normal. Suspect patient is intervascular dry despite his leg edema.  Creatinine is increased from baseline,  hemoglobin is increased from baseline.hydration given gently. Not in DKA.  Patient able to ambulate.  Compression on hematoma done. Followup with Dr. Mayer Camel tomorrow. Follow up with PCP This week to recheck Cr. Return precautions discussed.  Ezequiel Essex, MD 10/23/15 2146

## 2015-10-23 NOTE — ED Notes (Signed)
Pt oob ands walking in hallway with waLKER BUT REQUIRES NO ASSIST.

## 2015-10-23 NOTE — ED Notes (Signed)
Pt states he understands instructions and follow up. Home stable with family as ride.

## 2015-10-23 NOTE — ED Notes (Signed)
Patient transported to CT 

## 2015-10-23 NOTE — ED Notes (Signed)
Pt oob to bsc

## 2015-10-23 NOTE — Progress Notes (Signed)
VASCULAR LAB PRELIMINARY  PRELIMINARY  PRELIMINARY  PRELIMINARY  Bilateral lower extremity venous duplex completed.    Preliminary report:  Bilateral:  No evidence of DVT, superficial thrombosis, or Baker's Cyst.   Adoni Greenough, RVS 10/23/2015, 9:34 AM

## 2015-10-23 NOTE — Discharge Instructions (Signed)
Hematoma Follow up with Dr. Mayer Camel tomorrow. See your PCP this week for a recheck of your kidney function. Return to the ED if you develop chest pain, shortness of breath or any other concerns. A hematoma is a collection of blood under the skin, in an organ, in a body space, in a joint space, or in other tissue. The blood can clot to form a lump that you can see and feel. The lump is often firm and may sometimes become sore and tender. Most hematomas get better in a few days to weeks. However, some hematomas may be serious and require medical care. Hematomas can range in size from very small to very large. CAUSES  A hematoma can be caused by a blunt or penetrating injury. It can also be caused by spontaneous leakage from a blood vessel under the skin. Spontaneous leakage from a blood vessel is more likely to occur in older people, especially those taking blood thinners. Sometimes, a hematoma can develop after certain medical procedures. SIGNS AND SYMPTOMS   A firm lump on the body.  Possible pain and tenderness in the area.  Bruising.Blue, dark blue, purple-red, or yellowish skin may appear at the site of the hematoma if the hematoma is close to the surface of the skin. For hematomas in deeper tissues or body spaces, the signs and symptoms may be subtle. For example, an intra-abdominal hematoma may cause abdominal pain, weakness, fainting, and shortness of breath. An intracranial hematoma may cause a headache or symptoms such as weakness, trouble speaking, or a change in consciousness. DIAGNOSIS  A hematoma can usually be diagnosed based on your medical history and a physical exam. Imaging tests may be needed if your health care provider suspects a hematoma in deeper tissues or body spaces, such as the abdomen, head, or chest. These tests may include ultrasonography or a CT scan.  TREATMENT  Hematomas usually go away on their own over time. Rarely does the blood need to be drained out of the body.  Large hematomas or those that may affect vital organs will sometimes need surgical drainage or monitoring. HOME CARE INSTRUCTIONS   Apply ice to the injured area:   Put ice in a plastic bag.   Place a towel between your skin and the bag.   Leave the ice on for 20 minutes, 2-3 times a day for the first 1 to 2 days.   After the first 2 days, switch to using warm compresses on the hematoma.   Elevate the injured area to help decrease pain and swelling. Wrapping the area with an elastic bandage may also be helpful. Compression helps to reduce swelling and promotes shrinking of the hematoma. Make sure the bandage is not wrapped too tight.   If your hematoma is on a lower extremity and is painful, crutches may be helpful for a couple days.   Only take over-the-counter or prescription medicines as directed by your health care provider. SEEK IMMEDIATE MEDICAL CARE IF:   You have increasing pain, or your pain is not controlled with medicine.   You have a fever.   You have worsening swelling or discoloration.   Your skin over the hematoma breaks or starts bleeding.   Your hematoma is in your chest or abdomen and you have weakness, shortness of breath, or a change in consciousness.  Your hematoma is on your scalp (caused by a fall or injury) and you have a worsening headache or a change in alertness or consciousness. MAKE SURE YOU:  Understand these instructions.  Will watch your condition.  Will get help right away if you are not doing well or get worse.   This information is not intended to replace advice given to you by your health care provider. Make sure you discuss any questions you have with your health care provider.   Document Released: 11/19/2003 Document Revised: 12/07/2012 Document Reviewed: 09/14/2012 Elsevier Interactive Patient Education [employer].

## 2015-10-23 NOTE — ED Notes (Signed)
Pt ambulated with assistance of a walker. Pt stated he felt fine and had no pain.

## 2015-10-24 ENCOUNTER — Other Ambulatory Visit: Payer: Commercial Managed Care - HMO

## 2015-10-24 ENCOUNTER — Ambulatory Visit: Payer: Commercial Managed Care - HMO | Admitting: Cardiology

## 2015-11-08 ENCOUNTER — Other Ambulatory Visit: Payer: Self-pay | Admitting: *Deleted

## 2015-11-08 MED ORDER — LOSARTAN POTASSIUM 25 MG PO TABS: 12.5000 mg | ORAL_TABLET | Freq: Every day | ORAL | Status: DC

## 2015-11-13 ENCOUNTER — Telehealth: Payer: Self-pay

## 2015-11-14 NOTE — Telephone Encounter (Signed)
Pt calling again regarding refill of Lasix. Please advise

## 2015-11-19 NOTE — Telephone Encounter (Signed)
Informed patient that since Dr. Sharlet Salina changed the medication frequency, she will need to call in refills. He was grateful for call.

## 2015-11-20 ENCOUNTER — Other Ambulatory Visit: Payer: Self-pay | Admitting: *Deleted

## 2015-11-20 MED ORDER — FUROSEMIDE 40 MG PO TABS: 40.0000 mg | ORAL_TABLET | Freq: Three times a day (TID) | ORAL | 2 refills | Status: DC

## 2015-11-20 NOTE — Telephone Encounter (Signed)
Rec'd call pt states he is needing refill on his water pill. Verified med sending his furosemide to [facility]...Thomas [name]

## 2015-12-25 ENCOUNTER — Other Ambulatory Visit: Payer: Self-pay | Admitting: *Deleted

## 2015-12-25 MED ORDER — FUROSEMIDE 40 MG PO TABS: 40.0000 mg | ORAL_TABLET | Freq: Three times a day (TID) | ORAL | 3 refills | Status: DC

## 2015-12-25 NOTE — Telephone Encounter (Signed)
Rec'd call pt requesting refill on his Lasix. Verified pharmacy inform will send to [facility]...Thomas [name]

## 2016-01-24 ENCOUNTER — Telehealth: Payer: Self-pay

## 2016-01-24 MED ORDER — FUROSEMIDE 40 MG PO TABS: 40.0000 mg | ORAL_TABLET | Freq: Three times a day (TID) | ORAL | 0 refills | Status: DC

## 2016-01-24 MED ORDER — CARVEDILOL 12.5 MG PO TABS: 6.2500 mg | ORAL_TABLET | Freq: Two times a day (BID) | ORAL | 0 refills | Status: DC

## 2016-01-24 NOTE — Telephone Encounter (Signed)
Pt called and rq rf of carvedilol. Informed that we do not rx. Pt stated that he is out and also stated that he was taking differently then the sig we have in the emr. Sent a short erx to pof to give pt a chance to contact cardiology.   erx for lasix sent.

## 2016-04-08 ENCOUNTER — Other Ambulatory Visit: Payer: Self-pay | Admitting: *Deleted

## 2016-04-08 MED ORDER — CARVEDILOL 12.5 MG PO TABS: 6.2500 mg | ORAL_TABLET | Freq: Two times a day (BID) | ORAL | 0 refills | Status: DC

## 2016-04-08 MED ORDER — FUROSEMIDE 40 MG PO TABS: 40.0000 mg | ORAL_TABLET | Freq: Three times a day (TID) | ORAL | 0 refills | Status: DC

## 2016-08-24 ENCOUNTER — Encounter: Payer: Self-pay | Admitting: Internal Medicine

## 2016-08-24 ENCOUNTER — Ambulatory Visit (INDEPENDENT_AMBULATORY_CARE_PROVIDER_SITE_OTHER): Payer: Commercial Managed Care - HMO | Admitting: Internal Medicine

## 2016-08-24 DIAGNOSIS — K709 Alcoholic liver disease, unspecified: Secondary | ICD-10-CM

## 2016-08-24 DIAGNOSIS — I10 Essential (primary) hypertension: Secondary | ICD-10-CM | POA: Diagnosis not present

## 2016-08-24 DIAGNOSIS — I5023 Acute on chronic systolic (congestive) heart failure: Secondary | ICD-10-CM | POA: Diagnosis not present

## 2016-08-24 DIAGNOSIS — Z9114 Patient's other noncompliance with medication regimen: Secondary | ICD-10-CM

## 2016-08-24 MED ORDER — LOSARTAN POTASSIUM 25 MG PO TABS: 12.5000 mg | ORAL_TABLET | Freq: Every day | ORAL | 3 refills | Status: DC

## 2016-08-24 MED ORDER — FUROSEMIDE 40 MG PO TABS: 40.0000 mg | ORAL_TABLET | Freq: Three times a day (TID) | ORAL | 6 refills | Status: DC

## 2016-08-24 MED ORDER — POTASSIUM CHLORIDE CRYS ER 20 MEQ PO TBCR: 40.0000 meq | EXTENDED_RELEASE_TABLET | Freq: Every day | ORAL | 6 refills | Status: DC

## 2016-08-24 MED ORDER — CARVEDILOL 12.5 MG PO TABS: 6.2500 mg | ORAL_TABLET | Freq: Two times a day (BID) | ORAL | 6 refills | Status: DC
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
## 2016-08-24 NOTE — Patient Instructions (Signed)
We have sent in the refills today and will see you back in about 1 month to make sure the fluid is getting better.

## 2016-08-24 NOTE — Assessment & Plan Note (Signed)
BP is at goal although he is off several meds. Taking losartan 25 mg daily and refilled his coreg and lasix today. Declines labs although we have asked to check CMP for kidney function and electrolytes. He is requested to come back in 1 month and he agrees to labs with next visit.

## 2016-08-24 NOTE — Progress Notes (Signed)
Pre visit review using our clinic review tool, if applicable. No additional management support is needed unless otherwise documented below in the visit note. 
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
## 2016-11-20 ENCOUNTER — Inpatient Hospital Stay (HOSPITAL_COMMUNITY)
Admission: EM | Admit: 2016-11-20 | Discharge: 2016-11-24 | DRG: 309 | Disposition: A | Discharge status: Home Health | Payer: 59 | Attending: Nephrology | Admitting: Nephrology

## 2016-11-20 ENCOUNTER — Emergency Department (HOSPITAL_COMMUNITY): Payer: 59

## 2016-11-20 ENCOUNTER — Encounter (HOSPITAL_COMMUNITY): Payer: Self-pay

## 2016-11-20 DIAGNOSIS — E860 Dehydration: Secondary | ICD-10-CM | POA: Diagnosis present

## 2016-11-20 DIAGNOSIS — Z9119 Patient's noncompliance with other medical treatment and regimen: Secondary | ICD-10-CM | POA: Diagnosis not present

## 2016-11-20 DIAGNOSIS — I420 Dilated cardiomyopathy: Secondary | ICD-10-CM | POA: Diagnosis present

## 2016-11-20 DIAGNOSIS — Z6841 Body Mass Index (BMI) 40.0 and over, adult: Secondary | ICD-10-CM

## 2016-11-20 DIAGNOSIS — K5730 Diverticulosis of large intestine without perforation or abscess without bleeding: Secondary | ICD-10-CM | POA: Diagnosis present

## 2016-11-20 DIAGNOSIS — D61818 Other pancytopenia: Secondary | ICD-10-CM | POA: Diagnosis present

## 2016-11-20 DIAGNOSIS — I481 Persistent atrial fibrillation: Secondary | ICD-10-CM | POA: Diagnosis present

## 2016-11-20 DIAGNOSIS — Z8249 Family history of ischemic heart disease and other diseases of the circulatory system: Secondary | ICD-10-CM | POA: Diagnosis not present

## 2016-11-20 DIAGNOSIS — K3189 Other diseases of stomach and duodenum: Secondary | ICD-10-CM | POA: Diagnosis present

## 2016-11-20 DIAGNOSIS — I130 Hypertensive heart and chronic kidney disease with heart failure and stage 1 through stage 4 chronic kidney disease, or unspecified chronic kidney disease: Secondary | ICD-10-CM | POA: Diagnosis present

## 2016-11-20 DIAGNOSIS — R4701 Aphasia: Secondary | ICD-10-CM | POA: Diagnosis present

## 2016-11-20 DIAGNOSIS — N179 Acute kidney failure, unspecified: Secondary | ICD-10-CM

## 2016-11-20 DIAGNOSIS — Z86010 Personal history of colonic polyps: Secondary | ICD-10-CM

## 2016-11-20 DIAGNOSIS — I5042 Chronic combined systolic (congestive) and diastolic (congestive) heart failure: Secondary | ICD-10-CM | POA: Diagnosis present

## 2016-11-20 DIAGNOSIS — I482 Chronic atrial fibrillation: Secondary | ICD-10-CM | POA: Diagnosis present

## 2016-11-20 DIAGNOSIS — R531 Weakness: Secondary | ICD-10-CM | POA: Diagnosis present

## 2016-11-20 DIAGNOSIS — I4891 Unspecified atrial fibrillation: Secondary | ICD-10-CM | POA: Diagnosis present

## 2016-11-20 DIAGNOSIS — R000 Tachycardia, unspecified: Secondary | ICD-10-CM | POA: Diagnosis not present

## 2016-11-20 DIAGNOSIS — I5043 Acute on chronic combined systolic (congestive) and diastolic (congestive) heart failure: Secondary | ICD-10-CM | POA: Diagnosis not present

## 2016-11-20 DIAGNOSIS — Z96641 Presence of right artificial hip joint: Secondary | ICD-10-CM | POA: Diagnosis present

## 2016-11-20 DIAGNOSIS — I6529 Occlusion and stenosis of unspecified carotid artery: Secondary | ICD-10-CM | POA: Diagnosis present

## 2016-11-20 DIAGNOSIS — R4781 Slurred speech: Secondary | ICD-10-CM | POA: Diagnosis not present

## 2016-11-20 DIAGNOSIS — D696 Thrombocytopenia, unspecified: Secondary | ICD-10-CM | POA: Diagnosis present

## 2016-11-20 DIAGNOSIS — K766 Portal hypertension: Secondary | ICD-10-CM | POA: Diagnosis present

## 2016-11-20 DIAGNOSIS — Z7982 Long term (current) use of aspirin: Secondary | ICD-10-CM | POA: Diagnosis not present

## 2016-11-20 DIAGNOSIS — I959 Hypotension, unspecified: Secondary | ICD-10-CM | POA: Diagnosis present

## 2016-11-20 DIAGNOSIS — M1990 Unspecified osteoarthritis, unspecified site: Secondary | ICD-10-CM | POA: Diagnosis present

## 2016-11-20 DIAGNOSIS — I10 Essential (primary) hypertension: Secondary | ICD-10-CM | POA: Diagnosis not present

## 2016-11-20 DIAGNOSIS — I361 Nonrheumatic tricuspid (valve) insufficiency: Secondary | ICD-10-CM | POA: Diagnosis not present

## 2016-11-20 DIAGNOSIS — E669 Obesity, unspecified: Secondary | ICD-10-CM | POA: Diagnosis present

## 2016-11-20 DIAGNOSIS — K709 Alcoholic liver disease, unspecified: Secondary | ICD-10-CM | POA: Diagnosis present

## 2016-11-20 DIAGNOSIS — N183 Chronic kidney disease, stage 3 (moderate): Secondary | ICD-10-CM | POA: Diagnosis present

## 2016-11-20 DIAGNOSIS — E871 Hypo-osmolality and hyponatremia: Secondary | ICD-10-CM | POA: Diagnosis present

## 2016-11-20 DIAGNOSIS — Z9181 History of falling: Secondary | ICD-10-CM

## 2016-11-20 DIAGNOSIS — I480 Paroxysmal atrial fibrillation: Secondary | ICD-10-CM | POA: Diagnosis present

## 2016-11-20 DIAGNOSIS — Z9114 Patient's other noncompliance with medication regimen: Secondary | ICD-10-CM

## 2016-11-20 LAB — COMPREHENSIVE METABOLIC PANEL
ALBUMIN: 3.5 g/dL (ref 3.5–5.0)
ALK PHOS: 90 U/L (ref 38–126)
ALT: 99 U/L — AB (ref 17–63)
ANION GAP: 19 — AB (ref 5–15)
AST: 219 U/L — AB (ref 15–41)
BILIRUBIN TOTAL: 5.5 mg/dL — AB (ref 0.3–1.2)
BUN: 11 mg/dL (ref 6–20)
CALCIUM: 9.2 mg/dL (ref 8.9–10.3)
CHLORIDE: 89 mmol/L — AB (ref 101–111)
CO2: 19 mmol/L — AB (ref 22–32)
Creatinine, Ser: 2 mg/dL — ABNORMAL HIGH (ref 0.61–1.24)
GFR calc non Af Amer: 34 mL/min — ABNORMAL LOW (ref >60)
GFR, EST AFRICAN AMERICAN: 39 mL/min — AB (ref >60)
GLUCOSE: 132 mg/dL — AB (ref 65–99)
POTASSIUM: 5 mmol/L (ref 3.5–5.1)
SODIUM: 127 mmol/L — AB (ref 135–145)
Total Protein: 6.9 g/dL (ref 6.5–8.1)

## 2016-11-20 LAB — I-STAT CHEM 8, ED
BUN: 14 mg/dL (ref 6–20)
CHLORIDE: 88 mmol/L — AB (ref 101–111)
Calcium, Ion: 1 mmol/L — ABNORMAL LOW (ref 1.15–1.40)
Creatinine, Ser: 1.8 mg/dL — ABNORMAL HIGH (ref 0.61–1.24)
GLUCOSE: 125 mg/dL — AB (ref 65–99)
HCT: 39 % (ref 39.0–52.0)
Hemoglobin: 13.3 g/dL (ref 13.0–17.0)
POTASSIUM: 4.8 mmol/L (ref 3.5–5.1)
Sodium: 126 mmol/L — ABNORMAL LOW (ref 135–145)
TCO2: 25 mmol/L (ref 0–100)

## 2016-11-20 LAB — PROTIME-INR
INR: 1
PROTHROMBIN TIME: 13.2 s (ref 11.4–15.2)

## 2016-11-20 LAB — CBC WITH DIFFERENTIAL/PLATELET
BASOS PCT: 0 %
Basophils Absolute: 0 10*3/uL (ref 0.0–0.1)
Eosinophils Absolute: 0 10*3/uL (ref 0.0–0.7)
Eosinophils Relative: 0 %
HEMATOCRIT: 36.6 % — AB (ref 39.0–52.0)
HEMOGLOBIN: 12.8 g/dL — AB (ref 13.0–17.0)
LYMPHS ABS: 0.4 10*3/uL — AB (ref 0.7–4.0)
LYMPHS PCT: 6 %
MCH: 37.4 pg — ABNORMAL HIGH (ref 26.0–34.0)
MCHC: 35 g/dL (ref 30.0–36.0)
MCV: 107 fL — AB (ref 78.0–100.0)
MONOS PCT: 12 %
Monocytes Absolute: 0.7 10*3/uL (ref 0.1–1.0)
NEUTROS ABS: 4.6 10*3/uL (ref 1.7–7.7)
NEUTROS PCT: 81 %
Platelets: 83 10*3/uL — ABNORMAL LOW (ref 150–400)
RBC: 3.42 MIL/uL — ABNORMAL LOW (ref 4.22–5.81)
RDW: 14.1 % (ref 11.5–15.5)
WBC: 5.7 10*3/uL (ref 4.0–10.5)

## 2016-11-20 LAB — I-STAT TROPONIN, ED
TROPONIN I, POC: 0.02 ng/mL (ref 0.00–0.08)
Troponin i, poc: 0.02 ng/mL (ref 0.00–0.08)

## 2016-11-20 LAB — D-DIMER, QUANTITATIVE: D-Dimer, Quant: 1.28 ug/mL-FEU — ABNORMAL HIGH (ref 0.00–0.50)

## 2016-11-20 MED ORDER — ADULT MULTIVITAMIN W/MINERALS CH
1.0000 | ORAL_TABLET | Freq: Every day | ORAL | Status: DC
  Administered 2016-11-21 – 2016-11-24 (×4): 1 via ORAL
  Filled 2016-11-20 (×4): qty 1

## 2016-11-20 MED ORDER — TECHNETIUM TC 99M DIETHYLENETRIAME-PENTAACETIC ACID: 30.0000 | Freq: Once | INTRAVENOUS | Status: DC | PRN

## 2016-11-20 MED ORDER — VITAMIN B-1 100 MG PO TABS
100.0000 mg | ORAL_TABLET | Freq: Every day | ORAL | Status: DC
  Administered 2016-11-21 – 2016-11-24 (×4): 100 mg via ORAL
  Filled 2016-11-20 (×4): qty 1

## 2016-11-20 MED ORDER — ENSURE ENLIVE PO LIQD: 237.0000 mL | Freq: Two times a day (BID) | ORAL | Status: DC

## 2016-11-20 MED ORDER — METOPROLOL TARTRATE 5 MG/5ML IV SOLN
2.5000 mg | INTRAVENOUS | Status: DC | PRN
  Administered 2016-11-20 – 2016-11-21 (×2): 2.5 mg via INTRAVENOUS
  Filled 2016-11-20 (×2): qty 5

## 2016-11-20 MED ORDER — SODIUM CHLORIDE 0.9 % IV BOLUS (SEPSIS)
1000.0000 mL | Freq: Once | INTRAVENOUS | Status: AC
  Administered 2016-11-20: 1000 mL via INTRAVENOUS

## 2016-11-20 MED ORDER — ONDANSETRON HCL 4 MG PO TABS: 4.0000 mg | ORAL_TABLET | Freq: Four times a day (QID) | ORAL | Status: DC | PRN

## 2016-11-20 MED ORDER — CARVEDILOL 12.5 MG PO TABS
12.5000 mg | ORAL_TABLET | Freq: Two times a day (BID) | ORAL | Status: DC
  Administered 2016-11-21: 12.5 mg via ORAL
  Filled 2016-11-20: qty 1

## 2016-11-20 MED ORDER — HEPARIN BOLUS VIA INFUSION
4000.0000 [IU] | Freq: Once | INTRAVENOUS | Status: AC
  Administered 2016-11-20: 4000 [IU] via INTRAVENOUS
  Filled 2016-11-20: qty 4000

## 2016-11-20 MED ORDER — ZOLPIDEM TARTRATE 5 MG PO TABS
5.0000 mg | ORAL_TABLET | Freq: Every evening | ORAL | Status: DC | PRN
  Administered 2016-11-20: 5 mg via ORAL
  Filled 2016-11-20 (×2): qty 1

## 2016-11-20 MED ORDER — ACETAMINOPHEN 650 MG RE SUPP: 650.0000 mg | Freq: Four times a day (QID) | RECTAL | Status: DC | PRN

## 2016-11-20 MED ORDER — HYDROCODONE-ACETAMINOPHEN 5-325 MG PO TABS: 1.0000 | ORAL_TABLET | ORAL | Status: DC | PRN

## 2016-11-20 MED ORDER — FUROSEMIDE 40 MG PO TABS
40.0000 mg | ORAL_TABLET | Freq: Two times a day (BID) | ORAL | Status: DC
  Administered 2016-11-21: 40 mg via ORAL
  Filled 2016-11-20: qty 1

## 2016-11-20 MED ORDER — ASPIRIN EC 81 MG PO TBEC
81.0000 mg | DELAYED_RELEASE_TABLET | Freq: Two times a day (BID) | ORAL | Status: DC
  Administered 2016-11-20 – 2016-11-24 (×8): 81 mg via ORAL
  Filled 2016-11-20 (×8): qty 1

## 2016-11-20 MED ORDER — ONDANSETRON HCL 4 MG/2ML IJ SOLN: 4.0000 mg | Freq: Four times a day (QID) | INTRAMUSCULAR | Status: DC | PRN

## 2016-11-20 MED ORDER — METHOCARBAMOL 500 MG PO TABS
500.0000 mg | ORAL_TABLET | Freq: Two times a day (BID) | ORAL | Status: DC
  Administered 2016-11-21 – 2016-11-24 (×8): 500 mg via ORAL
  Filled 2016-11-20 (×8): qty 1

## 2016-11-20 MED ORDER — FOLIC ACID 1 MG PO TABS
1.0000 mg | ORAL_TABLET | Freq: Every day | ORAL | Status: DC
  Administered 2016-11-21 – 2016-11-24 (×4): 1 mg via ORAL
  Filled 2016-11-20 (×4): qty 1

## 2016-11-20 MED ORDER — METOPROLOL TARTRATE 5 MG/5ML IV SOLN
2.5000 mg | Freq: Once | INTRAVENOUS | Status: AC
  Administered 2016-11-20: 2.5 mg via INTRAVENOUS
  Filled 2016-11-20: qty 5

## 2016-11-20 MED ORDER — SODIUM CHLORIDE 0.9 % IV SOLN
INTRAVENOUS | Status: DC
  Administered 2016-11-20: 50 mL/h via INTRAVENOUS

## 2016-11-20 MED ORDER — LIDOCAINE 5 % EX PTCH
1.0000 | MEDICATED_PATCH | Freq: Two times a day (BID) | CUTANEOUS | Status: DC | PRN
Start: 2016-11-20 — End: 2016-11-24

## 2016-11-20 MED ORDER — TECHNETIUM TO 99M ALBUMIN AGGREGATED
4.0000 | Freq: Once | INTRAVENOUS | Status: AC | PRN
  Administered 2016-11-20: 4 via INTRAVENOUS

## 2016-11-20 MED ORDER — ACETAMINOPHEN 325 MG PO TABS: 650.0000 mg | ORAL_TABLET | Freq: Four times a day (QID) | ORAL | Status: DC | PRN

## 2016-11-20 MED ORDER — HEPARIN (PORCINE) IN NACL 100-0.45 UNIT/ML-% IJ SOLN
1700.0000 [IU]/h | INTRAMUSCULAR | Status: DC
  Administered 2016-11-20: 1400 [IU]/h via INTRAVENOUS
  Administered 2016-11-21: 1700 [IU]/h via INTRAVENOUS
  Filled 2016-11-20 (×2): qty 250

## 2016-11-20 MED ORDER — SODIUM CHLORIDE 0.9% FLUSH
3.0000 mL | Freq: Two times a day (BID) | INTRAVENOUS | Status: DC
  Administered 2016-11-20 – 2016-11-24 (×6): 3 mL via INTRAVENOUS

## 2016-11-20 NOTE — ED Notes (Signed)
Pt transported to VQ scan. 

## 2016-11-20 NOTE — Progress Notes (Signed)
ANTICOAGULATION CONSULT NOTE - Initial Consult  Pharmacy Consult for heparin Indication: atrial fibrillation  No Known Allergies  Patient Measurements:   Heparin Dosing Weight: 104 kg  Vital Signs: Temp: 99 F (37.2 C) (08/03 1851) Temp Source: Oral (08/03 1851) BP: 104/78 (08/03 2130) Pulse Rate: 121 (08/03 2130)  Labs:  Recent Labs  11/20/16 1140 11/20/16 1207  HGB 12.8* 13.3  HCT 36.6* 39.0  PLT 83*  --   LABPROT 13.2  --   INR 1.00  --   CREATININE 2.00* 1.80*    CrCl cannot be calculated (Unknown ideal weight.).  Assessment: CC/HPI: 66 yo m presenting with 2 days of weakness, slurred speech and falls  PMH:  CHF, afib, etoh liver disease   Anticoag: no AC pta to hep gtt  Renal: SCr 1.8  Heme/Onc: H&H 13.3/39, Plt 83  Goal of Therapy:  Heparin level 0.3-0.7 units/ml Monitor platelets by anticoagulation protocol: Yes   Plan:  Heparin 4000 unit bolus Heparin 1400 units/hr Initial lvl with am labs, cbc F/u long-term AC  Levester Fresh, PharmD, BCPS, BCCCP Clinical Pharmacist Clinical phone for 11/20/2016 from 7a-3:30p: [phone] If after 3:30p, please call main pharmacy at: [phone] 11/20/2016 10:31 PM

## 2016-11-20 NOTE — ED Notes (Signed)
Pt transported to vq scan 

## 2016-11-20 NOTE — ED Provider Notes (Signed)
[facility] DEPT Provider Note   CSN: [id number] Arrival date & time: 11/20/16  1124     History   Chief Complaint Chief Complaint  Patient presents with  . Aphasia    HPI Thomas [name] is a 66 y.o. male.  He presents by EMS, for multiple problems including slurred speech, rapid heartbeat, recent falling and diarrhea.  EMS report they were at his home 2 days ago to help him back in bed after a fall.  He reports his legs are weak, since hip fracture surgery, 1-1/2 years ago.  He denies headache, chest pain, nausea, vomiting, paresthesia or focal weakness.  He is taking his usual medications.  There are no other known modifying factors.  HPI  Past Medical History:  Diagnosis Date  . Alcoholic liver disease ([place])   . Arthritis   . Chronic atrial fibrillation ([name])    no an anticoagulation canditate due to alcohol abuse, liver cirrhosis with pancytopenia, medical noncompliance  . Chronic systolic CHF (congestive heart failure), NYHA class 2 ([place])   . DCM (dilated cardiomyopathy) ([place])    EF 30% but now improved to 40-45% by echo 2016 on medical therapy with moderate MR  . Diverticulosis of colon   . Edema extremities 07/02/2015  . ETOH abuse   . Gastroduodenitis    H Pylori positive  . Helicobacter pylori gastritis 10/19/2013  . Hypertension   . Obesity   . Pancytopenia ([place])   . Personal history of colonic polyps - adenomas 08/21/2013  . Portal hypertensive gastropathy [facility])     Patient Active Problem List   Diagnosis Date Noted  . Recurrent dislocation of hip joint prosthesis ([place]) 08/12/2015  . Edema extremities 07/02/2015  . Primary osteoarthritis of right hip 05/18/2015  . Non compliance w medication regimen 02/25/2015  . Lead toxicity 10/19/2013  . Hypertension   . Personal history of colonic polyps - adenomas 08/21/2013  . Portal hypertensive gastropathy ([place]) 08/21/2013  . DCM (dilated cardiomyopathy) ([place]) 08/19/2013  . Acute on chronic systolic (congestive)  heart failure ([place]) 08/19/2013  . Alcoholic liver disease ([place]) 08/18/2013  . Chronic atrial fibrillation ([place]) 08/17/2013  . Pancytopenia ([place]) 08/17/2013  . ETOH abuse 08/17/2013    Past Surgical History:  Procedure Laterality Date  . COLONOSCOPY N/A 08/21/2013   Procedure: COLONOSCOPY;  Surgeon: Jerene Bears, MD;  Location: [facility] ENDOSCOPY;  Service: Endoscopy;  Laterality: N/A;  . ESOPHAGOGASTRODUODENOSCOPY N/A 08/21/2013   Procedure: ESOPHAGOGASTRODUODENOSCOPY (EGD);  Surgeon: Jerene Bears, MD;  Location: [facility] ENDOSCOPY;  Service: Endoscopy;  Laterality: N/A;  . HIP CLOSED REDUCTION Right 08/13/2015   Procedure: CLOSED REDUCTION HIP;  Surgeon: [name], MD;  Location: [facility] NEURO ORS;  Service: Orthopedics;  Laterality: Right;  . TOTAL HIP ARTHROPLASTY Right 05/20/2015   Procedure: TOTAL HIP ARTHROPLASTY;  Surgeon: Frederik Pear, MD;  Location: [facility];  Service: Orthopedics;  Laterality: Right;  . TOTAL HIP REVISION Right 09/09/2015   Procedure: TOTAL HIP REVISION;  Surgeon: Frederik Pear, MD;  Location: [facility];  Service: Orthopedics;  Laterality: Right;       Home Medications    Prior to Admission medications   Medication Sig Start Date End Date Taking? Authorizing Provider  aspirin EC 325 MG tablet Take 1 tablet (325 mg total) by mouth 2 (two) times daily. 09/09/15  Yes Joanell Rising K, PA-C  carvedilol (COREG) 12.5 MG tablet Take 0.5 tablets (6.25 mg total) by mouth 2 (two) times daily with a meal. Need to make follow-up appt for  future refills Patient taking differently: Take 12.5 mg by mouth 2 (two) times daily with a meal. Need to make follow-up appt for future refills 08/24/16  Yes Hoyt Koch, MD  folic acid (FOLVITE) 1 MG tablet Take 1 tablet (1 mg total) by mouth daily. 07/31/15  Yes Hoyt Koch, MD  furosemide (LASIX) 40 MG tablet Take 1 tablet (40 mg total) by mouth 3 (three) times daily. 08/24/16  Yes Hoyt Koch, MD  lidocaine (LIDODERM) 5 % Place 1 patch  onto the skin 2 (two) times daily as needed. Remove & Discard patch within 12 hours or as directed by MD 09/02/15  Yes Hoyt Koch, MD  losartan (COZAAR) 25 MG tablet Take 0.5 tablets (12.5 mg total) by mouth daily. 08/24/16  Yes Hoyt Koch, MD  methocarbamol (ROBAXIN) 500 MG tablet Take 1 tablet (500 mg total) by mouth 2 (two) times daily with a meal. 05/20/15  Yes Leighton Parody, PA-C  Multiple Vitamin (MULTIVITAMIN WITH MINERALS) TABS tablet Take 1 tablet by mouth daily. 08/24/13  Yes Eulogio Bear U, DO  potassium chloride SA (K-DUR,KLOR-CON) 20 MEQ tablet Take 2 tablets (40 mEq total) by mouth daily. 08/24/16  Yes Hoyt Koch, MD  thiamine 100 MG tablet Take 1 tablet (100 mg total) by mouth daily. 07/31/15  Yes Hoyt Koch, MD    Family History Family History  Problem Relation Age of Onset  . Hypertension Father     Social History Social History  Substance Use Topics  . Smoking status: Never Smoker  . Smokeless tobacco: Never Used  . Alcohol use 4.8 oz/week    8 Cans of beer per week     Comment: former ETOH abuse      Allergies   Patient has no known allergies.   Review of Systems Review of Systems  All other systems reviewed and are negative.    Physical Exam Updated Vital Signs BP (!) 130/97   Pulse (!) 107   Temp 98.3 F (36.8 C) (Oral)   Resp (!) 21   SpO2 100%   Physical Exam  Constitutional: He is oriented to person, place, and time. He appears well-developed.  Overweight  HENT:  Head: Normocephalic and atraumatic.  Right Ear: External ear normal.  Left Ear: External ear normal.  Eyes: Pupils are equal, round, and reactive to light. Conjunctivae and EOM are normal.  Neck: Normal range of motion and phonation normal. Neck supple.  Cardiovascular: Regular rhythm and normal heart sounds.   Tachycardic  Pulmonary/Chest: Effort normal and breath sounds normal. No respiratory distress. He exhibits no bony tenderness.    Abdominal: Soft. There is no tenderness.  Musculoskeletal: Normal range of motion. He exhibits edema (2+ lower legs bilaterally). He exhibits no deformity.  Neurological: He is alert and oriented to person, place, and time. No cranial nerve deficit or sensory deficit. He exhibits normal muscle tone. Coordination normal.  Skin: Skin is warm, dry and intact.  Psychiatric: He has a normal mood and affect. His behavior is normal. Judgment and thought content normal.  Nursing note and vitals reviewed.    ED Treatments / Results  Labs (all labs ordered are listed, but only abnormal results are displayed) Labs Reviewed  COMPREHENSIVE METABOLIC PANEL - Abnormal; Notable for the following:       Result Value   Sodium 127 (*)    Chloride 89 (*)    CO2 19 (*)    Glucose, Bld 132 (*)  Creatinine, Ser 2.00 (*)    AST 219 (*)    ALT 99 (*)    Total Bilirubin 5.5 (*)    GFR calc non Af Amer 34 (*)    GFR calc Af Amer 39 (*)    Anion gap 19 (*)    All other components within normal limits  CBC WITH DIFFERENTIAL/PLATELET - Abnormal; Notable for the following:    RBC 3.42 (*)    Hemoglobin 12.8 (*)    HCT 36.6 (*)    MCV 107.0 (*)    MCH 37.4 (*)    Platelets 83 (*)    Lymphs Abs 0.4 (*)    All other components within normal limits  I-STAT CHEM 8, ED - Abnormal; Notable for the following:    Sodium 126 (*)    Chloride 88 (*)    Creatinine, Ser 1.80 (*)    Glucose, Bld 125 (*)    Calcium, Ion 1.00 (*)    All other components within normal limits  PROTIME-INR  I-STAT TROPONIN, ED    EKG  EKG Interpretation  Date/Time:  Friday November 20 2016 11:29:45 EDT Ventricular Rate:  139 PR Interval:    QRS Duration: 79 QT Interval:  305 QTC Calculation: 458 R Axis:   -2 Text Interpretation:  Atrial fibrillation Low voltage, extremity leads Baseline wander in lead(s) III Since last tracing rate faster Confirmed by Daleen Bo [phone]) on 11/20/2016 2:11:27 PM      CHA2DS2/VAS Stroke  Risk Points      2 >= 2 Points: High Risk  1 - 1.99 Points: Medium Risk  0 Points: Low Risk    This is the only CHA2DS2/VAS Stroke Risk Points available for the past  year.:  Change: N/A         Details    Note: External data might be a factor in metrics not marked with    Points Metrics   This score determines the patient's risk of having a stroke if the  patient has atrial fibrillation.       1 Has Congestive Heart Failure:  Yes   0 Has Vascular Disease:  No   1 Has Hypertension:  Yes   0 Age:  51   0 Has Diabetes:  No   0 Had Stroke:  No Had TIA:  No Had thromboembolism:  No   0 Male:  No         Radiology Dg Chest Port 1 View  Result Date: 11/20/2016 CLINICAL DATA:  Tachycardia, weakness EXAM: PORTABLE CHEST 1 VIEW COMPARISON:  10/23/2015 FINDINGS: Low lung volumes and slight rotation to the left. Minor basilar atelectasis. Normal heart size and vascularity. No focal pneumonia, collapse or consolidation. Negative for edema, effusion or pneumothorax. Trachea is midline. Degenerative changes of the spine. Remote cholecystectomy noted. IMPRESSION: Low volume chest exam with basilar atelectasis. Negative for acute pneumonia or CHF. Electronically Signed   By: Jerilynn Mages.  Shick M.D.   On: 11/20/2016 11:56    Procedures Procedures (including critical care time)  Medications Ordered in ED Medications  sodium chloride 0.9 % bolus 1,000 mL (1,000 mLs Intravenous New Bag/Given 11/20/16 1151)     Initial Impression / Assessment and Plan / ED Course  I have reviewed the triage vital signs and the nursing notes.  Pertinent labs & imaging results that were available during my care of the patient were reviewed by me and considered in my medical decision making (see chart for details).  Clinical Course as of  Nov 20 1657  Fri Nov 20, 2016  1409 NAD DG Chest [place] 1 View [EW]  1526 Tachycardic response, with walking, blood pressure stable.  Evaluation initiated for possible PE.  He appears  dehydrated so additional fluids given.  [EW]  [id number] Nuclear medicine study to rule out PE ordered, intermediate risk with positive d-dimer.  [EW]    Clinical Course User Index [EW] Daleen Bo, MD     Patient Vitals for the past 24 hrs:  BP Temp Temp src Pulse Resp SpO2  11/20/16 1300 (!) 130/97 - - (!) 107 (!) 21 100 %  11/20/16 1200 (!) 111/93 - - - 18 98 %  11/20/16 1145 - - - (!) 120 16 98 %  11/20/16 1139 (!) 145/100 98.3 F (36.8 C) Oral (!) 129 (!) 25 95 %    2:10 PM Reevaluation with update and discussion. After initial assessment and treatment, an updated evaluation reveals tachycardic and tachypneic, following ambulation trial.  Will evaluate for PE.Daleen Bo L    CRITICAL CARE Performed by: Richarda Blade Total critical care time: 35 minutes Critical care time was exclusive of separately billable procedures and treating other patients. Critical care was necessary to treat or prevent imminent or life-threatening deterioration. Critical care was time spent personally by me on the following activities: development of treatment plan with patient and/or surrogate as well as nursing, discussions with consultants, evaluation of patient's response to treatment, examination of patient, obtaining history from patient or surrogate, ordering and performing treatments and interventions, ordering and review of laboratory studies, ordering and review of radiographic studies, pulse oximetry and re-evaluation of patient's condition.  Final Clinical Impressions(s) / ED Diagnoses   Final diagnoses:  Weakness  Tachycardia  AKI (acute kidney injury) ([place])  Atrial fibrillation with RVR ([place])    Patient with nonspecific leg weakness, and significant tachycardia, worsened with ambulation, associated with shortness of breath.  Clinical concern for pulmonary embolus.  A. fib RVR improves with rest, and fluids.  Nursing Notes Reviewed/ Care Coordinated Applicable Imaging  Reviewed Interpretation of Laboratory Data incorporated into ED treatment   Plan-as per oncoming provider team.  New Prescriptions New Prescriptions   No medications on file     Daleen Bo, MD 11/21/16 1628

## 2016-11-20 NOTE — ED Notes (Signed)
Attempted to call report

## 2016-11-20 NOTE — ED Notes (Signed)
MD notified that patient reports feeling better, HR remains between 102-130.

## 2016-11-20 NOTE — ED Notes (Signed)
MD Wentz at the bedside  

## 2016-11-20 NOTE — ED Notes (Signed)
X-ray at bedside

## 2016-11-20 NOTE — ED Triage Notes (Signed)
Pt presents for evaluation of multiple complaints. EMS reports called out on report of slurred speech, patient noted to be in Afib RVR rhythm. Pt reports has not been taking medications all the time. Patient reports hoarse voice, diarrhea, and RUQ pain that he attributes to a fall 2 days ago. EMS reports they responded to fall and assisted patient back to chair. Patient is non ambulatory at baseline.

## 2016-11-20 NOTE — H&P (Signed)
[facility]                                                                                    Patient Demographics  Thomas [name], is a 66 y.o. male  CSN: [id number]  MRN: [id number]  DOB - 11-May-1950  Admit Date - 11/20/2016  Outpatient Primary MD for the patient is Hoyt Koch, MD   With History of -  Past Medical History:  Diagnosis Date  . Alcoholic liver disease ([place])   . Arthritis   . Chronic atrial fibrillation ([name])    no an anticoagulation canditate due to alcohol abuse, liver cirrhosis with pancytopenia, medical noncompliance  . Chronic systolic CHF (congestive heart failure), NYHA class 2 ([place])   . DCM (dilated cardiomyopathy) ([place])    EF 30% but now improved to 40-45% by echo 2016 on medical therapy with moderate MR  . Diverticulosis of colon   . Edema extremities 07/02/2015  . ETOH abuse   . Gastroduodenitis    H Pylori positive  . Helicobacter pylori gastritis 10/19/2013  . Hypertension   . Obesity   . Pancytopenia ([place])   . Personal history of colonic polyps - adenomas 08/21/2013  . Portal hypertensive gastropathy [facility])       Past Surgical History:  Procedure Laterality Date  . COLONOSCOPY N/A 08/21/2013   Procedure: COLONOSCOPY;  Surgeon: Jerene Bears, MD;  Location: [facility] ENDOSCOPY;  Service: Endoscopy;  Laterality: N/A;  . ESOPHAGOGASTRODUODENOSCOPY N/A 08/21/2013   Procedure: ESOPHAGOGASTRODUODENOSCOPY (EGD);  Surgeon: Jerene Bears, MD;  Location: [facility] ENDOSCOPY;  Service: Endoscopy;  Laterality: N/A;  . HIP CLOSED REDUCTION Right 08/13/2015   Procedure: CLOSED REDUCTION HIP;  Surgeon: [name], MD;  Location: [place] NEURO ORS;  Service: Orthopedics;  Laterality: Right;  . TOTAL HIP ARTHROPLASTY Right 05/20/2015   Procedure: TOTAL HIP ARTHROPLASTY;  Surgeon: Frederik Pear, MD;  Location: [place];  Service: Orthopedics;  Laterality: Right;  . TOTAL HIP REVISION Right 09/09/2015   Procedure: TOTAL HIP REVISION;  Surgeon: Frederik Pear, MD;   Location: [place];  Service: Orthopedics;  Laterality: Right;    in for   Chief Complaint  Patient presents with  . Aphasia     HPI  Thomas [name]  is a 66 y.o. male, With past medical history significant for congestive heart failure, chronic atrial fib and alcoholic liver disease, presenting with 2 days history of generalized weakness, slurred speech, recent falls. He denies chest pains or shortness of breath. No nausea or vomiting however has few episodes of diarrhea aches stools. Patient denies any dizziness or loss of consciousness. Patient reports that he has been noncompliant with his medications and he hasn't taken his Coreg for more than 3 days. I asked him about HIV test which is recommended by thr CDC and he agreed to have it done . In the emergency room the patient was found to be in acute A. fib with rapid ventricular rate and VQ scan was done which was indeterminate. He received IV Lopressor and he seems to be slightly dehydrated so will be started on normal saline for 500 mL only due to his congestive heart failure.Patient complaining  of right hip pain which has been present for around 2 years after hip surgery.    Review of Systems    In addition to the HPI above,  No Fever-chills, No Headache, No changes with Vision or hearing, No problems swallowing food or Liquids, No Chest pain, Cough or Shortness of Breath, No Abdominal pain, No Nausea or Vommitting,  No Blood in stool or Urine, No dysuria, No new skin rashes or bruises, No new joints pains-aches,  No recent weight gain or loss, No polyuria, polydypsia or polyphagia, No significant Mental Stressors.  A full 10 point Review of Systems was done, except as stated above, all other Review of Systems were negative.   Social History Social History  Substance Use Topics  . Smoking status: Never Smoker  . Smokeless tobacco: Never Used  . Alcohol use 4.8 oz/week    8 Cans of beer per week     Comment: former ETOH  abuse      Family History Family History  Problem Relation Age of Onset  . Hypertension Father      Prior to Admission medications   Medication Sig Start Date End Date Taking? Authorizing Provider  aspirin EC 325 MG tablet Take 1 tablet (325 mg total) by mouth 2 (two) times daily. 09/09/15  Yes Leighton Parody, PA-C  carvedilol (COREG) 12.5 MG tablet Take 0.5 tablets (6.25 mg total) by mouth 2 (two) times daily with a meal. Need to make follow-up appt for future refills Patient taking differently: Take 12.5 mg by mouth 2 (two) times daily with a meal. Need to make follow-up appt for future refills 08/24/16  Yes Hoyt Koch, MD  folic acid (FOLVITE) 1 MG tablet Take 1 tablet (1 mg total) by mouth daily. 07/31/15  Yes Hoyt Koch, MD  furosemide (LASIX) 40 MG tablet Take 1 tablet (40 mg total) by mouth 3 (three) times daily. 08/24/16  Yes Hoyt Koch, MD  lidocaine (LIDODERM) 5 % Place 1 patch onto the skin 2 (two) times daily as needed. Remove & Discard patch within 12 hours or as directed by MD 09/02/15  Yes Hoyt Koch, MD  losartan (COZAAR) 25 MG tablet Take 0.5 tablets (12.5 mg total) by mouth daily. 08/24/16  Yes Hoyt Koch, MD  methocarbamol (ROBAXIN) 500 MG tablet Take 1 tablet (500 mg total) by mouth 2 (two) times daily with a meal. 05/20/15  Yes Leighton Parody, PA-C  Multiple Vitamin (MULTIVITAMIN WITH MINERALS) TABS tablet Take 1 tablet by mouth daily. 08/24/13  Yes Eulogio Bear U, DO  potassium chloride SA (K-DUR,KLOR-CON) 20 MEQ tablet Take 2 tablets (40 mEq total) by mouth daily. 08/24/16  Yes Hoyt Koch, MD  thiamine 100 MG tablet Take 1 tablet (100 mg total) by mouth daily. 07/31/15  Yes Hoyt Koch, MD    No Known Allergies  Physical Exam  Vitals  Blood pressure (!) 131/105, pulse (!) 129, temperature 99 F (37.2 C), temperature source Oral, resp. rate 15, SpO2 96 %.   1. General Obese male, very pleasant,  looks slightly anxious  2. Normal affect and insight, Not Suicidal or Homicidal, Awake Alert, Oriented X 3.  3. No F.N deficits, grossly. Patient moving all extremities.  4. Ears and Eyes appear Normal, Conjunctivae clear, PERRLA. Moist Oral Mucosa.  5. Supple Neck, No JVD, No cervical lymphadenopathy appriciated, No Carotid Bruits.  6. Symmetrical Chest wall movement, Good air movement bilaterally, CTAB.  7. Irregularly irregular, No Gallops,  Rubs or Murmurs, No Parasternal Heave.  8. Positive Bowel Sounds, Abdomen Soft, Non tender, No organomegaly appriciated,No rebound -guarding or rigidity.  9.  No Cyanosis, Normal Skin Turgor, No Skin Rash or Bruise.  10. Good muscle tone,  joints appear normal , no effusions, Normal ROM.    Data Review  CBC  Recent Labs Lab 11/20/16 1140 11/20/16 1207  WBC 5.7  --   HGB 12.8* 13.3  HCT 36.6* 39.0  PLT 83*  --   MCV 107.0*  --   MCH 37.4*  --   MCHC 35.0  --   RDW 14.1  --   LYMPHSABS 0.4*  --   MONOABS 0.7  --   EOSABS 0.0  --   BASOSABS 0.0  --    ------------------------------------------------------------------------------------------------------------------  Chemistries   Recent Labs Lab 11/20/16 1140 11/20/16 1207  NA 127* 126*  K 5.0 4.8  CL 89* 88*  CO2 19*  --   GLUCOSE 132* 125*  BUN 11 14  CREATININE 2.00* 1.80*  CALCIUM 9.2  --   AST 219*  --   ALT 99*  --   ALKPHOS 90  --   BILITOT 5.5*  --    ------------------------------------------------------------------------------------------------------------------ CrCl cannot be calculated (Unknown ideal weight.). ------------------------------------------------------------------------------------------------------------------ No results for input(s): TSH, T4TOTAL, T3FREE, THYROIDAB in the last 72 hours.  Invalid input(s): FREET3   Coagulation profile  Recent Labs Lab 11/20/16 1140  INR 1.00    -------------------------------------------------------------------------------------------------------------------  Recent Labs  11/20/16 1543  DDIMER 1.28*   -------------------------------------------------------------------------------------------------------------------  Cardiac Enzymes No results for input(s): CKMB, TROPONINI, MYOGLOBIN in the last 168 hours.  Invalid input(s): CK ------------------------------------------------------------------------------------------------------------------ Invalid input(s): POCBNP   ---------------------------------------------------------------------------------------------------------------  Urinalysis    Component Value Date/Time   COLORURINE ORANGE (A) 10/23/2015 1100   APPEARANCEUR CLOUDY (A) 10/23/2015 1100   LABSPEC 1.028 10/23/2015 1100   PHURINE 6.0 10/23/2015 1100   GLUCOSEU NEGATIVE 10/23/2015 1100   HGBUR NEGATIVE 10/23/2015 1100   BILIRUBINUR MODERATE (A) 10/23/2015 1100   KETONESUR >80 (A) 10/23/2015 1100   PROTEINUR 100 (A) 10/23/2015 1100   NITRITE NEGATIVE 10/23/2015 1100   LEUKOCYTESUR TRACE (A) 10/23/2015 1100    ----------------------------------------------------------------------------------------------------------------     Imaging results:   Nm Pulmonary Vent And Perf (v/q Scan)  Result Date: 11/20/2016 CLINICAL DATA:  Shortness of breath for the past 3 days following a fall. Positive D-dimer. EXAM: NUCLEAR MEDICINE VENTILATION - PERFUSION LUNG SCAN TECHNIQUE: Ventilation images were obtained in multiple projections using inhaled aerosol Tc-61m DTPA. Perfusion images were obtained in multiple projections after intravenous injection of Tc-9m MAA. RADIOPHARMACEUTICALS:  30.2 mCi Technetium-20m DTPA aerosol inhalation and 4.2 mCi Technetium-29m MAA IV COMPARISON:  Portable chest obtained earlier today. FINDINGS: Ventilation: Horizontally oriented linear ventilation defect in the superior segment of the  right lower lobe. No corresponding abnormality on the portable chest radiograph obtained earlier today. Perfusion: Horizontally oriented linear perfusion defect in the superior segment of the right lower lobe, corresponding to the ventilation defect. Otherwise, normal perfusion of both lungs. IMPRESSION: Intermediate probability for pulmonary embolism. If there are no contraindications, a CT angiogram of the chest is recommended. Electronically Signed   By: Claudie Revering M.D.   On: 11/20/2016 18:51   Dg Chest Port 1 View  Result Date: 11/20/2016 CLINICAL DATA:  Tachycardia, weakness EXAM: PORTABLE CHEST 1 VIEW COMPARISON:  10/23/2015 FINDINGS: Low lung volumes and slight rotation to the left. Minor basilar atelectasis. Normal heart size and vascularity. No focal pneumonia, collapse or consolidation. Negative for  edema, effusion or pneumothorax. Trachea is midline. Degenerative changes of the spine. Remote cholecystectomy noted. IMPRESSION: Low volume chest exam with basilar atelectasis. Negative for acute pneumonia or CHF. Electronically Signed   By: Jerilynn Mages.  Shick M.D.   On: 11/20/2016 11:56    My personal review of EKG: Atrial fibrillation with rapid ventricular rate and PVCs at 135 bpm  Assessment & Plan  1. Atrial fib with rapid ventricular rate. A combination of dehydration and noncompliance with beta blockers. 2. Congestive heart failure, systolic. Last echocardiogram in 2016 showed an ejection fraction of 40-45%. 3. Hyponatremia 4. Acute on chronic renal failure 5. Noncompliance  Plan  Restart Coreg IV Lopressor when necessary Slow hydration Monitor creatinine and am Start IV heparin for at. fib and switch to by mouth later Check echocardiogram Check TSH   DVT Prophylaxis Heparin  AM Labs Ordered, also please review Full Orders    Code Status full  Disposition Plan: Home  Time spent in minutes : 46 minutes  Condition GUARDED   @SIGNATURE @

## 2016-11-20 NOTE — ED Notes (Signed)
Ambulated pt in hallway. With the use of walker. Pt became diaphoretic and sob pt brought back to room in wheelchair. DR.Wentz informed hr 199. Pt HR down to 150

## 2016-11-21 ENCOUNTER — Inpatient Hospital Stay (HOSPITAL_COMMUNITY): Payer: 59

## 2016-11-21 DIAGNOSIS — N179 Acute kidney failure, unspecified: Secondary | ICD-10-CM

## 2016-11-21 DIAGNOSIS — R4781 Slurred speech: Secondary | ICD-10-CM

## 2016-11-21 DIAGNOSIS — I361 Nonrheumatic tricuspid (valve) insufficiency: Secondary | ICD-10-CM

## 2016-11-21 DIAGNOSIS — I4891 Unspecified atrial fibrillation: Secondary | ICD-10-CM

## 2016-11-21 DIAGNOSIS — R531 Weakness: Secondary | ICD-10-CM

## 2016-11-21 DIAGNOSIS — D696 Thrombocytopenia, unspecified: Secondary | ICD-10-CM

## 2016-11-21 LAB — BASIC METABOLIC PANEL
Anion gap: 10 (ref 5–15)
BUN: 12 mg/dL (ref 6–20)
CHLORIDE: 93 mmol/L — AB (ref 101–111)
CO2: 25 mmol/L (ref 22–32)
CREATININE: 1.63 mg/dL — AB (ref 0.61–1.24)
Calcium: 8.4 mg/dL — ABNORMAL LOW (ref 8.9–10.3)
GFR calc Af Amer: 50 mL/min — ABNORMAL LOW (ref >60)
GFR calc non Af Amer: 43 mL/min — ABNORMAL LOW (ref >60)
Glucose, Bld: 98 mg/dL (ref 65–99)
Potassium: 3.7 mmol/L (ref 3.5–5.1)
SODIUM: 128 mmol/L — AB (ref 135–145)

## 2016-11-21 LAB — CBC
HCT: 31.5 % — ABNORMAL LOW (ref 39.0–52.0)
Hemoglobin: 10.8 g/dL — ABNORMAL LOW (ref 13.0–17.0)
MCH: 37.2 pg — AB (ref 26.0–34.0)
MCHC: 34.3 g/dL (ref 30.0–36.0)
MCV: 108.6 fL — AB (ref 78.0–100.0)
PLATELETS: 77 10*3/uL — AB (ref 150–400)
RBC: 2.9 MIL/uL — ABNORMAL LOW (ref 4.22–5.81)
RDW: 14.3 % (ref 11.5–15.5)
WBC: 4.7 10*3/uL (ref 4.0–10.5)

## 2016-11-21 LAB — HEPARIN LEVEL (UNFRACTIONATED): Heparin Unfractionated: 0.6 IU/mL (ref 0.30–0.70)

## 2016-11-21 LAB — ECHOCARDIOGRAM COMPLETE
CHL CUP STROKE VOLUME: 40 mL
EWDT: 173 ms
FS: 20 % — AB (ref 28–44)
Height: 72 in
IVS/LV PW RATIO, ED: 0.97
LA diam index: 1.35 cm/m2
LA vol index: 36.2 mL/m2
LA vol: 96.7 mL
LASIZE: 36 mm
LAVOLA4C: 101 mL
LDCA: 2.84 cm2
LEFT ATRIUM END SYS DIAM: 36 mm
LV SIMPSON'S DISK: 49
LV dias vol index: 31 mL/m2
LV dias vol: 82 mL (ref 62–150)
LV sys vol index: 16 mL/m2
LVOT SV: 36 mL
LVOT VTI: 12.8 cm
LVOT diameter: 19 mm
LVOTPV: 79.6 cm/s
LVSYSVOL: 42 mL (ref 21–61)
MV Dec: 173
MVPG: 3 mmHg
MVPKEVEL: 93.4 m/s
PW: 10 mm — AB (ref 0.6–1.1)
RV TAPSE: 14.3 mm
RV sys press: 26 mmHg
Reg peak vel: 240 cm/s
TRMAXVEL: 240 cm/s
Weight: 4756.8 oz

## 2016-11-21 LAB — MRSA PCR SCREENING: MRSA by PCR: NEGATIVE

## 2016-11-21 LAB — TSH: TSH: 4.023 u[IU]/mL (ref 0.350–4.500)

## 2016-11-21 LAB — HIV ANTIBODY (ROUTINE TESTING W REFLEX): HIV Screen 4th Generation wRfx: NONREACTIVE

## 2016-11-21 MED ORDER — ENOXAPARIN SODIUM 40 MG/0.4ML ~~LOC~~ SOLN
40.0000 mg | SUBCUTANEOUS | Status: DC
  Administered 2016-11-21: 40 mg via SUBCUTANEOUS
  Filled 2016-11-21: qty 0.4

## 2016-11-21 MED ORDER — HEPARIN BOLUS VIA INFUSION
3000.0000 [IU] | Freq: Once | INTRAVENOUS | Status: AC
  Administered 2016-11-21: 3000 [IU] via INTRAVENOUS
  Filled 2016-11-21: qty 3000

## 2016-11-21 MED ORDER — CARVEDILOL 25 MG PO TABS
25.0000 mg | ORAL_TABLET | Freq: Two times a day (BID) | ORAL | Status: DC
  Administered 2016-11-21: 25 mg via ORAL
  Filled 2016-11-21 (×3): qty 1

## 2016-11-21 NOTE — Progress Notes (Signed)
*  PRELIMINARY RESULTS* Echocardiogram 2D Echocardiogram has been performed.  Thomas [name] 11/21/2016, 5:40 PM

## 2016-11-21 NOTE — Progress Notes (Signed)
Patient with fluctuating BP's 7 a to 7 p shift, had one episode this morning where he was dizzy, felt hot and BP was low.  Primary MD was rounding at this time, by the time he spoke to patient he was asymptomatic.  Patient did sit on side of bed, orthostatics obtained without significant drop in BP but heart rate did elevate significantly upon standing.  Patient with low urine output, states this is because we are not giving him his lasix.    Patient awaiting CT of head and carotid dopplers.

## 2016-11-21 NOTE — Progress Notes (Signed)
ANTICOAGULATION CONSULT NOTE - Follow Up Consult  Pharmacy Consult for Heparin  Indication: atrial fibrillation  No Known Allergies  Patient Measurements: Weight: 297 lb 4.8 oz (134.9 kg)  Vital Signs: Temp: 98.7 F (37.1 C) (08/04 1138) Temp Source: Oral (08/04 1138) BP: 139/110 (08/04 1138) Pulse Rate: 99 (08/04 1138)  Labs:  Recent Labs  11/20/16 1140 11/20/16 1207 11/21/16 0341 11/21/16 1259  HGB 12.8* 13.3 10.8*  --   HCT 36.6* 39.0 31.5*  --   PLT 83*  --  77*  --   LABPROT 13.2  --   --   --   INR 1.00  --   --   --   HEPARINUNFRC  --   --  <0.10* 0.60  CREATININE 2.00* 1.80* 1.63*  --     Estimated Creatinine Clearance: 65.9 mL/min (A) (by C-G formula based on SCr of 1.63 mg/dL (H)).   Assessment: Heparin for afib/RVR, patient was given 3000 unit bolus at 0550, and started 1700 units/hr infusion this morning. Heparin level went from undetectable to therapeutic 0.6, no issues per RN.   Goal of Therapy:  Heparin level 0.3-0.7 units/ml Monitor platelets by anticoagulation protocol: Yes   Plan:  Continue heparin drip at 1700 units/hr. F/u daily heparin level, CBCs, s/sx bleeding 1900 HL  Nida Boatman, PharmD PGY1 Acute Care Pharmacy Resident Pager: [phone] 11/21/2016,10:22 AM

## 2016-11-21 NOTE — Progress Notes (Signed)
PROGRESS NOTE    Thomas [name]  BTD:[id number] DOB: 09-May-1950 DOA: 11/20/2016 PCP: Hoyt Koch, MD   Brief Narrative: 66 year old male with history of chronic atrial fibrillation not on anticoagulation as per prior cardiology note, alcohol liver disease, chronic thrombocytopenia, chronic systolic congestive heart failure, hypertension presented with slurred, generalized weakness and frequent falls. Patient denied chest pain or shortness of breath. In the ER patient was found to have A. fib with RVR and admitted for further evaluation. VQ scan was indeterminate, patient is asymptomatic and no hypoxia.  Assessment & Plan:   Active Problems:   Atrial fibrillation with RVR ([place])  # Atrial fibrillation with rapid ventricular response: May have been contributed by noncompliance. Chest x-ray unremarkable. I increased the dose of Coreg to 25 twice a day. Monitor heart rate and telemeter. Follow-up echocardiogram. Cardiology consulted. Patient does not have chest pain or shortness of breath. -Discontinue heparin since patient has thrombocytopenia, liver disease, history of noncompliance. Prior cardiology note reported that not on anticoagulation because of these multiple factors.  #Chronic systolic congestive heart failure: Last Echo in 2016 with EF of 40-45%. Looks euvolemic on exam. Discontinue IV fluid and hold Lasix today. Patient was hypotensive this morning.  # Slurry speech, recurrent fall and generalized weakness unknown if it is contributed by alcohol abuse: -Follow up echocardiogram -Check orthostatic blood pressure, CT scan of the head, carotid ultrasound, A1c level, lipid panel -PT would evaluation and supportive care.  #Hypotension: Holding home medication including losartan. Hold Lasix. Adjusting the dose of Coreg for rate control. Monitor blood pressure closely. Blood pressure improved now.  # Acute injury on chronic  kidney disease stage III: Likely in the setting of A.  fib with RVR. Serum creatinine level trending down. UA unremarkable. Patient does not have any urinary symptoms. Monitor BMP.  #Hyponatremia: Serum sodium level 128 Discontinue IV fluid. Check urinary electrolytes. Monitor BMP.  #Alcohol abuse/alcohol related liver disease: No sign of withdrawal. Continue vitamins and supportive care.  #Thrombocytopenia likely in the setting of liver disease: No sign of bleeding.  DVT prophylaxis: Lovenox subcutaneous Code Status: Full code Family Communication: No family at bedside Disposition Plan: Likely discharge home in 1-2 days    Consultants:   None  Procedures: None Antimicrobials: None  Subjective: Seen and examined at bedside. Denied headache, as it is, nausea vomiting chest pain shortness of breath.  Objective: Vitals:   11/21/16 0850 11/21/16 1021 11/21/16 1025 11/21/16 1138  BP: (!) 66/42 (!) 128/114 122/62 (!) 139/110  Pulse:  88  99  Resp:    20  Temp:    98.7 F (37.1 C)  TempSrc:    Oral  SpO2:  100%  100%  Weight:        Intake/Output Summary (Last 24 hours) at 11/21/16 1453 Last data filed at 11/21/16 1404  Gross per 24 hour  Intake              360 ml  Output                0 ml  Net              360 ml   Filed Weights   11/20/16 2228  Weight: 134.9 kg (297 lb 4.8 oz)    Examination:  General exam: Appears calm and comfortable  Respiratory system: Clear to auscultation. Respiratory effort normal. No wheezing or crackle Cardiovascular system: S1 & S2 heard, RRR.  No pedal edema. Gastrointestinal system: Abdomen is nondistended, soft  and nontender. Normal bowel sounds heard. Central nervous system: Alert and oriented. No focal neurological deficits. Skin: No rashes, lesions or ulcers Psychiatry: Judgement and insight appear normal. Mood & affect appropriate.     Data Reviewed: I have personally reviewed following labs and imaging studies  CBC:  Recent Labs Lab 11/20/16 1140 11/20/16 1207  11/21/16 0341  WBC 5.7  --  4.7  NEUTROABS 4.6  --   --   HGB 12.8* 13.3 10.8*  HCT 36.6* 39.0 31.5*  MCV 107.0*  --  108.6*  PLT 83*  --  77*   Basic Metabolic Panel:  Recent Labs Lab 11/20/16 1140 11/20/16 1207 11/21/16 0341  NA 127* 126* 128*  K 5.0 4.8 3.7  CL 89* 88* 93*  CO2 19*  --  25  GLUCOSE 132* 125* 98  BUN 11 14 12   CREATININE 2.00* 1.80* 1.63*  CALCIUM 9.2  --  8.4*   GFR: Estimated Creatinine Clearance: 65.9 mL/min (A) (by C-G formula based on SCr of 1.63 mg/dL (H)). Liver Function Tests:  Recent Labs Lab 11/20/16 1140  AST 219*  ALT 99*  ALKPHOS 90  BILITOT 5.5*  PROT 6.9  ALBUMIN 3.5   No results for input(s): LIPASE, AMYLASE in the last 168 hours. No results for input(s): AMMONIA in the last 168 hours. Coagulation Profile:  Recent Labs Lab 11/20/16 1140  INR 1.00   Cardiac Enzymes: No results for input(s): CKTOTAL, CKMB, CKMBINDEX, TROPONINI in the last 168 hours. BNP (last 3 results) No results for input(s): PROBNP in the last 8760 hours. HbA1C: No results for input(s): HGBA1C in the last 72 hours. CBG: No results for input(s): GLUCAP in the last 168 hours. Lipid Profile: No results for input(s): CHOL, HDL, LDLCALC, TRIG, CHOLHDL, LDLDIRECT in the last 72 hours. Thyroid Function Tests:  Recent Labs  11/21/16 0341  TSH 4.023   Anemia Panel: No results for input(s): VITAMINB12, FOLATE, FERRITIN, TIBC, IRON, RETICCTPCT in the last 72 hours. Sepsis Labs: No results for input(s): PROCALCITON, LATICACIDVEN in the last 168 hours.  Recent Results (from the past 240 hour(s))  MRSA PCR Screening     Status: None   Collection Time: 11/20/16 10:16 PM  Result Value Ref Range Status   MRSA by PCR NEGATIVE NEGATIVE Final    Comment:        The GeneXpert MRSA Assay (FDA approved for NASAL specimens only), is one component of a comprehensive MRSA colonization surveillance program. It is not intended to diagnose MRSA infection nor to  guide or monitor treatment for MRSA infections.          Radiology Studies: Nm Pulmonary Vent And Perf (v/q Scan)  Result Date: 11/20/2016 CLINICAL DATA:  Shortness of breath for the past 3 days following a fall. Positive D-dimer. EXAM: NUCLEAR MEDICINE VENTILATION - PERFUSION LUNG SCAN TECHNIQUE: Ventilation images were obtained in multiple projections using inhaled aerosol Tc-77m DTPA. Perfusion images were obtained in multiple projections after intravenous injection of Tc-43m MAA. RADIOPHARMACEUTICALS:  30.2 mCi Technetium-12m DTPA aerosol inhalation and 4.2 mCi Technetium-36m MAA IV COMPARISON:  Portable chest obtained earlier today. FINDINGS: Ventilation: Horizontally oriented linear ventilation defect in the superior segment of the right lower lobe. No corresponding abnormality on the portable chest radiograph obtained earlier today. Perfusion: Horizontally oriented linear perfusion defect in the superior segment of the right lower lobe, corresponding to the ventilation defect. Otherwise, normal perfusion of both lungs. IMPRESSION: Intermediate probability for pulmonary embolism. If there are no contraindications, a CT  angiogram of the chest is recommended. Electronically Signed   By: Claudie Revering M.D.   On: 11/20/2016 18:51   Dg Chest Port 1 View  Result Date: 11/20/2016 CLINICAL DATA:  Tachycardia, weakness EXAM: PORTABLE CHEST 1 VIEW COMPARISON:  10/23/2015 FINDINGS: Low lung volumes and slight rotation to the left. Minor basilar atelectasis. Normal heart size and vascularity. No focal pneumonia, collapse or consolidation. Negative for edema, effusion or pneumothorax. Trachea is midline. Degenerative changes of the spine. Remote cholecystectomy noted. IMPRESSION: Low volume chest exam with basilar atelectasis. Negative for acute pneumonia or CHF. Electronically Signed   By: Jerilynn Mages.  Shick M.D.   On: 11/20/2016 11:56        Scheduled Meds: . aspirin EC  81 mg Oral BID  . carvedilol  25 mg  Oral BID WC  . feeding supplement (ENSURE ENLIVE)  237 mL Oral BID BM  . folic acid  1 mg Oral Daily  . methocarbamol  500 mg Oral BID WC  . multivitamin with minerals  1 tablet Oral Daily  . sodium chloride flush  3 mL Intravenous Q12H  . thiamine  100 mg Oral Daily   Continuous Infusions:   LOS: 1 day    Ahja Martello Tanna Furry, MD [facility] Pager [phone]  If 7PM-7AM, please contact night-coverage [email or web address] Password [email or web address] 11/21/2016, 2:53 PM

## 2016-11-21 NOTE — Plan of Care (Signed)
Problem: Physical Regulation: Goal: Ability to maintain clinical measurements within normal limits will improve Outcome: Progressing Heart rate improved, elevates with activity   Problem: Tissue Perfusion: Goal: Risk factors for ineffective tissue perfusion will decrease Outcome: Progressing Oxygen saturation remains in the 90's to 100 on room air   Problem: Activity: Goal: Risk for activity intolerance will decrease Outcome: Progressing Patient up with walker in room

## 2016-11-21 NOTE — Consult Note (Signed)
Cardiology Consultation:   Patient ID: Thomas [name]; [id number]; [birth year]   Admit date: 11/20/2016 Date of Consult: 11/21/2016  Primary Care Provider: Hoyt Koch, MD Primary Cardiologist: Dr. Radford Pax    Patient Profile:   67 y/o male, followed by Dr. Radford Pax, with h/o EtOH abuse, atrial fibrillation, pancytopenia secondary to alcoholism, chronic systolic heart failure, and a history of dilated cardiomyopathy secondary to alcohol abuse, admitted for afib w/ RVR in the setting of dehydration and noncompliance with his beta blocker. Cardiology consulted for assistance by Dr. Carolin Sicks, internal medicine.   History of Present Illness:   66 y/o male, followed by Dr. Radford Pax, with h/o EtOH abuse, atrial fibrillation, pancytopenia secondary to alcoholism, chronic systolic heart failure, and a history of dilated cardiomyopathy secondary to alcohol abuse. He had a Myoview on 08/23/2013 which showed EF 36% with diffuse hypokinesis, suspect nonischemic cardio myopathy, no evidence of ischemia or infarction. Last echo on 02/27/2015 showed EF 40-45%, moderate MR, PA peak pressure 35 mmHg. He was felt not to be a good candidate for anticoagulation due to his alcoholism and medical noncompliance. Previous CT scan showed alcoholic liver disease with evidence of cirrhosis.   He presented to the ED today with complaint of generalized weakness, slurred speech and recent falls. On arrival, he was noted to be in atrial fibrillation with RVR in the 130s. This was in the setting of dehydration and upon further questioning, he had missed several doses of his beta blocker. Head CT pending. Cardiology consulted for assistance with his afib.   Of note, Pt's Scr was 2.00 but has improved with hydration, down to 1.8>>1.63. He is feeling better. He admits that he ran out of his meds about 1 week ago and has not been staying well hydrated at home. Poor PO intake.   Past Medical History:  Diagnosis Date  . Alcoholic  liver disease ([place])   . Arthritis   . Chronic atrial fibrillation ([name])    no an anticoagulation canditate due to alcohol abuse, liver cirrhosis with pancytopenia, medical noncompliance  . Chronic systolic CHF (congestive heart failure), NYHA class 2 ([place])   . DCM (dilated cardiomyopathy) ([place])    EF 30% but now improved to 40-45% by echo 2016 on medical therapy with moderate MR  . Diverticulosis of colon   . Edema extremities 07/02/2015  . ETOH abuse   . Gastroduodenitis    H Pylori positive  . Helicobacter pylori gastritis 10/19/2013  . Hypertension   . Obesity   . Pancytopenia ([place])   . Personal history of colonic polyps - adenomas 08/21/2013  . Portal hypertensive gastropathy [facility])     Past Surgical History:  Procedure Laterality Date  . COLONOSCOPY N/A 08/21/2013   Procedure: COLONOSCOPY;  Surgeon: Jerene Bears, MD;  [facility]: [facility] ENDOSCOPY;  Service: Endoscopy;  Laterality: N/A;  . ESOPHAGOGASTRODUODENOSCOPY N/A 08/21/2013   Procedure: ESOPHAGOGASTRODUODENOSCOPY (EGD);  Surgeon: Jerene Bears, MD;  Location: [facility] ENDOSCOPY;  Service: Endoscopy;  Laterality: N/A;  . HIP CLOSED REDUCTION Right 08/13/2015   Procedure: CLOSED REDUCTION HIP;  Surgeon: [name], MD;  Location: [place] NEURO ORS;  Service: Orthopedics;  Laterality: Right;  . TOTAL HIP ARTHROPLASTY Right 05/20/2015   Procedure: TOTAL HIP ARTHROPLASTY;  Surgeon: Frederik Pear, MD;  Location: [place];  Service: Orthopedics;  Laterality: Right;  . TOTAL HIP REVISION Right 09/09/2015   Procedure: TOTAL HIP REVISION;  Surgeon: Frederik Pear, MD;  Location: [place];  Service: Orthopedics;  Laterality: Right;  Inpatient Medications: Scheduled Meds: . aspirin EC  81 mg Oral BID  . carvedilol  25 mg Oral BID WC  . enoxaparin (LOVENOX) injection  40 mg Subcutaneous Q24H  . folic acid  1 mg Oral Daily  . methocarbamol  500 mg Oral BID WC  . multivitamin with minerals  1 tablet Oral Daily  . sodium chloride flush  3 mL Intravenous Q12H  .  thiamine  100 mg Oral Daily   Continuous Infusions:  PRN Meds: acetaminophen **OR** acetaminophen, HYDROcodone-acetaminophen, lidocaine, metoprolol tartrate, ondansetron **OR** ondansetron (ZOFRAN) IV, technetium TC 97M diethylenetriame-pentaacetic acid, zolpidem  Allergies:   No Known Allergies  Social History:   Social History   Social History  . Marital status: Single    Spouse name: N/A  . Number of children: 0  . Years of education: N/A   Occupational History  . Not on file.   Social History Main Topics  . Smoking status: Never Smoker  . Smokeless tobacco: Never Used  . Alcohol use 4.8 oz/week    8 Cans of beer per week     Comment: former ETOH abuse   . Drug use: Yes    Types: Marijuana     Comment: 10 yrs ago  . Sexual activity: Not on file   Other Topics Concern  . 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 Hypertension Father      ROS:  Please see the history of present illness.  ROS  All other ROS reviewed and negative.     Physical Exam/Data:   Vitals:   11/21/16 1025 11/21/16 1138 11/21/16 1519 11/21/16 1625  BP: 122/62 (!) 139/110    Pulse:  99 (!) 108   Resp:  20    Temp:  98.7 F (37.1 C)    TempSrc:  Oral    SpO2:  100%    Weight:      Height:    6' (1.829 m)    Intake/Output Summary (Last 24 hours) at 11/21/16 1728 Last data filed at 11/21/16 1524  Gross per 24 hour  Intake              360 ml  Output              300 ml  Net               60 ml   Filed Weights   11/20/16 2228  Weight: 297 lb 4.8 oz (134.9 kg)   Body mass index is 40.32 kg/m.  General:  Well nourished, well developed, in no acute distress, moderately obese  HEENT: normal Lymph: no adenopathy Neck: no JVD Endocrine:  No thryomegaly Vascular: No carotid bruits; FA pulses 2+ bilaterally without bruits  Cardiac: irregularly, irregular S1, S2; RRR; no murmur    Lungs:  clear to auscultation bilaterally, no wheezing, rhonchi or rales  Abd: soft, nontender, no hepatomegaly  Ext: trace bilateral edema Musculoskeletal:  No deformities, BUE and BLE strength normal and equal Skin: warm and dry  Neuro:  CNs 2-12 intact, no focal abnormalities noted Psych:  Normal affect   EKG:  The EKG was personally reviewed and demonstrates: atrial fibrillation  Telemetry:  Telemetry was personally reviewed and demonstrates: atrial fibrillation  Laboratory Data:  Chemistry Recent Labs Lab 11/20/16 1140 11/20/16 1207 11/21/16 0341  NA 127* 126* 128*  K  5.0 4.8 3.7  CL 89* 88* 93*  CO2 19*  --  25  GLUCOSE 132* 125* 98  BUN 11 14 12   CREATININE 2.00* 1.80* 1.63*  CALCIUM 9.2  --  8.4*  GFRNONAA 34*  --  43*  GFRAA 39*  --  50*  ANIONGAP 19*  --  10     Recent Labs Lab 11/20/16 1140  PROT 6.9  ALBUMIN 3.5  AST 219*  ALT 99*  ALKPHOS 90  BILITOT 5.5*   Hematology Recent Labs Lab 11/20/16 1140 11/20/16 1207 11/21/16 0341  WBC 5.7  --  4.7  RBC 3.42*  --  2.90*  HGB 12.8* 13.3 10.8*  HCT 36.6* 39.0 31.5*  MCV 107.0*  --  108.6*  MCH 37.4*  --  37.2*  MCHC 35.0  --  34.3  RDW 14.1  --  14.3  PLT 83*  --  77*   Cardiac EnzymesNo results for input(s): TROPONINI in the last 168 hours.  Recent Labs Lab 11/20/16 1205 11/20/16 1548  TROPIPOC 0.02 0.02    BNPNo results for input(s): BNP, PROBNP in the last 168 hours.  DDimer  Recent Labs Lab 11/20/16 1543  DDIMER 1.28*    Radiology/Studies:  Nm Pulmonary Vent And Perf (v/q Scan)  Result Date: 11/20/2016 CLINICAL DATA:  Shortness of breath for the past 3 days following a fall. Positive D-dimer. EXAM: NUCLEAR MEDICINE VENTILATION - PERFUSION LUNG SCAN TECHNIQUE: Ventilation images were obtained in multiple projections using inhaled aerosol Tc-46m DTPA. Perfusion images were obtained in multiple projections after intravenous injection of Tc-61m MAA. RADIOPHARMACEUTICALS:  30.2 mCi  Technetium-31m DTPA aerosol inhalation and 4.2 mCi Technetium-71m MAA IV COMPARISON:  Portable chest obtained earlier today. FINDINGS: Ventilation: Horizontally oriented linear ventilation defect in the superior segment of the right lower lobe. No corresponding abnormality on the portable chest radiograph obtained earlier today. Perfusion: Horizontally oriented linear perfusion defect in the superior segment of the right lower lobe, corresponding to the ventilation defect. Otherwise, normal perfusion of both lungs. IMPRESSION: Intermediate probability for pulmonary embolism. If there are no contraindications, a CT angiogram of the chest is recommended. Electronically Signed   By: Claudie Revering M.D.   On: 11/20/2016 18:51   Dg Chest Port 1 View  Result Date: 11/20/2016 CLINICAL DATA:  Tachycardia, weakness EXAM: PORTABLE CHEST 1 VIEW COMPARISON:  10/23/2015 FINDINGS: Low lung volumes and slight rotation to the left. Minor basilar atelectasis. Normal heart size and vascularity. No focal pneumonia, collapse or consolidation. Negative for edema, effusion or pneumothorax. Trachea is midline. Degenerative changes of the spine. Remote cholecystectomy noted. IMPRESSION: Low volume chest exam with basilar atelectasis. Negative for acute pneumonia or CHF. Electronically Signed   By: Jerilynn Mages.  Shick M.D.   On: 11/20/2016 11:56    Assessment and Plan:   1. Atrial Fibrillation w/ RVR: pt with known h/o PAF but not on Dilkon due to his alcoholism and medical noncompliance. His RVR is in the setting of dehydration from poor PO intake and medication noncompliance with his beta blocker. Pt reports he ran out of his meds about 1 week ago. He got hydration w/ IVFS and SCr is improving. Fluids currently on hold given his systolic HF. Lasix also on hold given his dehydration. He Coreg was restarted and increased by IM to 25 mg BID. I've stress importance of medication compliance with pt. Continue to monitor on telemetry.     Signed, Lyda Jester, PA-C  11/21/2016 5:28 PM

## 2016-11-21 NOTE — Progress Notes (Signed)
ANTICOAGULATION CONSULT NOTE - Follow Up Consult  Pharmacy Consult for Heparin  Indication: atrial fibrillation  No Known Allergies  Patient Measurements: Weight: 297 lb 4.8 oz (134.9 kg)  Vital Signs: Temp: 98.6 F (37 C) (08/04 0534) Temp Source: Oral (08/04 0534) BP: 117/94 (08/04 0534) Pulse Rate: 127 (08/04 0534)  Labs:  Recent Labs  11/20/16 1140 11/20/16 1207 11/21/16 0341  HGB 12.8* 13.3 10.8*  HCT 36.6* 39.0 31.5*  PLT 83*  --  77*  LABPROT 13.2  --   --   INR 1.00  --   --   HEPARINUNFRC  --   --  <0.10*  CREATININE 2.00* 1.80* 1.63*    Estimated Creatinine Clearance: 65.9 mL/min (A) (by C-G formula based on SCr of 1.63 mg/dL (H)).   Assessment: Heparin for afib/RVR, heparin level undetectable, no issues per RN.  Goal of Therapy:  Heparin level 0.3-0.7 units/ml Monitor platelets by anticoagulation protocol: Yes   Plan:  Heparin 3000 units [employer] heparin drip to 1700 units/hr 1300 HL  Ziare Orrick 11/21/2016,5:38 AM

## 2016-11-21 NOTE — Progress Notes (Signed)
Initial Nutrition Assessment  DOCUMENTATION CODES:   Morbid obesity  INTERVENTION:   Encourage PO intake at meals.    NUTRITION DIAGNOSIS:   Inadequate oral intake related to acute illness as evidenced by per patient/family report.  GOAL:   Patient will meet greater than or equal to 90% of their needs  MONITOR:   PO intake, I & O's  REASON FOR ASSESSMENT:   Malnutrition Screening Tool    ASSESSMENT:   Pt with hx of chronic AF (non-compliant), alcohol liver dz, CHF, HTN admitted with AF with RVR and frequent falls.    Pt reports noncompliance with medication due to running out and not having any refills. States money is not an issue.  Lives with mom. He does not follow any diet at home. Reports that he goes out to eat for lunch and sometimes dinner. At lunch he usually gets extra food and brings it home.  Pt endorses weight loss and decreased appetite. He states intake has not really changed. Breakfast: never, Lunch: cheeseburger or chicken breast veggies, dinner leftovers or meal mom makes.  He drinks 2 - 22 oz beers every day usually starting at lunch time. States he is not going to drink these anymore.  He then reports that he thinks he got in trouble because he ate only tomatoes for all meals for a few days. He was just in the mood for them.   Per pt his usual weight is 312 lb (used to be smaller but gained weight due to hip issues). He has lost down to 297 lb for a total weight loss of 15 lb or 4.8% of total weight.  Nutrition-Focused physical exam completed. Findings are no fat depletion, no muscle depletion, and moderate edema to BLE.  Pt does have some lose hang skin at tricep area.   Pt would benefit from weight loss and heart healthy diet instruction but declines at this time.  Meal Completion: 40-80% Last meal intake 80%. Pt did not eat the bread on the tray because he does not usually eat bread per pt. Reviewed menu with pt. Encouraged PO intake of healthy diet.     Medications reviewed and include: folvite, MVI, thiamine  Labs reviewed: Na 128 (L)   Diet Order:  Diet Carb Modified Fluid consistency: Thin; Room service appropriate? Yes  Skin:  Reviewed, no issues  Last BM:  8/3  Height:   Ht Readings from Last 1 Encounters:  11/21/16 6' (1.829 m)    Weight:   Wt Readings from Last 1 Encounters:  11/20/16 297 lb 4.8 oz (134.9 kg)    Ideal Body Weight:  80.9 kg  BMI:  Body mass index is 40.32 kg/m.  Estimated Nutritional Needs:   Kcal:  2100-2300  Protein:  110-130 grams  Fluid:  >/= 2.1 L/day  EDUCATION NEEDS:   Education needs addressed  Maylon Peppers RD, [place], [place] Pager [phone] After Hours Pager

## 2016-11-22 ENCOUNTER — Inpatient Hospital Stay (HOSPITAL_COMMUNITY): Payer: 59

## 2016-11-22 DIAGNOSIS — R000 Tachycardia, unspecified: Secondary | ICD-10-CM

## 2016-11-22 DIAGNOSIS — R531 Weakness: Secondary | ICD-10-CM

## 2016-11-22 DIAGNOSIS — R4781 Slurred speech: Secondary | ICD-10-CM

## 2016-11-22 LAB — VAS US CAROTID
LCCADDIAS: -19 cm/s
LCCADSYS: -58 cm/s
LCCAPSYS: 46 cm/s
LEFT ECA DIAS: -12 cm/s
LEFT VERTEBRAL DIAS: -6 cm/s
LICAPSYS: -46 cm/s
Left CCA prox dias: 15 cm/s
Left ICA dist dias: -29 cm/s
Left ICA dist sys: -58 cm/s
Left ICA prox dias: -20 cm/s
RCCADSYS: -54 cm/s
RIGHT ECA DIAS: -17 cm/s
RIGHT VERTEBRAL DIAS: 17 cm/s
Right CCA prox dias: -24 cm/s
Right CCA prox sys: -73 cm/s

## 2016-11-22 LAB — LIPID PANEL
Cholesterol: 131 mg/dL (ref 0–200)
HDL: 64 mg/dL (ref >40)
LDL CALC: 51 mg/dL (ref 0–99)
TRIGLYCERIDES: 78 mg/dL (ref <150)
Total CHOL/HDL Ratio: 2 RATIO
VLDL: 16 mg/dL (ref 0–40)

## 2016-11-22 LAB — HEMOGLOBIN A1C
HEMOGLOBIN A1C: 5 % (ref 4.8–5.6)
MEAN PLASMA GLUCOSE: 97 mg/dL

## 2016-11-22 LAB — CBC
HEMATOCRIT: 30.7 % — AB (ref 39.0–52.0)
Hemoglobin: 10.7 g/dL — ABNORMAL LOW (ref 13.0–17.0)
MCH: 37.4 pg — AB (ref 26.0–34.0)
MCHC: 34.9 g/dL (ref 30.0–36.0)
MCV: 107.3 fL — ABNORMAL HIGH (ref 78.0–100.0)
Platelets: 73 10*3/uL — ABNORMAL LOW (ref 150–400)
RBC: 2.86 MIL/uL — ABNORMAL LOW (ref 4.22–5.81)
RDW: 13.9 % (ref 11.5–15.5)
WBC: 4 10*3/uL (ref 4.0–10.5)

## 2016-11-22 LAB — BASIC METABOLIC PANEL
Anion gap: 12 (ref 5–15)
BUN: 15 mg/dL (ref 6–20)
CALCIUM: 8.5 mg/dL — AB (ref 8.9–10.3)
CHLORIDE: 89 mmol/L — AB (ref 101–111)
CO2: 24 mmol/L (ref 22–32)
CREATININE: 1.58 mg/dL — AB (ref 0.61–1.24)
GFR calc non Af Amer: 45 mL/min — ABNORMAL LOW (ref >60)
GFR, EST AFRICAN AMERICAN: 52 mL/min — AB (ref >60)
Glucose, Bld: 92 mg/dL (ref 65–99)
Potassium: 3.9 mmol/L (ref 3.5–5.1)
SODIUM: 125 mmol/L — AB (ref 135–145)

## 2016-11-22 LAB — NA AND K (SODIUM & POTASSIUM), RAND UR
POTASSIUM UR: 55 mmol/L
Sodium, Ur: 12 mmol/L

## 2016-11-22 LAB — OSMOLALITY, URINE: OSMOLALITY UR: 600 mosm/kg (ref 300–900)

## 2016-11-22 MED ORDER — POLYETHYLENE GLYCOL 3350 17 G PO PACK
17.0000 g | PACK | Freq: Every day | ORAL | Status: DC
  Administered 2016-11-22 – 2016-11-24 (×3): 17 g via ORAL
  Filled 2016-11-22 (×4): qty 1

## 2016-11-22 MED ORDER — METOPROLOL TARTRATE 12.5 MG HALF TABLET
12.5000 mg | ORAL_TABLET | Freq: Two times a day (BID) | ORAL | Status: DC
  Administered 2016-11-22 – 2016-11-23 (×2): 12.5 mg via ORAL
  Filled 2016-11-22 (×2): qty 1

## 2016-11-22 NOTE — Evaluation (Signed)
Physical Therapy Evaluation Patient Details Name: Thomas [name] MRN: [id number] DOB: 05/28/1950 Today's Date: 11/22/2016   History of Present Illness   43M with EtOH abuse, chronic systolic and diastolic heart failure, dilated cardiomyopathy secondary alcohol abuse, hypertension, pancytopenia secondary to alcoholism, and paroxysmal atrial fibrillation admitted with atrial fibrillation with rapid ventricular response. This occurred in the setting of noncompliance and not taking any medications over the period of one week. His heart rate and blood pressure remained poorly-controlled on carvedilol  Clinical Impression   Pt admitted with above diagnosis. Patient is vague when discussing how his falls have occurred. Initially denied related to dizziness or fainting, however as his BP began to drop and he became dizzy during session, he reported this is what happens at home. Pt currently with functional limitations due to the deficits listed below (see PT Problem List). Progress with mobility will be dependent on BP stabilizing. Pt will benefit from skilled PT to increase their independence and safety with mobility to allow discharge to the venue listed below.       Follow Up Recommendations Home health PT    Equipment Recommendations  None recommended by PT    Recommendations for Other Services OT consult     Precautions / Restrictions Precautions Precautions: Fall Precaution Comments: reports he has fallen 3 times in 4 weeks; "slip/trip" denies dizzy or fainting      Mobility  Bed Mobility Overal bed mobility: Modified Independent                Transfers Overall transfer level: Needs assistance Equipment used: Rolling walker (2 wheeled) Transfers: Sit to/from [facility] Sit to Stand: Min guard Stand pivot transfers: Min guard (no device)       General transfer comment: minguard for safety; vc for safe use of RW  Ambulation/Gait Ambulation/Gait  assistance: Min guard Ambulation Distance (Feet): 100 Feet Assistive device: Rolling walker (2 wheeled) Gait Pattern/deviations: Step-through pattern;Wide base of support;Shuffle     General Gait Details: denied dizziness throughout  Stairs            Wheelchair Mobility    Modified Rankin (Stroke Patients Only)       Balance Overall balance assessment: History of Falls;Needs assistance         Standing balance support: No upper extremity supported Standing balance-Leahy Scale: Fair                               Pertinent Vitals/Pain Pain Assessment: No/denies pain    Home Living Family/patient expects to be discharged to:: Private residence Living Arrangements: Parent Available Help at Discharge: Family Type of Home: House Home Access: Stairs to enter Entrance Stairs-Rails: Right Entrance Stairs-Number of Steps: 3 Home Layout: One level Home Equipment: Environmental consultant - 2 wheels;Cane - single point;Bedside commode;Wheelchair - manual      Prior Function Level of Independence: Independent with assistive device(s)         Comments: uses cane more      Hand Dominance        Extremity/Trunk Assessment   Upper Extremity Assessment Upper Extremity Assessment: Generalized weakness    Lower Extremity Assessment Lower Extremity Assessment: Generalized weakness (Rt DF 5/5, Lt DF 3+)    Cervical / Trunk Assessment Cervical / Trunk Assessment: Other exceptions Cervical / Trunk Exceptions: morbid obesity  Communication   Communication: No difficulties  Cognition Arousal/Alertness: Awake/alert Behavior During Therapy: WFL for tasks assessed/performed Overall Cognitive  Status: History of cognitive impairments - at baseline                                 General Comments: decr safety awareness       General Comments General comments (skin integrity, edema, etc.): While reclined in chair, began reporting increasing dizziness     Exercises     Assessment/Plan    PT Assessment Patient needs continued PT services  PT Problem List Decreased strength;Decreased activity tolerance;Decreased mobility;Decreased knowledge of use of DME;Decreased safety awareness;Cardiopulmonary status limiting activity;Obesity       PT Treatment Interventions DME instruction;Gait training;Stair training;Functional mobility training;Therapeutic activities;Therapeutic exercise;Balance training;Cognitive remediation;Patient/family education    PT Goals (Current goals can be found in the Care Plan section)  Acute Rehab PT Goals Patient Stated Goal: be less dizzy and able to use the car he recently got PT Goal Formulation: With patient Time For Goal Achievement: 12/06/16 Potential to Achieve Goals: Fair    Frequency Min 3X/week   Barriers to discharge        Co-evaluation               AM-PAC PT "6 Clicks" Daily Activity  Outcome Measure Difficulty turning over in bed (including adjusting bedclothes, sheets and blankets)?: A Little Difficulty moving from lying on back to sitting on the side of the bed? : A Little Difficulty sitting down on and standing up from a chair with arms (e.g., wheelchair, bedside commode, etc,.)?: A Little Help needed moving to and from a bed to chair (including a wheelchair)?: A Little Help needed walking in hospital room?: A Little Help needed climbing 3-5 steps with a railing? : A Little 6 Click Score: 18    End of Session Equipment Utilized During Treatment: Gait belt Activity Tolerance: Treatment limited secondary to medical complications (Comment) (decr BP (see Vitals)) Patient left: in bed;with call bell/phone within reach Nurse Communication: Mobility status;Other (comment) (decr BP; RN in to assess) PT Visit Diagnosis: Other abnormalities of gait and mobility (R26.89);Repeated falls (R29.6);Muscle weakness (generalized) (M62.81);Dizziness and giddiness (R42)    Time: 1027-2536 PT Time  Calculation (min) (ACUTE ONLY): 33 min   Charges:   PT Evaluation $PT Eval High Complexity: 1 High     PT G Codes:          [employer], PT 11/22/2016, 10:38 AM

## 2016-11-22 NOTE — Progress Notes (Signed)
VASCULAR LAB PRELIMINARY  PRELIMINARY  PRELIMINARY  PRELIMINARY  Carotid duplex completed.    Preliminary report:  1-39% ICA plaquing. Vertebral artery flow is antegrade.   Josede Cicero, RVT 11/22/2016, 1:05 PM

## 2016-11-22 NOTE — Plan of Care (Signed)
Problem: Safety: Goal: Ability to remain free from injury will improve Outcome: Progressing Up with one assist and walker, bed and chair alarm on   Problem: Physical Regulation: Goal: Ability to maintain clinical measurements within normal limits will improve Outcome: Progressing Heart rate improved in the 90's; bp continues to fluctuate   Problem: Tissue Perfusion: Goal: Risk factors for ineffective tissue perfusion will decrease Outcome: Progressing Remains 100% oxygen saturation on room air   Problem: Activity: Goal: Risk for activity intolerance will decrease Outcome: Not Progressing Patient still with difficulty getting up at times d/t dizziness and low BP; other times will tolerate getting up without problem   Problem: Fluid Volume: Goal: Ability to maintain a balanced intake and output will improve Outcome: Progressing 1200 ml fluid restriction started today

## 2016-11-22 NOTE — Progress Notes (Signed)
Progress Note  Patient Name: Thomas [name] Date of Encounter: 11/22/2016  Primary Cardiologist: Dr. Radford Pax  Subjective   Feels better  Inpatient Medications    Scheduled Meds: . aspirin EC  81 mg Oral BID  . enoxaparin (LOVENOX) injection  40 mg Subcutaneous Q24H  . folic acid  1 mg Oral Daily  . methocarbamol  500 mg Oral BID WC  . metoprolol tartrate  12.5 mg Oral BID  . multivitamin with minerals  1 tablet Oral Daily  . sodium chloride flush  3 mL Intravenous Q12H  . thiamine  100 mg Oral Daily   Continuous Infusions:  PRN Meds: acetaminophen **OR** acetaminophen, HYDROcodone-acetaminophen, lidocaine, metoprolol tartrate, ondansetron **OR** ondansetron (ZOFRAN) IV, technetium TC 66M diethylenetriame-pentaacetic acid, zolpidem   Vital Signs    Vitals:   11/22/16 1009 11/22/16 1011 11/22/16 1014 11/22/16 1020  BP: (!) 70/45 (!) 76/50 (!) 70/49 (!) 64/42  Pulse: (!) 118     Resp:      Temp:      TempSrc:      SpO2:      Weight:      Height:        Intake/Output Summary (Last 24 hours) at 11/22/16 1100 Last data filed at 11/22/16 0902  Gross per 24 hour  Intake              660 ml  Output              600 ml  Net               60 ml   Filed Weights   11/20/16 2228 11/22/16 0604  Weight: 297 lb 4.8 oz (134.9 kg) 297 lb 9.6 oz (135 kg)    Telemetry    AFib, rate controlled - Personally Reviewed  ECG    8/3 AFib with RVR - Personally Reviewed  Physical Exam   GEN: No acute distress.   Neck: No JVD Cardiac: Irregularly irregular, S1 S2, no murmurs, rubs, or gallops.  Respiratory: Clear to auscultation bilaterally. GI: Soft, nontender, non-distended  MS: No edema; No deformity. Neuro:  Nonfocal  Psych: Normal affect   Labs    Chemistry Recent Labs Lab 11/20/16 1140 11/20/16 1207 11/21/16 0341 11/22/16 0344  NA 127* 126* 128* 125*  K 5.0 4.8 3.7 3.9  CL 89* 88* 93* 89*  CO2 19*  --  25 24  GLUCOSE 132* 125* 98 92  BUN 11 14 12 15     CREATININE 2.00* 1.80* 1.63* 1.58*  CALCIUM 9.2  --  8.4* 8.5*  PROT 6.9  --   --   --   ALBUMIN 3.5  --   --   --   AST 219*  --   --   --   ALT 99*  --   --   --   ALKPHOS 90  --   --   --   BILITOT 5.5*  --   --   --   GFRNONAA 34*  --  43* 45*  GFRAA 39*  --  50* 52*  ANIONGAP 19*  --  10 12     Hematology Recent Labs Lab 11/20/16 1140 11/20/16 1207 11/21/16 0341 11/22/16 0344  WBC 5.7  --  4.7 4.0  RBC 3.42*  --  2.90* 2.86*  HGB 12.8* 13.3 10.8* 10.7*  HCT 36.6* 39.0 31.5* 30.7*  MCV 107.0*  --  108.6* 107.3*  MCH 37.4*  --  37.2* 37.4*  MCHC  35.0  --  34.3 34.9  RDW 14.1  --  14.3 13.9  PLT 83*  --  77* 73*    Cardiac EnzymesNo results for input(s): TROPONINI in the last 168 hours.  Recent Labs Lab 11/20/16 1205 11/20/16 1548  TROPIPOC 0.02 0.02     BNPNo results for input(s): BNP, PROBNP in the last 168 hours.   DDimer  Recent Labs Lab 11/20/16 1543  DDIMER 1.28*     Radiology    Ct Head Wo Contrast  Result Date: 11/21/2016 CLINICAL DATA:  66 year old male with recurrent fall, generalized weakness, slurred speech. EXAM: CT HEAD WITHOUT CONTRAST TECHNIQUE: Contiguous axial images were obtained from the base of the skull through the vertex without intravenous contrast. COMPARISON:  Neck CT 08/21/2013.  Brain MRI 08/20/2004. FINDINGS: Brain: Cerebral volume loss since 2006 appears generalized. Patchy and confluent white matter hypodensity which appears progressed since the prior MRI. No midline shift, ventriculomegaly, mass effect, evidence of mass lesion, intracranial hemorrhage or evidence of cortically based acute infarction. No cortical encephalomalacia. Vascular: Calcified atherosclerosis at the skull base. No suspicious intracranial vascular hyperdensity. Skull: Intact.  No acute osseous abnormality identified. Sinuses/Orbits: Clear aside from mild maxillary sinus mucosal thickening. Hyperplastic sinuses. Tympanic cavities and mastoids are clear. Other:  No scalp hematoma identified. Visualized orbit soft tissues are within normal limits. IMPRESSION: 1.  No acute intracranial abnormality. 2. Generalized cerebral volume loss and progressed nonspecific white matter changes since 2006. Electronically Signed   By: Genevie Ann M.D.   On: 11/21/2016 20:08   Nm Pulmonary Vent And Perf (v/q Scan)  Result Date: 11/20/2016 CLINICAL DATA:  Shortness of breath for the past 3 days following a fall. Positive D-dimer. EXAM: NUCLEAR MEDICINE VENTILATION - PERFUSION LUNG SCAN TECHNIQUE: Ventilation images were obtained in multiple projections using inhaled aerosol Tc-70m DTPA. Perfusion images were obtained in multiple projections after intravenous injection of Tc-54m MAA. RADIOPHARMACEUTICALS:  30.2 mCi Technetium-32m DTPA aerosol inhalation and 4.2 mCi Technetium-57m MAA IV COMPARISON:  Portable chest obtained earlier today. FINDINGS: Ventilation: Horizontally oriented linear ventilation defect in the superior segment of the right lower lobe. No corresponding abnormality on the portable chest radiograph obtained earlier today. Perfusion: Horizontally oriented linear perfusion defect in the superior segment of the right lower lobe, corresponding to the ventilation defect. Otherwise, normal perfusion of both lungs. IMPRESSION: Intermediate probability for pulmonary embolism. If there are no contraindications, a CT angiogram of the chest is recommended. Electronically Signed   By: Claudie Revering M.D.   On: 11/20/2016 18:51   Dg Chest Port 1 View  Result Date: 11/20/2016 CLINICAL DATA:  Tachycardia, weakness EXAM: PORTABLE CHEST 1 VIEW COMPARISON:  10/23/2015 FINDINGS: Low lung volumes and slight rotation to the left. Minor basilar atelectasis. Normal heart size and vascularity. No focal pneumonia, collapse or consolidation. Negative for edema, effusion or pneumothorax. Trachea is midline. Degenerative changes of the spine. Remote cholecystectomy noted. IMPRESSION: Low volume chest  exam with basilar atelectasis. Negative for acute pneumonia or CHF. Electronically Signed   By: Jerilynn Mages.  Shick M.D.   On: 11/20/2016 11:56    Cardiac Studies   EF 40-45%  Patient Profile     66 y.o. male with noncompliance and AFib  Assessment & Plan    AFib: rate control has improved.  I am not sure why carvedilol was changed to low dose metoprolol.  Rate control may be limited by a low BP reading. Currently, he shows no sx of hypotension. Compliance will be his most important  issue.    May have been dehydrated when he came in as well causing falls.  COntinue to monitor.    SignedLarae Grooms, MD  11/22/2016, 11:00 AM

## 2016-11-22 NOTE — Progress Notes (Addendum)
PROGRESS NOTE    Thomas [name]  JOI:[id number] DOB: [birth year] DOA: 11/20/2016 PCP: Hoyt Koch, MD   Brief Narrative: 66 year old male with history of chronic atrial fibrillation not on anticoagulation as per prior cardiology note, alcohol liver disease, chronic thrombocytopenia, chronic systolic congestive heart failure, hypertension presented with slurred, generalized weakness and frequent falls. Patient denied chest pain or shortness of breath. In the ER patient was found to have A. fib with RVR and admitted for further evaluation. VQ scan was indeterminate, patient is asymptomatic and no hypoxia.  Assessment & Plan:   Active Problems:   Atrial fibrillation with RVR ([name])   Slurred speech  # Atrial fibrillation with rapid ventricular response: May have been contributed by noncompliance. Chest x-ray unremarkable.  -Patient's heart rate is elevated with low blood pressure systolic around 47-09G. I discontinue Coreg and started low metoprolol for rate control if tolerated by BP. Monitor blood pressure closely. Echocardiogram with EF of 40-45% unchanged from before. Cardiology consult appreciated. Not on anticoagulation because of thrombocytopenia, liver disease, history of noncompliance.   #Chronic systolic congestive heart failure: Last Echo in 2016 with EF of 40-45%. Looks euvolemic on exam. Lasix on hold because of hypotension.  # Slurry speech, recurrent fall and generalized weakness unknown if it is contributed by alcohol abuse: -Echocardiogram unchanged -No orthostatic hypotension -CT scan of head with no acute finding. Patient has generalized cerebral volume loss. -Carotid ultrasound with no critical stenosis -LDL 51 -A1c 5 -PT OT evaluation and supportive care.  #Hypotension: Holding home medication including losartan, Lasix. On metoprolol for rate control.  # Acute injury on chronic  kidney disease stage III: Likely in the setting of A. fib with RVR. Serum  creatinine level trending down. UA unremarkable. Patient does not have any urinary symptoms. Monitor BMP.  #Hyponatremia likely in the setting of liver disease; place on a fluid restriction, check urinary sodium osmolality. Monitor BMP. Discussed with the patient and nurse. Lasix on hold.  #Alcohol abuse/alcohol related liver disease: No sign of withdrawal. Continue vitamins and supportive care.  #Thrombocytopenia likely in the setting of liver disease: No sign of bleeding.  DVT prophylaxis: SCD. DC Lovenox because of thrombocytopenia Code Status: Full code Family Communication: No family at bedside Disposition Plan: Likely discharge home in 1-2 days    Consultants:   None  Procedures: None Antimicrobials: None  Subjective: Seen and examined at bedside. Denied headache, dizziness, nausea vomiting chest pain or shortness of breath.  Objective: Vitals:   11/22/16 1011 11/22/16 1014 11/22/16 1020 11/22/16 1217  BP: (!) 76/50 (!) 70/49 (!) 64/42 110/77  Pulse:    88  Resp:    20  Temp:    98.3 F (36.8 C)  TempSrc:    Oral  SpO2:    100%  Weight:      Height:        Intake/Output Summary (Last 24 hours) at 11/22/16 1329 Last data filed at 11/22/16 1324  Gross per 24 hour  Intake              840 ml  Output              700 ml  Net              140 ml   Filed Weights   11/20/16 2228 11/22/16 0604  Weight: 134.9 kg (297 lb 4.8 oz) 135 kg (297 lb 9.6 oz)    Examination:  General exam: Not in distress Respiratory system: Clear bilaterally,  respiratory effort normal no wheezing or crackle Cardiovascular system: Regular rate rhythm, S1 is normal. No edema Gastrointestinal system: Abdomen is nondistended, soft and nontender. Normal bowel sounds heard. Central nervous system: Alert and oriented. No focal neurological deficits. Skin: No rashes, lesions or ulcers Psychiatry: Judgement and insight appear normal. Mood & affect appropriate.     Data Reviewed: I have  personally reviewed following labs and imaging studies  CBC:  Recent Labs Lab 11/20/16 1140 11/20/16 1207 11/21/16 0341 11/22/16 0344  WBC 5.7  --  4.7 4.0  NEUTROABS 4.6  --   --   --   HGB 12.8* 13.3 10.8* 10.7*  HCT 36.6* 39.0 31.5* 30.7*  MCV 107.0*  --  108.6* 107.3*  PLT 83*  --  77* 73*   Basic Metabolic Panel:  Recent Labs Lab 11/20/16 1140 11/20/16 1207 11/21/16 0341 11/22/16 0344  NA 127* 126* 128* 125*  K 5.0 4.8 3.7 3.9  CL 89* 88* 93* 89*  CO2 19*  --  25 24  GLUCOSE 132* 125* 98 92  BUN 11 14 12 15   CREATININE 2.00* 1.80* 1.63* 1.58*  CALCIUM 9.2  --  8.4* 8.5*   GFR: Estimated Creatinine Clearance: 68.1 mL/min (A) (by C-G formula based on SCr of 1.58 mg/dL (H)). Liver Function Tests:  Recent Labs Lab 11/20/16 1140  AST 219*  ALT 99*  ALKPHOS 90  BILITOT 5.5*  PROT 6.9  ALBUMIN 3.5   No results for input(s): LIPASE, AMYLASE in the last 168 hours. No results for input(s): AMMONIA in the last 168 hours. Coagulation Profile:  Recent Labs Lab 11/20/16 1140  INR 1.00   Cardiac Enzymes: No results for input(s): CKTOTAL, CKMB, CKMBINDEX, TROPONINI in the last 168 hours. BNP (last 3 results) No results for input(s): PROBNP in the last 8760 hours. HbA1C:  Recent Labs  11/21/16 0352  HGBA1C 5.0   CBG: No results for input(s): GLUCAP in the last 168 hours. Lipid Profile:  Recent Labs  11/22/16 0344  CHOL 131  HDL 64  LDLCALC 51  TRIG 78  CHOLHDL 2.0   Thyroid Function Tests:  Recent Labs  11/21/16 0341  TSH 4.023   Anemia Panel: No results for input(s): VITAMINB12, FOLATE, FERRITIN, TIBC, IRON, RETICCTPCT in the last 72 hours. Sepsis Labs: No results for input(s): PROCALCITON, LATICACIDVEN in the last 168 hours.  Recent Results (from the past 240 hour(s))  MRSA PCR Screening     Status: None   Collection Time: 11/20/16 10:16 PM  Result Value Ref Range Status   MRSA by PCR NEGATIVE NEGATIVE Final    Comment:          The GeneXpert MRSA Assay (FDA approved for NASAL specimens only), is one component of a comprehensive MRSA colonization surveillance program. It is not intended to diagnose MRSA infection nor to guide or monitor treatment for MRSA infections.          Radiology Studies: Ct Head Wo Contrast  Result Date: 11/21/2016 CLINICAL DATA:  66 year old male with recurrent fall, generalized weakness, slurred speech. EXAM: CT HEAD WITHOUT CONTRAST TECHNIQUE: Contiguous axial images were obtained from the base of the skull through the vertex without intravenous contrast. COMPARISON:  Neck CT 08/21/2013.  Brain MRI 08/20/2004. FINDINGS: Brain: Cerebral volume loss since 2006 appears generalized. Patchy and confluent white matter hypodensity which appears progressed since the prior MRI. No midline shift, ventriculomegaly, mass effect, evidence of mass lesion, intracranial hemorrhage or evidence of cortically based acute infarction. No cortical  encephalomalacia. Vascular: Calcified atherosclerosis at the skull base. No suspicious intracranial vascular hyperdensity. Skull: Intact.  No acute osseous abnormality identified. Sinuses/Orbits: Clear aside from mild maxillary sinus mucosal thickening. Hyperplastic sinuses. Tympanic cavities and mastoids are clear. Other: No scalp hematoma identified. Visualized orbit soft tissues are within normal limits. IMPRESSION: 1.  No acute intracranial abnormality. 2. Generalized cerebral volume loss and progressed nonspecific white matter changes since 2006. Electronically Signed   By: Genevie Ann M.D.   On: 11/21/2016 20:08   Nm Pulmonary Vent And Perf (v/q Scan)  Result Date: 11/20/2016 CLINICAL DATA:  Shortness of breath for the past 3 days following a fall. Positive D-dimer. EXAM: NUCLEAR MEDICINE VENTILATION - PERFUSION LUNG SCAN TECHNIQUE: Ventilation images were obtained in multiple projections using inhaled aerosol Tc-22m DTPA. Perfusion images were obtained in multiple  projections after intravenous injection of Tc-53m MAA. RADIOPHARMACEUTICALS:  30.2 mCi Technetium-61m DTPA aerosol inhalation and 4.2 mCi Technetium-37m MAA IV COMPARISON:  Portable chest obtained earlier today. FINDINGS: Ventilation: Horizontally oriented linear ventilation defect in the superior segment of the right lower lobe. No corresponding abnormality on the portable chest radiograph obtained earlier today. Perfusion: Horizontally oriented linear perfusion defect in the superior segment of the right lower lobe, corresponding to the ventilation defect. Otherwise, normal perfusion of both lungs. IMPRESSION: Intermediate probability for pulmonary embolism. If there are no contraindications, a CT angiogram of the chest is recommended. Electronically Signed   By: Claudie Revering M.D.   On: 11/20/2016 18:51        Scheduled Meds: . aspirin EC  81 mg Oral BID  . enoxaparin (LOVENOX) injection  40 mg Subcutaneous Q24H  . folic acid  1 mg Oral Daily  . methocarbamol  500 mg Oral BID WC  . metoprolol tartrate  12.5 mg Oral BID  . multivitamin with minerals  1 tablet Oral Daily  . sodium chloride flush  3 mL Intravenous Q12H  . thiamine  100 mg Oral Daily   Continuous Infusions:   LOS: 2 days    Jovonna Nickell Tanna Furry, MD [facility] Pager [phone]  If 7PM-7AM, please contact night-coverage [email or web address] Password [email or web address] 11/22/2016, 1:29 PM

## 2016-11-22 NOTE — Progress Notes (Signed)
Pt. BP 89/60, HR 101. On call for [facility] made aware via text page.

## 2016-11-22 NOTE — Progress Notes (Signed)
Patient continues to have fluctuations in blood pressure, only one episode of dizziness early in the shift when he worked with physical therapy, BP lowest at that time.  RN did hold lopressor, MD notified regarding dizzy episode and holding of medication.  HR remains in the 90's to low 100's.  No BM since Friday, Miralax given as patient takes at home.    Overall patient says he feels improved, able to walk in room with walker.

## 2016-11-23 DIAGNOSIS — E871 Hypo-osmolality and hyponatremia: Secondary | ICD-10-CM

## 2016-11-23 DIAGNOSIS — I959 Hypotension, unspecified: Secondary | ICD-10-CM

## 2016-11-23 LAB — BASIC METABOLIC PANEL
ANION GAP: 9 (ref 5–15)
BUN: 15 mg/dL (ref 6–20)
CALCIUM: 8.7 mg/dL — AB (ref 8.9–10.3)
CO2: 25 mmol/L (ref 22–32)
CREATININE: 1.31 mg/dL — AB (ref 0.61–1.24)
Chloride: 92 mmol/L — ABNORMAL LOW (ref 101–111)
GFR, EST NON AFRICAN AMERICAN: 56 mL/min — AB (ref >60)
Glucose, Bld: 101 mg/dL — ABNORMAL HIGH (ref 65–99)
Potassium: 3.7 mmol/L (ref 3.5–5.1)
SODIUM: 126 mmol/L — AB (ref 135–145)

## 2016-11-23 LAB — CBC
HCT: 31.2 % — ABNORMAL LOW (ref 39.0–52.0)
Hemoglobin: 10.5 g/dL — ABNORMAL LOW (ref 13.0–17.0)
MCH: 35.8 pg — ABNORMAL HIGH (ref 26.0–34.0)
MCHC: 33.7 g/dL (ref 30.0–36.0)
MCV: 106.5 fL — ABNORMAL HIGH (ref 78.0–100.0)
PLATELETS: 85 10*3/uL — AB (ref 150–400)
RBC: 2.93 MIL/uL — ABNORMAL LOW (ref 4.22–5.81)
RDW: 13.9 % (ref 11.5–15.5)
WBC: 4 10*3/uL (ref 4.0–10.5)

## 2016-11-23 MED ORDER — METOPROLOL TARTRATE 25 MG PO TABS: 25.0000 mg | ORAL_TABLET | Freq: Once | ORAL | Status: DC

## 2016-11-23 MED ORDER — METOPROLOL TARTRATE 25 MG PO TABS
25.0000 mg | ORAL_TABLET | Freq: Three times a day (TID) | ORAL | Status: DC
  Administered 2016-11-24 (×2): 25 mg via ORAL
  Filled 2016-11-23 (×2): qty 1

## 2016-11-23 MED ORDER — METOPROLOL TARTRATE 25 MG PO TABS: 25.0000 mg | ORAL_TABLET | Freq: Two times a day (BID) | ORAL | Status: DC

## 2016-11-23 MED ORDER — METOPROLOL TARTRATE 25 MG PO TABS: 25.0000 mg | ORAL_TABLET | Freq: Three times a day (TID) | ORAL | Status: DC

## 2016-11-23 MED ORDER — METOPROLOL TARTRATE 12.5 MG HALF TABLET
12.5000 mg | ORAL_TABLET | Freq: Once | ORAL | Status: AC
  Administered 2016-11-23: 12.5 mg via ORAL
  Filled 2016-11-23: qty 1

## 2016-11-23 NOTE — Progress Notes (Addendum)
Progress Note  Patient Name: Thomas [name] Date of Encounter: 11/23/2016  Primary Cardiologist: Dr. Radford Pax  Subjective   Feeling well. Ambulated without chest pain or shortness of breath.    Inpatient Medications    Scheduled Meds: . aspirin EC  81 mg Oral BID  . folic acid  1 mg Oral Daily  . methocarbamol  500 mg Oral BID WC  . metoprolol tartrate  12.5 mg Oral BID  . multivitamin with minerals  1 tablet Oral Daily  . polyethylene glycol  17 g Oral Daily  . sodium chloride flush  3 mL Intravenous Q12H  . thiamine  100 mg Oral Daily   Continuous Infusions:  PRN Meds: acetaminophen **OR** acetaminophen, HYDROcodone-acetaminophen, lidocaine, metoprolol tartrate, ondansetron **OR** ondansetron (ZOFRAN) IV, technetium TC 64M diethylenetriame-pentaacetic acid, zolpidem   Vital Signs    Vitals:   11/22/16 1217 11/22/16 1832 11/22/16 2120 11/23/16 0459  BP: 110/77 (!) 90/56 121/75 117/76  Pulse: 88 97 87 (!) 101  Resp: 20  17   Temp: 98.3 F (36.8 C)  98.8 F (37.1 C) 98.9 F (37.2 C)  TempSrc: Oral  Oral Oral  SpO2: 100% 100% 100% 100%  Weight:    135 kg (297 lb 9.6 oz)  Height:        Intake/Output Summary (Last 24 hours) at 11/23/16 0847 Last data filed at 11/23/16 0815  Gross per 24 hour  Intake             1200 ml  Output              650 ml  Net              550 ml   Filed Weights   11/20/16 2228 11/22/16 0604 11/23/16 0459  Weight: 134.9 kg (297 lb 4.8 oz) 135 kg (297 lb 9.6 oz) 135 kg (297 lb 9.6 oz)    Telemetry    Atrial fibrillation.  Rates 80s-120s. No events. - Personally Reviewed  ECG    N/a - Personally Reviewed  Physical Exam   VS:  BP 117/76 (BP Location: Left Arm)   Pulse (!) 101   Temp 98.9 F (37.2 C) (Oral)   Resp 17   Ht 6' (1.829 m)   Wt 135 kg (297 lb 9.6 oz) Comment: a scale  SpO2 100%   BMI 40.36 kg/m  , BMI Body mass index is 40.36 kg/m. GENERAL:  Well appearing.  Laying flat in bed in no acute distress. HEENT:  Pupils equal round and reactive, fundi not visualized, oral mucosa unremarkable NECK:  No jugular venous distention, waveform within normal limits, carotid upstroke brisk and symmetric, no bruits, no thyromegaly LYMPHATICS:  No cervical adenopathy LUNGS:  Clear to auscultation bilaterally HEART:  Irregularly irregular.   PMI not displaced or sustained,S1 and S2 within normal limits, no S3, no S4, no clicks, no rubs, no murmurs ABD:  Flat, positive bowel sounds normal in frequency in pitch, no bruits, no rebound, no guarding, no midline pulsatile mass, no hepatomegaly, no splenomegaly EXT:  2 plus pulses throughout, no edema, no cyanosis no clubbing SKIN:  No rashes no nodules NEURO:  Cranial nerves II through XII grossly intact, motor grossly intact throughout PSYCH:  Cognitively intact, oriented to person place and time   Labs    Chemistry Recent Labs Lab 11/20/16 1140  11/21/16 0341 11/22/16 0344 11/23/16 0348  NA 127*  < > 128* 125* 126*  K 5.0  < > 3.7 3.9 3.7  CL 89*  < > 93* 89* 92*  CO2 19*  --  25 24 25   GLUCOSE 132*  < > 98 92 101*  BUN 11  < > 12 15 15   CREATININE 2.00*  < > 1.63* 1.58* 1.31*  CALCIUM 9.2  --  8.4* 8.5* 8.7*  PROT 6.9  --   --   --   --   ALBUMIN 3.5  --   --   --   --   AST 219*  --   --   --   --   ALT 99*  --   --   --   --   ALKPHOS 90  --   --   --   --   BILITOT 5.5*  --   --   --   --   GFRNONAA 34*  --  43* 45* 56*  GFRAA 39*  --  50* 52* >60  ANIONGAP 19*  --  10 12 9   < > = values in this interval not displayed.   Hematology Recent Labs Lab 11/21/16 0341 11/22/16 0344 11/23/16 0348  WBC 4.7 4.0 4.0  RBC 2.90* 2.86* 2.93*  HGB 10.8* 10.7* 10.5*  HCT 31.5* 30.7* 31.2*  MCV 108.6* 107.3* 106.5*  MCH 37.2* 37.4* 35.8*  MCHC 34.3 34.9 33.7  RDW 14.3 13.9 13.9  PLT 77* 73* 85*    Cardiac EnzymesNo results for input(s): TROPONINI in the last 168 hours.  Recent Labs Lab 11/20/16 1205 11/20/16 1548  TROPIPOC 0.02 0.02      BNPNo results for input(s): BNP, PROBNP in the last 168 hours.   DDimer  Recent Labs Lab 11/20/16 1543  DDIMER 1.28*     Radiology    Ct Head Wo Contrast  Result Date: 11/21/2016 CLINICAL DATA:  66 year old male with recurrent fall, generalized weakness, slurred speech. EXAM: CT HEAD WITHOUT CONTRAST TECHNIQUE: Contiguous axial images were obtained from the base of the skull through the vertex without intravenous contrast. COMPARISON:  Neck CT 08/21/2013.  Brain MRI 08/20/2004. FINDINGS: Brain: Cerebral volume loss since 2006 appears generalized. Patchy and confluent white matter hypodensity which appears progressed since the prior MRI. No midline shift, ventriculomegaly, mass effect, evidence of mass lesion, intracranial hemorrhage or evidence of cortically based acute infarction. No cortical encephalomalacia. Vascular: Calcified atherosclerosis at the skull base. No suspicious intracranial vascular hyperdensity. Skull: Intact.  No acute osseous abnormality identified. Sinuses/Orbits: Clear aside from mild maxillary sinus mucosal thickening. Hyperplastic sinuses. Tympanic cavities and mastoids are clear. Other: No scalp hematoma identified. Visualized orbit soft tissues are within normal limits. IMPRESSION: 1.  No acute intracranial abnormality. 2. Generalized cerebral volume loss and progressed nonspecific white matter changes since 2006. Electronically Signed   By: Genevie Ann M.D.   On: 11/21/2016 20:08    Cardiac Studies   Echo 11/21/16: Study Conclusions  - Left ventricle: The cavity size was normal. Systolic function was   mildly to moderately reduced. The estimated ejection fraction was   in the range of 40% to 45%. Diffuse hypokinesis. - Aortic valve: Transvalvular velocity was within the normal range.   There was no stenosis. There was no regurgitation. - Mitral valve: Transvalvular velocity was within the normal range.   There was no evidence for stenosis. There was mild  regurgitation. - Left atrium: The atrium was severely dilated. - Right ventricle: The cavity size was normal. Wall thickness was   normal. Systolic function was mildly reduced. - Tricuspid valve: There was mild  regurgitation. - Pulmonary arteries: Systolic pressure was within the normal   range. PA peak pressure: 31 mm Hg (S).  Carotid Doppler 11/22/16: 1-39% ICA stenosis bilaterally.  Patient Profile     Thomas [name] is a 23M with EtOH abuse, chronic systolic and diastolic heart failure, dilated cardiomyopathy secondary alcohol abuse, hypertension, pancytopenia secondary to alcoholism, and paroxysmal atrial fibrillation admitted with atrial fibrillation with rapid ventricular response.  Assessment & Plan   # Persistent atrial fibrillation: Heart rates remain poorly controlled.  Thomas [name] is not a candidate for anticoagulation 2/2 noncompliance, alcohlol abuse and thrombocytopenia. Carvedilol was switched to metoprolol for improved rate control in the setting of low blood pressures.  Rates remain poorly-controlled.  Increase metoprolol to 25mg  q8h.   # Chronic systolic and diastolic heart failure:  Euvolemic.  Home meds other than beta blocker are on hold.   # Hypertension: # Hypotension: Blood pressure has been low and his home antihypertensives other than beta blockers have been held. His was likely due to intravascular volume depletion on admission.  # Carotid stenosis: Carotid Dopplers this admission revealed mild ICA stenosis bilaterally.   echo was unchanged from prior.   # Acute on chronic kidney disease: Renal function continues to improve.  # Hyponatremia:  Suspect this is hypovolemic hyponatremia in the setting of low oral intake prior to admission. However, he has alcoholism and his low urine sodium and high urine Osms can also be seen with this condition.  Renal function is slowly improving with fluid restrictions.  However, given his persistent hypotension query whether a  gentle fluid bolus may improve his BP.    Signed, Skeet Latch, MD  11/23/2016, 8:47 AM

## 2016-11-23 NOTE — Progress Notes (Signed)
MD stated to give 12.5 mg of metoprolol, one time order MD aware of pt recent blood pressure, stated to still give   Drexler Maland Leory Plowman

## 2016-11-23 NOTE — Progress Notes (Signed)
Pt educated to sit at edge of bed and gain balance before standing. Pt educated to call RN or Nurse tech prior to ambulation. Pt educated on signs and symptoms of low blood pressure. Pt verbalized understanding

## 2016-11-23 NOTE — Progress Notes (Signed)
Paged cardiology regarding metoprolol order  Pt received dose at 0813, new order for metoprolol scheduled for 0945 Awaiting call back

## 2016-11-23 NOTE — Evaluation (Signed)
Occupational Therapy Evaluation Patient Details Name: Thomas [name] MRN: [id number] DOB: 1950-07-17 Today's Date: 11/23/2016    History of Present Illness  66M with EtOH abuse, chronic systolic and diastolic heart failure, dilated cardiomyopathy secondary alcohol abuse, hypertension, pancytopenia secondary to alcoholism, and paroxysmal atrial fibrillation admitted with atrial fibrillation with rapid ventricular response. This occurred in the setting of noncompliance and not taking any medications over the period of one week. His heart rate and blood pressure remained poorly-controlled on carvedilol   Clinical Impression   This 66 y/o M presents with the above. Pt lives with his mother, reports at baseline he is mod independent with ADLs and functional mobility. Pt currently requires MinA for functional mobility and Mod-MaxA for LB ADLs. During this session Pt's HR increased to 140's with OOB activity, returned to 120 and below after return to sitting within approx 30 seconds. Pt will benefit from continued OT services in acute and post acute settings to maximize Pt's safety and independence with ADLs and functional mobility.     Follow Up Recommendations  Home health OT    Equipment Recommendations  3 in 1 bedside commode           Precautions / Restrictions Precautions Precautions: Fall Precaution Comments: reports he has fallen 3 times in 4 weeks; "slip/trip" denies dizzy or fainting; monitor HR Restrictions Weight Bearing Restrictions: No      Mobility Bed Mobility Overal bed mobility: Needs Assistance Bed Mobility: Supine to Sit;Sit to Supine     Supine to sit: Supervision Sit to supine: Supervision   General bed mobility comments: for safety   Transfers Overall transfer level: Needs assistance Equipment used: Rolling walker (2 wheeled) Transfers: Sit to/from Stand Sit to Stand: Min assist         General transfer comment: assist to rise and steady; verbal cues  for safe RW use     Balance Overall balance assessment: History of Falls;Needs assistance Sitting-balance support: No upper extremity supported;Feet supported Sitting balance-Leahy Scale: Good     Standing balance support: No upper extremity supported Standing balance-Leahy Scale: Fair Standing balance comment: washing face standing at sink                            ADL either performed or assessed with clinical judgement   ADL Overall ADL's : Needs assistance/impaired Eating/Feeding: Set up;Sitting   Grooming: Wash/dry face;Min guard;Standing   Upper Body Bathing: Min guard;Sitting   Lower Body Bathing: Sit to/from stand;Moderate assistance   Upper Body Dressing : Min guard;Sitting   Lower Body Dressing: Maximal assistance;Sit to/from stand Lower Body Dressing Details (indicate cue type and reason): Pt reports he has a sock aide at home which he uses PRN  Toilet Transfer: Minimal assistance;Ambulation;Comfort height toilet;Grab bars;RW   Toileting- Clothing Manipulation and Hygiene: Minimal assistance;Sit to/from stand       Functional mobility during ADLs: Min guard;Rolling walker General ADL Comments: Pt requesting to complete hallway level functional mobility, ambulating approx 25 ft using RW, upon returning to room and standing at sink noted Pt HR to be in 140's, returned Pt to sitting with HR decreasing to 120 and below within approx 30 seconds      Vision Baseline Vision/History: Wears glasses (Reports he is supposed to wear at all times however doesn't always do so)                  Pertinent Vitals/Pain Pain Assessment: No/denies pain  Hand Dominance     Extremity/Trunk Assessment Upper Extremity Assessment Upper Extremity Assessment: Generalized weakness;RUE deficits/detail RUE Deficits / Details: limited shoulder AROM (shoulder flex approx 0-100*), PROM is [facility]   Lower Extremity Assessment Lower Extremity Assessment: Defer to PT  evaluation   Cervical / Trunk Assessment Cervical / Trunk Assessment: Other exceptions Cervical / Trunk Exceptions: morbid obesity   Communication Communication Communication: No difficulties   Cognition Arousal/Alertness: Awake/alert Behavior During Therapy: WFL for tasks assessed/performed Overall Cognitive Status: History of cognitive impairments - at baseline                                                      Home Living Family/patient expects to be discharged to:: Private residence Living Arrangements: Parent Available Help at Discharge: Family Type of Home: House Home Access: Stairs to enter           [employer] Shower/Tub: Teaching laboratory technician Toilet: Handicapped height Bathroom Accessibility: No   Home Equipment: Environmental consultant - 2 wheels;Cane - single point;Wheelchair - manual;Shower seat          Prior Functioning/Environment Level of Independence: Independent with assistive device(s)        Comments: uses cane more         OT Problem List: Decreased strength;Decreased activity tolerance;Decreased knowledge of use of DME or AE      OT Treatment/Interventions: Self-care/ADL training;DME and/or AE instruction;Therapeutic activities;Balance training;Therapeutic exercise;Energy conservation;Patient/family education    OT Goals(Current goals can be found in the care plan section) Acute Rehab OT Goals Patient Stated Goal: be less dizzy and able to use the car he recently got OT Goal Formulation: With patient Time For Goal Achievement: 12/07/16 Potential to Achieve Goals: Good  OT Frequency: Min 2X/week                             AM-PAC PT "6 Clicks" Daily Activity     Outcome Measure Help from another person eating meals?: None Help from another person taking care of personal grooming?: A Little Help from another person toileting, which includes using toliet, bedpan, or urinal?: A Little Help from another person  bathing (including washing, rinsing, drying)?: A Lot Help from another person to put on and taking off regular upper body clothing?: None Help from another person to put on and taking off regular lower body clothing?: A Lot 6 Click Score: 18   End of Session Equipment Utilized During Treatment: Gait belt;Rolling walker Nurse Communication: Mobility status;Other (comment) (HR sats )  Activity Tolerance: Patient tolerated treatment well Patient left: in bed;with call bell/phone within reach;with bed alarm set  OT Visit Diagnosis: Muscle weakness (generalized) (M62.81);History of falling (Z91.81)                Time: 6967-8938 OT Time Calculation (min): 23 min Charges:  OT General Charges $OT Visit: 1 Procedure OT Evaluation $OT Eval Low Complexity: 1 Procedure G-Codes:     Lou Cal, OT Pager [phone] 11/23/2016   Raymondo Band 11/23/2016, 11:27 AM

## 2016-11-23 NOTE — Progress Notes (Signed)
Pt refusing bed alarm. Pt educated on risks including fall and injury. Pt still refused.

## 2016-11-23 NOTE — Progress Notes (Signed)
PROGRESS NOTE    Thomas [name]  GNF:[id number] DOB: 27-Mar-1951 DOA: 11/20/2016 PCP: Hoyt Koch, MD   Brief Narrative: 66 year old male with history of chronic atrial fibrillation not on anticoagulation as per prior cardiology note, alcohol liver disease, chronic thrombocytopenia, chronic systolic congestive heart failure, hypertension presented with slurred, generalized weakness and frequent falls. Patient denied chest pain or shortness of breath. In the ER patient was found to have A. fib with RVR and admitted for further evaluation. VQ scan was indeterminate, patient is asymptomatic and no hypoxia.  Assessment & Plan:   Active Problems:   Atrial fibrillation with RVR ([name])   Slurred speech  # Atrial fibrillation with rapid ventricular response: May have been contributed by noncompliance. Chest x-ray unremarkable.  -Patient's heart rate is elevated with low blood pressure systolic around 84-69G. -Heart rate is still elevated. The dose of metoprolol increased by cardiologist. Medication especially metoprolol is limited because of hypotension. He denies headache, dizziness. Continue to monitor closely. Cardiology consult appreciated. -Echocardiogram with EF of 40-45% unchanged from before. Not on anticoagulation because of thrombocytopenia, liver disease, history of noncompliance.   #Chronic systolic congestive heart failure: Last Echo in 2016 with EF of 40-45%. Looks euvolemic on exam. Lasix on hold because of hypotension.  # Slurry speech, recurrent fall and generalized weakness unknown if it is contributed by alcohol abuse: -Echocardiogram unchanged -No orthostatic hypotension -CT scan of head with no acute finding. Patient has generalized cerebral volume loss. -Carotid ultrasound with no critical stenosis -LDL 51 -A1c 5 -PT OT evaluation and supportive care.  #Hypotension: Holding home medication including losartan, Lasix. On metoprolol for rate control. Continue to  monitor blood pressure.  # Acute injury on chronic  kidney disease stage III: Likely in the setting of A. fib with RVR. Serum creatinine level trending down. UA unremarkable. Patient does not have any urinary symptoms. Monitor BMP.  #Hyponatremia likely in the setting of liver disease;  -Continue fluid restriction. Serum sodium level is improving today. Continue to monitor.  #Alcohol abuse/alcohol related liver disease: No sign of withdrawal. Continue vitamins and supportive care.  #Thrombocytopenia likely in the setting of liver disease: No sign of bleeding.  DVT prophylaxis: SCD. DC Lovenox because of thrombocytopenia Code Status: Full code Family Communication: No family at bedside Disposition Plan: Likely discharge home in 1-2 days    Consultants:   None  Procedures: None Antimicrobials: None  Subjective: Seen and examined at bedside. No headache, dizziness, chest pain and shortness of breath..  Objective: Vitals:   11/22/16 1832 11/22/16 2120 11/23/16 0459 11/23/16 0930  BP: (!) 90/56 121/75 117/76 98/63  Pulse: 97 87 (!) 101 85  Resp:  17  18  Temp:  98.8 F (37.1 C) 98.9 F (37.2 C)   TempSrc:  Oral Oral   SpO2: 100% 100% 100%   Weight:   135 kg (297 lb 9.6 oz)   Height:        Intake/Output Summary (Last 24 hours) at 11/23/16 1031 Last data filed at 11/23/16 0815  Gross per 24 hour  Intake             1020 ml  Output              650 ml  Net              370 ml   Filed Weights   11/20/16 2228 11/22/16 0604 11/23/16 0459  Weight: 134.9 kg (297 lb 4.8 oz) 135 kg (297 lb 9.6 oz)  135 kg (297 lb 9.6 oz)    Examination:  General exam: Not in distress Respiratory system: Clear bilaterally, respiratory effort normal. No wheezing or crackle Cardiovascular system: Regular rate rhythm, S1-2 normal. No pedal edema Gastrointestinal system: Abdomen is nondistended, soft and nontender. Normal bowel sounds heard. Central nervous system: Alert and oriented. No  focal neurological deficits. Skin: No rashes, lesions or ulcers Psychiatry: Judgement and insight appear normal. Mood & affect appropriate.     Data Reviewed: I have personally reviewed following labs and imaging studies  CBC:  Recent Labs Lab 11/20/16 1140 11/20/16 1207 11/21/16 0341 11/22/16 0344 11/23/16 0348  WBC 5.7  --  4.7 4.0 4.0  NEUTROABS 4.6  --   --   --   --   HGB 12.8* 13.3 10.8* 10.7* 10.5*  HCT 36.6* 39.0 31.5* 30.7* 31.2*  MCV 107.0*  --  108.6* 107.3* 106.5*  PLT 83*  --  77* 73* 85*   Basic Metabolic Panel:  Recent Labs Lab 11/20/16 1140 11/20/16 1207 11/21/16 0341 11/22/16 0344 11/23/16 0348  NA 127* 126* 128* 125* 126*  K 5.0 4.8 3.7 3.9 3.7  CL 89* 88* 93* 89* 92*  CO2 19*  --  25 24 25   GLUCOSE 132* 125* 98 92 101*  BUN 11 14 12 15 15   CREATININE 2.00* 1.80* 1.63* 1.58* 1.31*  CALCIUM 9.2  --  8.4* 8.5* 8.7*   GFR: Estimated Creatinine Clearance: 82.1 mL/min (A) (by C-G formula based on SCr of 1.31 mg/dL (H)). Liver Function Tests:  Recent Labs Lab 11/20/16 1140  AST 219*  ALT 99*  ALKPHOS 90  BILITOT 5.5*  PROT 6.9  ALBUMIN 3.5   No results for input(s): LIPASE, AMYLASE in the last 168 hours. No results for input(s): AMMONIA in the last 168 hours. Coagulation Profile:  Recent Labs Lab 11/20/16 1140  INR 1.00   Cardiac Enzymes: No results for input(s): CKTOTAL, CKMB, CKMBINDEX, TROPONINI in the last 168 hours. BNP (last 3 results) No results for input(s): PROBNP in the last 8760 hours. HbA1C:  Recent Labs  11/21/16 0352  HGBA1C 5.0   CBG: No results for input(s): GLUCAP in the last 168 hours. Lipid Profile:  Recent Labs  11/22/16 0344  CHOL 131  HDL 64  LDLCALC 51  TRIG 78  CHOLHDL 2.0   Thyroid Function Tests:  Recent Labs  11/21/16 0341  TSH 4.023   Anemia Panel: No results for input(s): VITAMINB12, FOLATE, FERRITIN, TIBC, IRON, RETICCTPCT in the last 72 hours. Sepsis Labs: No results for  input(s): PROCALCITON, LATICACIDVEN in the last 168 hours.  Recent Results (from the past 240 hour(s))  MRSA PCR Screening     Status: None   Collection Time: 11/20/16 10:16 PM  Result Value Ref Range Status   MRSA by PCR NEGATIVE NEGATIVE Final    Comment:        The GeneXpert MRSA Assay (FDA approved for NASAL specimens only), is one component of a comprehensive MRSA colonization surveillance program. It is not intended to diagnose MRSA infection nor to guide or monitor treatment for MRSA infections.          Radiology Studies: Ct Head Wo Contrast  Result Date: 11/21/2016 CLINICAL DATA:  66 year old male with recurrent fall, generalized weakness, slurred speech. EXAM: CT HEAD WITHOUT CONTRAST TECHNIQUE: Contiguous axial images were obtained from the base of the skull through the vertex without intravenous contrast. COMPARISON:  Neck CT 08/21/2013.  Brain MRI 08/20/2004. FINDINGS: Brain: Cerebral volume  loss since 2006 appears generalized. Patchy and confluent white matter hypodensity which appears progressed since the prior MRI. No midline shift, ventriculomegaly, mass effect, evidence of mass lesion, intracranial hemorrhage or evidence of cortically based acute infarction. No cortical encephalomalacia. Vascular: Calcified atherosclerosis at the skull base. No suspicious intracranial vascular hyperdensity. Skull: Intact.  No acute osseous abnormality identified. Sinuses/Orbits: Clear aside from mild maxillary sinus mucosal thickening. Hyperplastic sinuses. Tympanic cavities and mastoids are clear. Other: No scalp hematoma identified. Visualized orbit soft tissues are within normal limits. IMPRESSION: 1.  No acute intracranial abnormality. 2. Generalized cerebral volume loss and progressed nonspecific white matter changes since 2006. Electronically Signed   By: Genevie Ann M.D.   On: 11/21/2016 20:08        Scheduled Meds: . aspirin EC  81 mg Oral BID  . folic acid  1 mg Oral Daily    . methocarbamol  500 mg Oral BID WC  . metoprolol tartrate  25 mg Oral Q8H  . multivitamin with minerals  1 tablet Oral Daily  . polyethylene glycol  17 g Oral Daily  . sodium chloride flush  3 mL Intravenous Q12H  . thiamine  100 mg Oral Daily   Continuous Infusions:   LOS: 3 days    Dron Tanna Furry, MD [facility] Pager [phone]  If 7PM-7AM, please contact night-coverage [email or web address] Password [email or web address] 11/23/2016, 10:31 AM

## 2016-11-24 DIAGNOSIS — I10 Essential (primary) hypertension: Secondary | ICD-10-CM

## 2016-11-24 DIAGNOSIS — I5043 Acute on chronic combined systolic (congestive) and diastolic (congestive) heart failure: Secondary | ICD-10-CM

## 2016-11-24 LAB — BASIC METABOLIC PANEL
ANION GAP: 9 (ref 5–15)
BUN: 14 mg/dL (ref 6–20)
CHLORIDE: 92 mmol/L — AB (ref 101–111)
CO2: 26 mmol/L (ref 22–32)
Calcium: 8.6 mg/dL — ABNORMAL LOW (ref 8.9–10.3)
Creatinine, Ser: 1.14 mg/dL (ref 0.61–1.24)
GFR calc non Af Amer: >60 mL/min (ref >60)
Glucose, Bld: 96 mg/dL (ref 65–99)
POTASSIUM: 3.4 mmol/L — AB (ref 3.5–5.1)
SODIUM: 127 mmol/L — AB (ref 135–145)

## 2016-11-24 MED ORDER — POTASSIUM CHLORIDE CRYS ER 20 MEQ PO TBCR: 40.0000 meq | EXTENDED_RELEASE_TABLET | Freq: Every day | ORAL | 0 refills | Status: DC

## 2016-11-24 MED ORDER — POTASSIUM CHLORIDE CRYS ER 20 MEQ PO TBCR
40.0000 meq | EXTENDED_RELEASE_TABLET | Freq: Once | ORAL | Status: AC
  Administered 2016-11-24: 40 meq via ORAL
  Filled 2016-11-24: qty 2

## 2016-11-24 MED ORDER — POTASSIUM CHLORIDE CRYS ER 20 MEQ PO TBCR: 40.0000 meq | EXTENDED_RELEASE_TABLET | Freq: Two times a day (BID) | ORAL | Status: DC

## 2016-11-24 MED ORDER — METOPROLOL SUCCINATE ER 100 MG PO TB24
100.0000 mg | ORAL_TABLET | Freq: Every day | ORAL | Status: DC
  Administered 2016-11-24: 100 mg via ORAL
  Filled 2016-11-24: qty 1

## 2016-11-24 MED ORDER — FUROSEMIDE 40 MG PO TABS
40.0000 mg | ORAL_TABLET | Freq: Every day | ORAL | Status: DC
Start: 2016-11-24 — End: 2016-11-24
  Administered 2016-11-24: 40 mg via ORAL
  Filled 2016-11-24: qty 1

## 2016-11-24 MED ORDER — METOPROLOL SUCCINATE ER 100 MG PO TB24: 100.0000 mg | ORAL_TABLET | Freq: Every day | ORAL | 0 refills | Status: DC

## 2016-11-24 MED ORDER — FUROSEMIDE 40 MG PO TABS: 40.0000 mg | ORAL_TABLET | Freq: Every day | ORAL | 0 refills | Status: DC

## 2016-11-24 NOTE — Progress Notes (Signed)
Paged MD to inform of morning potassium from 3.7 to 3.4

## 2016-11-24 NOTE — Progress Notes (Signed)
Pt discharged via wheelchair with nurse tech  

## 2016-11-24 NOTE — Progress Notes (Addendum)
PROGRESS NOTE    Thomas [name]  WUJ:[id number] DOB: [birth year] DOA: 11/20/2016 PCP: Hoyt Koch, MD   Brief Narrative: 66 year old male with history of chronic atrial fibrillation not on anticoagulation as per prior cardiology note, alcohol liver disease, chronic thrombocytopenia, chronic systolic congestive heart failure, hypertension presented with slurred, generalized weakness and frequent falls. Patient denied chest pain or shortness of breath. In the ER patient was found to have A. fib with RVR and admitted for further evaluation. VQ scan was indeterminate, patient is asymptomatic and no hypoxia.  Assessment & Plan:   Active Problems:   Atrial fibrillation with RVR ([name])   Slurred speech  # Atrial fibrillation with rapid ventricular response: May have been contributed by noncompliance. Chest x-ray unremarkable.  -Patient is still has elevated heart rate. The cardiologist adjusting medication and he started metoprolol XL today. Continue to monitor in telemetry. Watch for episode of hypotension.  -Echocardiogram with EF of 40-45% unchanged from before. Not on anticoagulation because of thrombocytopenia, liver disease, history of noncompliance.   #Chronic systolic congestive heart failure: Last Echo in 2016 with EF of 40-45%. Looks euvolemic on exam. Lasix on hold because of hypotension.  # Slurry speech, recurrent fall and generalized weakness unknown if it is contributed by alcohol abuse: -Echocardiogram unchanged -No orthostatic hypotension -CT scan of head with no acute finding. Patient has generalized cerebral volume loss. -Carotid ultrasound with no critical stenosis -LDL 51 -A1c 5 -PT OT evaluation and supportive care.  #Hypotension: Blood pressure improved. On metoprolol for rate control. Patient has lower extremity edema and hyponatremia therefore started on Lasix once a day. He takes Lasix twice a day at home. Continue to monitor blood pressure closely today.  Continue to hold other home medications including losartan.  # Acute injury on chronic  kidney disease stage III: Likely in the setting of A. fib with RVR. Serum creatinine level improved. UA unremarkable. Patient does not have any urinary symptoms. Monitor BMP.  #Hyponatremia likely in the setting of liver disease; most likely hypervolemic hyponatremia. -No significant change in serum sodium level, 127 today. Patient takes Lasix twice a day at home but has not been getting here. I'll resume once a day dose of Lasix with close monitoring of blood pressure. He does have lower extremity edema. Replete potassium for the management of hypokalemia. Repeat lab in the morning. Continue fluids restriction.  #Alcohol abuse/alcohol related liver disease: No sign of withdrawal. Continue vitamins and supportive care.  #Thrombocytopenia likely in the setting of liver disease: No sign of bleeding.  DVT prophylaxis: SCD. DC Lovenox because of thrombocytopenia Code Status: Full code Family Communication: No family at bedside Disposition Plan: Likely discharge home in 1-2 days    Consultants:   Cardiology  Procedures: None Antimicrobials: None  Subjective: Seen and examined at bedside. No headache, dizziness, chest pain or shortness of breath. Has intermittent elevated heart rate.  Objective: Vitals:   11/23/16 1659 11/23/16 2023 11/24/16 0135 11/24/16 0554  BP: 107/73 136/74 118/85 102/69  Pulse: 93 (!) 109 88 94  Resp: 17 18  12   Temp:  98.8 F (37.1 C)  98.4 F (36.9 C)  TempSrc:  Oral  Oral  SpO2:  100%  99%  Weight:    135.5 kg (298 lb 11.2 oz)  Height:        Intake/Output Summary (Last 24 hours) at 11/24/16 1057 Last data filed at 11/24/16 0909  Gross per 24 hour  Intake  1080 ml  Output              350 ml  Net              730 ml   Filed Weights   11/22/16 0604 11/23/16 0459 11/24/16 0554  Weight: 135 kg (297 lb 9.6 oz) 135 kg (297 lb 9.6 oz) 135.5 kg (298 lb  11.2 oz)    Examination:  General exam: Not in distress Respiratory system: Clear bilateral, respiratory effort normal. No wheezing or crackle Cardiovascular system: Irregularly irregular, tachycardic, S1 is normal Gastrointestinal system: Abdomen soft, nontender, nondistended. Bowel sound positive Central nervous system: Alert and oriented. No focal neurological deficits. Skin: No rashes, lesions or ulcers Psychiatry: Judgement and insight appear normal. Mood & affect appropriate.     Data Reviewed: I have personally reviewed following labs and imaging studies  CBC:  Recent Labs Lab 11/20/16 1140 11/20/16 1207 11/21/16 0341 11/22/16 0344 11/23/16 0348  WBC 5.7  --  4.7 4.0 4.0  NEUTROABS 4.6  --   --   --   --   HGB 12.8* 13.3 10.8* 10.7* 10.5*  HCT 36.6* 39.0 31.5* 30.7* 31.2*  MCV 107.0*  --  108.6* 107.3* 106.5*  PLT 83*  --  77* 73* 85*   Basic Metabolic Panel:  Recent Labs Lab 11/20/16 1140 11/20/16 1207 11/21/16 0341 11/22/16 0344 11/23/16 0348 11/24/16 0154  NA 127* 126* 128* 125* 126* 127*  K 5.0 4.8 3.7 3.9 3.7 3.4*  CL 89* 88* 93* 89* 92* 92*  CO2 19*  --  25 24 25 26   GLUCOSE 132* 125* 98 92 101* 96  BUN 11 14 12 15 15 14   CREATININE 2.00* 1.80* 1.63* 1.58* 1.31* 1.14  CALCIUM 9.2  --  8.4* 8.5* 8.7* 8.6*   GFR: Estimated Creatinine Clearance: 94.6 mL/min (by C-G formula based on SCr of 1.14 mg/dL). Liver Function Tests:  Recent Labs Lab 11/20/16 1140  AST 219*  ALT 99*  ALKPHOS 90  BILITOT 5.5*  PROT 6.9  ALBUMIN 3.5   No results for input(s): LIPASE, AMYLASE in the last 168 hours. No results for input(s): AMMONIA in the last 168 hours. Coagulation Profile:  Recent Labs Lab 11/20/16 1140  INR 1.00   Cardiac Enzymes: No results for input(s): CKTOTAL, CKMB, CKMBINDEX, TROPONINI in the last 168 hours. BNP (last 3 results) No results for input(s): PROBNP in the last 8760 hours. HbA1C: No results for input(s): HGBA1C in the last  72 hours. CBG: No results for input(s): GLUCAP in the last 168 hours. Lipid Profile:  Recent Labs  11/22/16 0344  CHOL 131  HDL 64  LDLCALC 51  TRIG 78  CHOLHDL 2.0   Thyroid Function Tests: No results for input(s): TSH, T4TOTAL, FREET4, T3FREE, THYROIDAB in the last 72 hours. Anemia Panel: No results for input(s): VITAMINB12, FOLATE, FERRITIN, TIBC, IRON, RETICCTPCT in the last 72 hours. Sepsis Labs: No results for input(s): PROCALCITON, LATICACIDVEN in the last 168 hours.  Recent Results (from the past 240 hour(s))  MRSA PCR Screening     Status: None   Collection Time: 11/20/16 10:16 PM  Result Value Ref Range Status   MRSA by PCR NEGATIVE NEGATIVE Final    Comment:        The GeneXpert MRSA Assay (FDA approved for NASAL specimens only), is one component of a comprehensive MRSA colonization surveillance program. It is not intended to diagnose MRSA infection nor to guide or monitor treatment for MRSA infections.  Radiology Studies: No results found.      Scheduled Meds: . aspirin EC  81 mg Oral BID  . folic acid  1 mg Oral Daily  . furosemide  40 mg Oral Daily  . methocarbamol  500 mg Oral BID WC  . metoprolol succinate  100 mg Oral Daily  . multivitamin with minerals  1 tablet Oral Daily  . polyethylene glycol  17 g Oral Daily  . potassium chloride  40 mEq Oral BID  . sodium chloride flush  3 mL Intravenous Q12H  . thiamine  100 mg Oral Daily   Continuous Infusions:   LOS: 4 days    Dron Tanna Furry, MD [facility] Pager [phone]  If 7PM-7AM, please contact night-coverage [email or web address] Password [email or web address] 11/24/2016, 10:57 AM

## 2016-11-24 NOTE — Progress Notes (Addendum)
Pt discharged education provided at bedside. Pt removed IV himself and telemetry. Site clean dry and intact. Pt has all belongings including printed prescription. Awaiting transportation for discharge.

## 2016-11-24 NOTE — Care Management Note (Signed)
Case Management Note  Patient Details  Name: Peggy Monk MRN: [id number] Date of Birth: 08-29-1950  Subjective/Objective:                 Spoke w patient at the bedside. He states he lives at home w his mom. He has DME RW, denies need for additional DME. Spoke about HH needs. He states that he does not want HH at this time. He states he will "take it slow" and plans on joining the [facility] shortly. Discussed how HH could assist with building up his strength in the short term prior to the [facility], however he still declined. Patient states he drives, and he denied any difficulty getting/ affording his medications.    Action/Plan:  Anticipate DC to home self care at DC.  Expected Discharge Date:  11/21/16               Expected Discharge Plan:  Home/Self Care  In-House Referral:     Discharge planning Services  CM Consult  Post Acute Care Choice:    Choice offered to:  Patient  DME Arranged:    DME Agency:     HH Arranged:  Patient Refused [place] Agency:     Status of Service:  Completed, signed off  If discussed at [employer] of Stay Meetings, dates discussed:    Additional Comments:  Carles Collet, RN 11/24/2016, 11:13 AM

## 2016-11-24 NOTE — Progress Notes (Signed)
Pt refused bed alarm. Pt aware of risks including falls and injury, pt still refused Will continue to monitor

## 2016-11-24 NOTE — Discharge Summary (Addendum)
Physician Discharge Summary  Masoud Nyce DTO:[id number] DOB: 10-12-1950 DOA: 11/20/2016  PCP: Hoyt Koch, MD  Admit date: 11/20/2016 Discharge date: 11/24/2016  Admitted From:home Disposition:home  Recommendations for Outpatient Follow-up:  1. Follow up with PCP in 1-2 weeks 2. Please obtain BMP/CBC in one week   Home Health:yes Equipment/Devices:none Discharge Condition:stable CODE STATUS:full code Diet recommendation:heart healthy  Brief/Interim Summary: 66 year old male with history of chronic atrial fibrillation not on anticoagulation as per prior cardiology note, alcohol liver disease, chronic thrombocytopenia, chronic systolic congestive heart failure, hypertension presented with slurred, generalized weakness and frequent falls. Patient denied chest pain or shortness of breath. In the ER patient was found to have A. fib with RVR and admitted for further evaluation. VQ scan was indeterminate, patient is asymptomatic and no hypoxia.  # Atrial fibrillation with rapid ventricular response: May have been contributed by noncompliance. Chest x-ray unremarkable.  - The cardiologist adjusted medication and started metoprolol XL -Echocardiogram with EF of 40-45% unchanged from before. Not on anticoagulation because of thrombocytopenia, liver disease, history of noncompliance.  -Education provided to the patient.  #Chronic systolic congestive heart failure: Last Echo in 2016 with EF of 40-45%.   # Slurry speech, recurrent fall and generalized weakness unknown if it is contributed by alcohol abuse: -Echocardiogram unchanged -No orthostatic hypotension -CT scan of head with no acute finding. Patient has generalized cerebral volume loss. -Carotid ultrasound with no critical stenosis -LDL 51 -A1c 5 -PT OT evaluation and supportive care. Discharging with the therapies at home.  #Hypotension: Blood pressure improved. On metoprolol for rate control. Patient has lower extremity  edema and hyponatremia therefore started on Lasix once a day. He takes Lasix twice a day at home. Continue to monitor blood pressure closely today.  discontinue losartan and Coreg on discharge.  # Acute injury on chronic  kidney disease stage III: Likely in the setting of A. fib with RVR. Serum creatinine level improved. UA unremarkable. Patient does not have any urinary symptoms. Monitor BMP.  #Hyponatremia likely in the setting of liver disease; most likely hypervolemic hyponatremia. -Sodium level is stable at 127. Resume Lasix today. Blood pressure better. Recommended to monitor lab in a week with PCP. Patient verbalized understanding.  #Alcohol abuse/alcohol related liver disease: No sign of withdrawal. Continue vitamins and supportive care.  #Thrombocytopenia likely in the setting of liver disease: No sign of bleeding.  Initially I was planning to monitor his heart rate and repeat lab in the morning before discharge. I received a call from the nurse saying that patient wants to go home today otherwise he will leave the hospital [place]. I thought patient would benefit from education regarding his medication and discharge planning. I discussed with the patient at bedside. He verbalized understanding about his medications, importance of monitoring his heart rate and blood pressure and closely follow-up with PCP to monitor labs. At this time since patient verbalized the follow-up plan and medications, he will be discharged home. PT/OT ordered.   Morbid obesity: education in healthy diet and exercise  Discharge Diagnoses:  Active Problems:   Atrial fibrillation with RVR ([place])   Slurred speech    Discharge Instructions  Discharge Instructions    Call MD for:  difficulty breathing, headache or visual disturbances    Complete by:  As directed    Call MD for:  extreme fatigue    Complete by:  As directed    Call MD for:  hives    Complete by:  As directed  Call MD  for:  persistant dizziness or light-headedness    Complete by:  As directed    Call MD for:  persistant nausea and vomiting    Complete by:  As directed    Call MD for:  severe uncontrolled pain    Complete by:  As directed    Call MD for:  temperature >100.4    Complete by:  As directed    Diet - low sodium heart healthy    Complete by:  As directed    Discharge instructions    Complete by:  As directed    Please check lab, BMP with your PCP in one week. Check BP and HR at home twice a day   Increase activity slowly    Complete by:  As directed      Allergies as of 11/24/2016   No Known Allergies     Medication List    STOP taking these medications   carvedilol 12.5 MG tablet Commonly known as:  COREG   losartan 25 MG tablet Commonly known as:  COZAAR     TAKE these medications   aspirin EC 325 MG tablet Take 1 tablet (325 mg total) by mouth 2 (two) times daily.   folic acid 1 MG tablet Commonly known as:  FOLVITE Take 1 tablet (1 mg total) by mouth daily.   furosemide 40 MG tablet Commonly known as:  LASIX Take 1 tablet (40 mg total) by mouth daily. What changed:  when to take this   lidocaine 5 % Commonly known as:  LIDODERM Place 1 patch onto the skin 2 (two) times daily as needed. Remove & Discard patch within 12 hours or as directed by MD   methocarbamol 500 MG tablet Commonly known as:  ROBAXIN Take 1 tablet (500 mg total) by mouth 2 (two) times daily with a meal.   metoprolol succinate 100 MG 24 hr tablet Commonly known as:  TOPROL-XL Take 1 tablet (100 mg total) by mouth daily. Take with or immediately following a meal.   multivitamin with minerals Tabs tablet Take 1 tablet by mouth daily.   potassium chloride SA 20 MEQ tablet Commonly known as:  K-DUR,KLOR-CON Take 2 tablets (40 mEq total) by mouth daily.   thiamine 100 MG tablet Take 1 tablet (100 mg total) by mouth daily.      Follow-up Information    Sueanne Margarita, MD Follow up on  12/15/2016.   Specialty:  Cardiology Why:  at 11:20 AM Contact information: [address] [place] [address] [phone]        Hoyt Koch, MD. Schedule an appointment as soon as possible for a visit in 1 week(s).   Specialty:  Internal Medicine Contact information: [place] [address] [phone]          No Known Allergies  Consultations: Cardiology  Procedures/Studies: None  Subjective: Seen and examined at bedside. Denied headache, dizziness, nausea vomiting chest pain shortness of breath. Wanted to go home.  Discharge Exam: Vitals:   11/24/16 0554 11/24/16 1132  BP: 102/69 112/70  Pulse: 94 88  Resp: 12 18  Temp: 98.4 F (36.9 C)    Vitals:   11/23/16 2023 11/24/16 0135 11/24/16 0554 11/24/16 1132  BP: 136/74 118/85 102/69 112/70  Pulse: (!) 109 88 94 88  Resp: 18  12 18   Temp: 98.8 F (37.1 C)  98.4 F (36.9 C)   TempSrc: Oral  Oral   SpO2:  100%  99% 97%  Weight:   135.5 kg (298 lb 11.2 oz)   Height:        General: Pt is alert, awake, not in acute distress Cardiovascular: RRR, S1/S2 +, no rubs, no gallops Respiratory: CTA bilaterally, no wheezing, no rhonchi Abdominal: Soft, NT, ND, bowel sounds + Extremities: no edema, no cyanosis    The results of significant diagnostics from this hospitalization (including imaging, microbiology, ancillary and laboratory) are listed below for reference.     Microbiology: Recent Results (from the past 240 hour(s))  MRSA PCR Screening     Status: None   Collection Time: 11/20/16 10:16 PM  Result Value Ref Range Status   MRSA by PCR NEGATIVE NEGATIVE Final    Comment:        The GeneXpert MRSA Assay (FDA approved for NASAL specimens only), is one component of a comprehensive MRSA colonization surveillance program. It is not intended to diagnose MRSA infection nor to guide or monitor treatment for MRSA infections.      Labs: BNP (last 3  results) No results for input(s): BNP in the last 8760 hours. Basic Metabolic Panel:  Recent Labs Lab 11/20/16 1140 11/20/16 1207 11/21/16 0341 11/22/16 0344 11/23/16 0348 11/24/16 0154  NA 127* 126* 128* 125* 126* 127*  K 5.0 4.8 3.7 3.9 3.7 3.4*  CL 89* 88* 93* 89* 92* 92*  CO2 19*  --  25 24 25 26   GLUCOSE 132* 125* 98 92 101* 96  BUN 11 14 12 15 15 14   CREATININE 2.00* 1.80* 1.63* 1.58* 1.31* 1.14  CALCIUM 9.2  --  8.4* 8.5* 8.7* 8.6*   Liver Function Tests:  Recent Labs Lab 11/20/16 1140  AST 219*  ALT 99*  ALKPHOS 90  BILITOT 5.5*  PROT 6.9  ALBUMIN 3.5   No results for input(s): LIPASE, AMYLASE in the last 168 hours. No results for input(s): AMMONIA in the last 168 hours. CBC:  Recent Labs Lab 11/20/16 1140 11/20/16 1207 11/21/16 0341 11/22/16 0344 11/23/16 0348  WBC 5.7  --  4.7 4.0 4.0  NEUTROABS 4.6  --   --   --   --   HGB 12.8* 13.3 10.8* 10.7* 10.5*  HCT 36.6* 39.0 31.5* 30.7* 31.2*  MCV 107.0*  --  108.6* 107.3* 106.5*  PLT 83*  --  77* 73* 85*   Cardiac Enzymes: No results for input(s): CKTOTAL, CKMB, CKMBINDEX, TROPONINI in the last 168 hours. BNP: Invalid input(s): POCBNP CBG: No results for input(s): GLUCAP in the last 168 hours. D-Dimer No results for input(s): DDIMER in the last 72 hours. Hgb A1c No results for input(s): HGBA1C in the last 72 hours. Lipid Profile  Recent Labs  11/22/16 0344  CHOL 131  HDL 64  LDLCALC 51  TRIG 78  CHOLHDL 2.0   Thyroid function studies No results for input(s): TSH, T4TOTAL, T3FREE, THYROIDAB in the last 72 hours.  Invalid input(s): FREET3 Anemia work up No results for input(s): VITAMINB12, FOLATE, FERRITIN, TIBC, IRON, RETICCTPCT in the last 72 hours. Urinalysis    Component Value Date/Time   COLORURINE ORANGE (A) 10/23/2015 1100   APPEARANCEUR CLOUDY (A) 10/23/2015 1100   LABSPEC 1.028 10/23/2015 1100   PHURINE 6.0 10/23/2015 1100   GLUCOSEU NEGATIVE 10/23/2015 1100   HGBUR  NEGATIVE 10/23/2015 1100   BILIRUBINUR MODERATE (A) 10/23/2015 1100   KETONESUR >80 (A) 10/23/2015 1100   PROTEINUR 100 (A) 10/23/2015 1100   NITRITE NEGATIVE 10/23/2015 1100   LEUKOCYTESUR TRACE (A)  10/23/2015 1100   Sepsis Labs Invalid input(s): PROCALCITONIN,  WBC,  LACTICIDVEN Microbiology Recent Results (from the past 240 hour(s))  MRSA PCR Screening     Status: None   Collection Time: 11/20/16 10:16 PM  Result Value Ref Range Status   MRSA by PCR NEGATIVE NEGATIVE Final    Comment:        The GeneXpert MRSA Assay (FDA approved for NASAL specimens only), is one component of a comprehensive MRSA colonization surveillance program. It is not intended to diagnose MRSA infection nor to guide or monitor treatment for MRSA infections.      Time coordinating discharge: 31 minutes  SIGNED:   Rosita Fire, MD  [facility] 11/24/2016, 3:19 PM  If 7PM-7AM, please contact night-coverage [email or web address] Password [email or web address]

## 2016-11-24 NOTE — Progress Notes (Signed)
Progress Note  Patient Name: Thomas [name] Date of Encounter: 11/24/2016  Primary Cardiologist: Dr. Radford Pax  Subjective   Feeling well. Wants to go home.  No chest pain or shortness of breath. Denies palpitations.   Inpatient Medications    Scheduled Meds: . aspirin EC  81 mg Oral BID  . folic acid  1 mg Oral Daily  . methocarbamol  500 mg Oral BID WC  . metoprolol tartrate  25 mg Oral Q8H  . multivitamin with minerals  1 tablet Oral Daily  . polyethylene glycol  17 g Oral Daily  . sodium chloride flush  3 mL Intravenous Q12H  . thiamine  100 mg Oral Daily   Continuous Infusions:  PRN Meds: acetaminophen **OR** acetaminophen, HYDROcodone-acetaminophen, lidocaine, metoprolol tartrate, ondansetron **OR** ondansetron (ZOFRAN) IV, zolpidem   Vital Signs    Vitals:   11/23/16 1659 11/23/16 2023 11/24/16 0135 11/24/16 0554  BP: 107/73 136/74 118/85 102/69  Pulse: 93 (!) 109 88 94  Resp: 17 18  12   Temp:  98.8 F (37.1 C)  98.4 F (36.9 C)  TempSrc:  Oral  Oral  SpO2:  100%  99%  Weight:    135.5 kg (298 lb 11.2 oz)  Height:        Intake/Output Summary (Last 24 hours) at 11/24/16 1019 Last data filed at 11/24/16 0909  Gross per 24 hour  Intake             1080 ml  Output              350 ml  Net              730 ml   Filed Weights   11/22/16 0604 11/23/16 0459 11/24/16 0554  Weight: 135 kg (297 lb 9.6 oz) 135 kg (297 lb 9.6 oz) 135.5 kg (298 lb 11.2 oz)    Telemetry    Atrial fibrillation.  Rates mosly <100 bpm.  Occasionally 120s. No events. - Personally Reviewed  ECG    N/a - Personally Reviewed  Physical Exam   VS:  BP 102/69 (BP Location: Left Arm)   Pulse 94   Temp 98.4 F (36.9 C) (Oral)   Resp 12   Ht 6' (1.829 m)   Wt 135.5 kg (298 lb 11.2 oz)   SpO2 99%   BMI 40.51 kg/m  , BMI Body mass index is 40.51 kg/m. GENERAL:  Well appearing.  Laying flat in bed in no acute distress. HEENT: Pupils equal round and reactive, fundi not  visualized, oral mucosa unremarkable NECK:  No jugular venous distention, waveform within normal limits, carotid upstroke brisk and symmetric, no bruits LUNGS:  Clear to auscultation bilaterally.  No crackles, wheezes or rhonchi HEART:  Irregularly irregular.   PMI not displaced or sustained,S1 and S2 within normal limits, no S3, no S4, no clicks, no rubs, no murmurs ABD:  Flat, positive bowel sounds normal in frequency in pitch, no bruits, no rebound, no guarding, no midline pulsatile mass, no hepatomegaly, no splenomegaly EXT:  2 plus pulses throughout, 1+ pitting edema, no cyanosis no clubbing SKIN:  No rashes no nodules NEURO:  Cranial nerves II through XII grossly intact, motor grossly intact throughout PSYCH:  Cognitively intact, oriented to person place and time   Labs    Chemistry Recent Labs Lab 11/20/16 1140  11/22/16 0344 11/23/16 0348 11/24/16 0154  NA 127*  < > 125* 126* 127*  K 5.0  < > 3.9 3.7 3.4*  CL  89*  < > 89* 92* 92*  CO2 19*  < > 24 25 26   GLUCOSE 132*  < > 92 101* 96  BUN 11  < > 15 15 14   CREATININE 2.00*  < > 1.58* 1.31* 1.14  CALCIUM 9.2  < > 8.5* 8.7* 8.6*  PROT 6.9  --   --   --   --   ALBUMIN 3.5  --   --   --   --   AST 219*  --   --   --   --   ALT 99*  --   --   --   --   ALKPHOS 90  --   --   --   --   BILITOT 5.5*  --   --   --   --   GFRNONAA 34*  < > 45* 56* >60  GFRAA 39*  < > 52* >60 >60  ANIONGAP 19*  < > 12 9 9   < > = values in this interval not displayed.   Hematology  Recent Labs Lab 11/21/16 0341 11/22/16 0344 11/23/16 0348  WBC 4.7 4.0 4.0  RBC 2.90* 2.86* 2.93*  HGB 10.8* 10.7* 10.5*  HCT 31.5* 30.7* 31.2*  MCV 108.6* 107.3* 106.5*  MCH 37.2* 37.4* 35.8*  MCHC 34.3 34.9 33.7  RDW 14.3 13.9 13.9  PLT 77* 73* 85*    Cardiac EnzymesNo results for input(s): TROPONINI in the last 168 hours.   Recent Labs Lab 11/20/16 1205 11/20/16 1548  TROPIPOC 0.02 0.02     BNPNo results for input(s): BNP, PROBNP in the last  168 hours.   DDimer   Recent Labs Lab 11/20/16 1543  DDIMER 1.28*     Radiology    No results found.  Cardiac Studies   Echo 11/21/16: Study Conclusions  - Left ventricle: The cavity size was normal. Systolic function was   mildly to moderately reduced. The estimated ejection fraction was   in the range of 40% to 45%. Diffuse hypokinesis. - Aortic valve: Transvalvular velocity was within the normal range.   There was no stenosis. There was no regurgitation. - Mitral valve: Transvalvular velocity was within the normal range.   There was no evidence for stenosis. There was mild regurgitation. - Left atrium: The atrium was severely dilated. - Right ventricle: The cavity size was normal. Wall thickness was   normal. Systolic function was mildly reduced. - Tricuspid valve: There was mild regurgitation. - Pulmonary arteries: Systolic pressure was within the normal   range. PA peak pressure: 31 mm Hg (S).  Carotid Doppler 11/22/16: 1-39% ICA stenosis bilaterally.  Patient Profile     [name] is a 73M with EtOH abuse, chronic systolic and diastolic heart failure, dilated cardiomyopathy secondary alcohol abuse, hypertension, pancytopenia secondary to alcoholism, and paroxysmal atrial fibrillation admitted with atrial fibrillation with rapid ventricular response.  Assessment & Plan   # Persistent atrial fibrillation: Heart rates are better-controlled.  He continues to have some short runs of rate >100 bpm.  We will switch metoprolol from 25mg  q8h to metoprolol succinate 100 mg po daily.  BP is stable but low.  Mr. Putt is not a candidate for anticoagulation 2/2 noncompliance, alcohlol abuse and thrombocytopenia.   # Chronic systolic and diastolic heart failure:  Euvolemic.  Home meds other than beta blocker are on hold.   # Hypertension: # Hypotension: Metoprolol as above.   # Carotid stenosis: Carotid Dopplers this admission revealed mild ICA stenosis bilaterally.  Echo was unchanged from prior.   # Acute on chronic kidney disease: Renal function continues to improve.  We will arrange outpatient follow up within 2 weeks.  OK to discharge from a cardiology perspective.   Signed, Skeet Latch, MD  11/24/2016, 10:19 AM

## 2016-11-24 NOTE — Progress Notes (Signed)
Paged MD regarding pt wanting to leave. Per notes states cardiology signed off. Awaiting call back.

## 2016-11-24 NOTE — Progress Notes (Signed)
MD returned page and stated he will come to bed side to talk to pt

## 2016-11-24 NOTE — Progress Notes (Signed)
Physical Therapy Treatment Patient Details Name: Thomas [name] MRN: [id number] DOB: April 03, 1951 Today's Date: 11/24/2016    History of Present Illness  32M with EtOH abuse, chronic systolic and diastolic heart failure, dilated cardiomyopathy secondary alcohol abuse, hypertension, pancytopenia secondary to alcoholism, and paroxysmal atrial fibrillation admitted with atrial fibrillation with rapid ventricular response. This occurred in the setting of noncompliance and not taking any medications over the period of one week. His heart rate and blood pressure remained poorly-controlled on carvedilol    PT Comments    Pt is expecting to get home today, very motivated to walk and do strengthening exercises.  His plan is to continue acute therapy if dc is delayed, and will still be appropriate for home therapy due to his LE strength which is definitely decreased for hips and knees B.     Follow Up Recommendations  Home health PT     Equipment Recommendations  None recommended by PT    Recommendations for Other Services OT consult     Precautions / Restrictions Precautions Precautions: Fall Precaution Comments: falls recently Restrictions Weight Bearing Restrictions: No    Mobility  Bed Mobility               General bed mobility comments: sitting side of bed when PT arrived  Transfers Overall transfer level: Needs assistance Equipment used: Rolling walker (2 wheeled) Transfers: Sit to/from Stand Sit to Stand: Min guard         General transfer comment: reminders for safety in transition, to slow down  Ambulation/Gait Ambulation/Gait assistance: Min guard Ambulation Distance (Feet): 150 Feet Assistive device: Rolling walker (2 wheeled) Gait Pattern/deviations: Step-through pattern;Wide base of support;Trunk flexed;Decreased stride length Gait velocity: reduced Gait velocity interpretation: Below normal speed for age/gender General Gait Details: has been able to  [employer] with doorways and obstacles   Stairs            Wheelchair Mobility    Modified Rankin (Stroke Patients Only)       Balance Overall balance assessment: History of Falls;Needs assistance Sitting-balance support: Feet supported Sitting balance-Leahy Scale: Good     Standing balance support: Bilateral upper extremity supported Standing balance-Leahy Scale: Fair                              Cognition Arousal/Alertness: Awake/alert Behavior During Therapy: WFL for tasks assessed/performed Overall Cognitive Status: History of cognitive impairments - at baseline                                 General Comments: decr safety awareness       Exercises General Exercises - Lower Extremity Long Arc Quad: Strengthening;Both;10 reps Heel Slides: Strengthening;Both;10 reps Hip ABduction/ADduction: Strengthening;Both;10 reps    General Comments        Pertinent Vitals/Pain Pain Assessment: No/denies pain    Home Living                      Prior Function            PT Goals (current goals can now be found in the care plan section) Progress towards PT goals: Progressing toward goals    Frequency    Min 3X/week      PT Plan Current plan remains appropriate    Co-evaluation  AM-PAC PT "6 Clicks" Daily Activity  Outcome Measure  Difficulty turning over in bed (including adjusting bedclothes, sheets and blankets)?: A Little Difficulty moving from lying on back to sitting on the side of the bed? : A Little Difficulty sitting down on and standing up from a chair with arms (e.g., wheelchair, bedside commode, etc,.)?: A Little Help needed moving to and from a bed to chair (including a wheelchair)?: A Little Help needed walking in hospital room?: A Little Help needed climbing 3-5 steps with a railing? : A Little 6 Click Score: 18    End of Session Equipment Utilized During Treatment: Gait  belt Activity Tolerance: Patient tolerated treatment well Patient left: in bed;with call bell/phone within reach (sitting side of bed) Nurse Communication: Mobility status PT Visit Diagnosis: Other abnormalities of gait and mobility (R26.89);Repeated falls (R29.6);Muscle weakness (generalized) (M62.81);Dizziness and giddiness (R42)     Time: 1215-1229 PT Time Calculation (min) (ACUTE ONLY): 14 min  Charges:  $Gait Training: 8-22 mins                    G Codes:  Functional Assessment Tool Used: AM-PAC 6 Clicks Basic Mobility     Ramond Dial 11/24/2016, 1:59 PM   Mee Hives, PT MS Acute Rehab Dept. Number: [facility]

## 2016-12-11 ENCOUNTER — Ambulatory Visit (INDEPENDENT_AMBULATORY_CARE_PROVIDER_SITE_OTHER): Payer: 59 | Admitting: Nurse Practitioner

## 2016-12-11 ENCOUNTER — Encounter: Payer: Self-pay | Admitting: Nurse Practitioner

## 2016-12-11 ENCOUNTER — Other Ambulatory Visit (INDEPENDENT_AMBULATORY_CARE_PROVIDER_SITE_OTHER): Payer: 59

## 2016-12-11 VITALS — BP 124/84 | HR 54 | Temp 98.4°F | Ht 72.0 in | Wt 310.0 lb

## 2016-12-11 DIAGNOSIS — I4891 Unspecified atrial fibrillation: Secondary | ICD-10-CM

## 2016-12-11 DIAGNOSIS — K709 Alcoholic liver disease, unspecified: Secondary | ICD-10-CM | POA: Diagnosis not present

## 2016-12-11 DIAGNOSIS — I5023 Acute on chronic systolic (congestive) heart failure: Secondary | ICD-10-CM

## 2016-12-11 DIAGNOSIS — K5900 Constipation, unspecified: Secondary | ICD-10-CM

## 2016-12-11 DIAGNOSIS — D61818 Other pancytopenia: Secondary | ICD-10-CM | POA: Diagnosis not present

## 2016-12-11 DIAGNOSIS — F1010 Alcohol abuse, uncomplicated: Secondary | ICD-10-CM | POA: Diagnosis not present

## 2016-12-11 LAB — BASIC METABOLIC PANEL
BUN: 11 mg/dL (ref 6–23)
CALCIUM: 9.4 mg/dL (ref 8.4–10.5)
CO2: 27 meq/L (ref 19–32)
CREATININE: 0.97 mg/dL (ref 0.40–1.50)
Chloride: 99 mEq/L (ref 96–112)
GFR: 100.27 mL/min (ref >60.00)
GLUCOSE: 98 mg/dL (ref 70–99)
Potassium: 4.5 mEq/L (ref 3.5–5.1)
Sodium: 134 mEq/L — ABNORMAL LOW (ref 135–145)

## 2016-12-11 LAB — CBC
HEMATOCRIT: 39 % (ref 39.0–52.0)
HEMOGLOBIN: 13.2 g/dL (ref 13.0–17.0)
MCHC: 33.8 g/dL (ref 30.0–36.0)
MCV: 112.8 fl — ABNORMAL HIGH (ref 78.0–100.0)
Platelets: 236 10*3/uL (ref 150.0–400.0)
RBC: 3.45 Mil/uL — AB (ref 4.22–5.81)
RDW: 14.5 % (ref 11.5–15.5)
WBC: 5.2 10*3/uL (ref 4.0–10.5)

## 2016-12-11 MED ORDER — FOLIC ACID 1 MG PO TABS: 1.0000 mg | ORAL_TABLET | Freq: Every day | ORAL | 0 refills | Status: AC

## 2016-12-11 MED ORDER — METOPROLOL SUCCINATE ER 50 MG PO TB24: 50.0000 mg | ORAL_TABLET | Freq: Every day | ORAL | 3 refills | Status: DC

## 2016-12-11 MED ORDER — POTASSIUM CHLORIDE CRYS ER 20 MEQ PO TBCR: 40.0000 meq | EXTENDED_RELEASE_TABLET | Freq: Every day | ORAL | 0 refills | Status: DC

## 2016-12-11 MED ORDER — SPIRONOLACTONE 50 MG PO TABS: 50.0000 mg | ORAL_TABLET | Freq: Every day | ORAL | 2 refills | Status: DC

## 2016-12-11 MED ORDER — VITAMIN B-12 500 MCG PO TABS: 500.0000 ug | ORAL_TABLET | Freq: Every day | ORAL | 1 refills | Status: AC

## 2016-12-11 MED ORDER — FUROSEMIDE 40 MG PO TABS: 40.0000 mg | ORAL_TABLET | Freq: Every day | ORAL | 0 refills | Status: DC

## 2016-12-11 MED ORDER — THIAMINE HCL 100 MG PO TABS: 100.0000 mg | ORAL_TABLET | Freq: Every day | ORAL | 0 refills | Status: AC

## 2016-12-11 MED ORDER — SENNA-DOCUSATE SODIUM 8.6-50 MG PO TABS: 1.0000 | ORAL_TABLET | Freq: Every day | ORAL | 0 refills | Status: AC

## 2016-12-11 MED ORDER — LACTULOSE 10 GM/15ML PO SOLN: 20.0000 g | Freq: Every day | ORAL | 0 refills | Status: DC | PRN

## 2016-12-11 NOTE — Patient Instructions (Addendum)
Check weight daily. Call office if notices weight gain of 3Lbs in 1week.  Improved electrolytes and kidney function. Stable cbc with persistent large RBCs. This is an indication of vitamin B12, thiamine and folic acid deficiency. Continue supplement at prescribed. F/up with Dr. Sharlet Salina in 50month a discussed. Check weight daily. Return to office if weight gain >3Lbs in 1week.  Low-Sodium Eating Plan Sodium, which is an element that makes up salt, helps you maintain a healthy balance of fluids in your body. Too much sodium can increase your blood pressure and cause fluid and waste to be held in your body. Your health care provider or dietitian may recommend following this plan if you have high blood pressure (hypertension), kidney disease, liver disease, or heart failure. Eating less sodium can help lower your blood pressure, reduce swelling, and protect your heart, liver, and kidneys. What are tips for following this plan? General guidelines  Most people on this plan should limit their sodium intake to 1,500-2,000 mg (milligrams) of sodium each day. Reading food labels  The Nutrition Facts label lists the amount of sodium in one serving of the food. If you eat more than one serving, you must multiply the listed amount of sodium by the number of servings.  Choose foods with less than 140 mg of sodium per serving.  Avoid foods with 300 mg of sodium or more per serving. Shopping  Look for lower-sodium products, often labeled as "low-sodium" or "no salt added."  Always check the sodium content even if foods are labeled as "unsalted" or "no salt added".  Buy fresh foods. ? Avoid canned foods and premade or frozen meals. ? Avoid canned, cured, or processed meats  Buy breads that have less than 80 mg of sodium per slice. Cooking  Eat more home-cooked food and less restaurant, buffet, and fast food.  Avoid adding salt when cooking. Use salt-free seasonings or herbs instead of table  salt or sea salt. Check with your health care provider or pharmacist before using salt substitutes.  Cook with plant-based oils, such as canola, sunflower, or olive oil. Meal planning  When eating at a restaurant, ask that your food be prepared with less salt or no salt, if possible.  Avoid foods that contain MSG (monosodium glutamate). MSG is sometimes added to [place] food, bouillon, and some canned foods. What foods are recommended? The items listed may not be a complete list. Talk with your dietitian about what dietary choices are best for you. Grains Low-sodium cereals, including oats, puffed wheat and rice, and shredded wheat. Low-sodium crackers. Unsalted rice. Unsalted pasta. Low-sodium bread. Whole-grain breads and whole-grain pasta. Vegetables Fresh or frozen vegetables. "No salt added" canned vegetables. "No salt added" tomato sauce and paste. Low-sodium or reduced-sodium tomato and vegetable juice. Fruits Fresh, frozen, or canned fruit. Fruit juice. Meats and other protein foods Fresh or frozen (no salt added) meat, poultry, seafood, and fish. Low-sodium canned tuna and salmon. Unsalted nuts. Dried peas, beans, and lentils without added salt. Unsalted canned beans. Eggs. Unsalted nut butters. Dairy Milk. Soy milk. Cheese that is naturally low in sodium, such as ricotta cheese, fresh mozzarella, or Swiss cheese Low-sodium or reduced-sodium cheese. Cream cheese. Yogurt. Fats and oils Unsalted butter. Unsalted margarine with no trans fat. Vegetable oils such as canola or olive oils. Seasonings and other foods Fresh and dried herbs and spices. Salt-free seasonings. Low-sodium mustard and ketchup. Sodium-free salad dressing. Sodium-free light mayonnaise. Fresh or refrigerated horseradish. Lemon juice. Vinegar. Homemade, reduced-sodium, or low-sodium soups.  Unsalted popcorn and pretzels. Low-salt or salt-free chips. What foods are not recommended? The items listed may not be a complete  list. Talk with your dietitian about what dietary choices are best for you. Grains Instant hot cereals. Bread stuffing, pancake, and biscuit mixes. Croutons. Seasoned rice or pasta mixes. Noodle soup cups. Boxed or frozen macaroni and cheese. Regular salted crackers. Self-rising flour. Vegetables Sauerkraut, pickled vegetables, and relishes. Olives. [place] fries. Onion rings. Regular canned vegetables (not low-sodium or reduced-sodium). Regular canned tomato sauce and paste (not low-sodium or reduced-sodium). Regular tomato and vegetable juice (not low-sodium or reduced-sodium). Frozen vegetables in sauces. Meats and other protein foods Meat or fish that is salted, canned, smoked, spiced, or pickled. Bacon, ham, sausage, hotdogs, corned beef, chipped beef, packaged lunch meats, salt pork, jerky, pickled herring, anchovies, regular canned tuna, sardines, salted nuts. Dairy Processed cheese and cheese spreads. Cheese curds. Blue cheese. Feta cheese. String cheese. Regular cottage cheese. Buttermilk. Canned milk. Fats and oils Salted butter. Regular margarine. Ghee. Bacon fat. Seasonings and other foods Onion salt, garlic salt, seasoned salt, table salt, and sea salt. Canned and packaged gravies. Worcestershire sauce. Tartar sauce. Barbecue sauce. Teriyaki sauce. Soy sauce, including reduced-sodium. Steak sauce. Fish sauce. Oyster sauce. Cocktail sauce. Horseradish that you find on the shelf. Regular ketchup and mustard. Meat flavorings and tenderizers. Bouillon cubes. Hot sauce and Tabasco sauce. Premade or packaged marinades. Premade or packaged taco seasonings. Relishes. Regular salad dressings. Salsa. Potato and tortilla chips. Corn chips and puffs. Salted popcorn and pretzels. Canned or dried soups. Pizza. Frozen entrees and pot pies. Summary  Eating less sodium can help lower your blood pressure, reduce swelling, and protect your heart, liver, and kidneys.  Most people on this plan should limit  their sodium intake to 1,500-2,000 mg (milligrams) of sodium each day.  Canned, boxed, and frozen foods are high in sodium. Restaurant foods, fast foods, and pizza are also very high in sodium. You also get sodium by adding salt to food.  Try to cook at home, eat more fresh fruits and vegetables, and eat less fast food, canned, processed, or prepared foods. This information is not intended to replace advice given to you by your health care provider. Make sure you discuss any questions you have with your health care provider. Document Released: 09/26/2001 Document Revised: 03/30/2016 Document Reviewed: 03/30/2016 Elsevier Interactive Patient Education  2017 [place].  Heart Failure Heart failure means your heart has trouble pumping blood. This makes it hard for your body to work well. Heart failure is usually a long-term (chronic) condition. You must take good care of yourself and follow your doctor's treatment plan. Follow these instructions at home:  Take your heart medicine as told by your doctor. ? Do not stop taking medicine unless your doctor tells you to. ? Do not skip any dose of medicine. ? Refill your medicines before they run out. ? Take other medicines only as told by your doctor or pharmacist.  Stay active if told by your doctor. The elderly and people with severe heart failure should talk with a doctor about physical activity.  Eat heart-healthy foods. Choose foods that are without trans fat and are low in saturated fat, cholesterol, and salt (sodium). This includes fresh or frozen fruits and vegetables, fish, lean meats, fat-free or low-fat dairy foods, whole grains, and high-fiber foods. Lentils and dried peas and beans (legumes) are also good choices.  Limit salt if told by your doctor.  Cook in a healthy way.  Roast, grill, broil, bake, poach, steam, or stir-fry foods.  Limit fluids as told by your doctor.  Weigh yourself every morning. Do this after you pee  (urinate) and before you eat breakfast. Write down your weight to give to your doctor.  Take your blood pressure and write it down if your doctor tells you to.  Ask your doctor how to check your pulse. Check your pulse as told.  Lose weight if told by your doctor.  Stop smoking or chewing tobacco. Do not use gum or patches that help you quit without your doctor's approval.  Schedule and go to doctor visits as told.  Nonpregnant women should have no more than 1 drink a day. Men should have no more than 2 drinks a day. Talk to your doctor about drinking alcohol.  Stop illegal drug use.  Stay current with shots (immunizations).  Manage your health conditions as told by your doctor.  Learn to manage your stress.  Rest when you are tired.  If it is really hot outside: ? Avoid intense activities. ? Use air conditioning or fans, or get in a cooler place. ? Avoid caffeine and alcohol. ? Wear loose-fitting, lightweight, and light-colored clothing.  If it is really cold outside: ? Avoid intense activities. ? Layer your clothing. ? Wear mittens or gloves, a hat, and a scarf when going outside. ? Avoid alcohol.  Learn about heart failure and get support as needed.  Get help to maintain or improve your quality of life and your ability to care for yourself as needed. Contact a doctor if:  You gain weight quickly.  You are more short of breath than usual.  You cannot do your normal activities.  You tire easily.  You cough more than normal, especially with activity.  You have any or more puffiness (swelling) in areas such as your hands, feet, ankles, or belly (abdomen).  You cannot sleep because it is hard to breathe.  You feel like your heart is beating fast (palpitations).  You get dizzy or light-headed when you stand up. Get help right away if:  You have trouble breathing.  There is a change in mental status, such as becoming less alert or not being able to  focus.  You have chest pain or discomfort.  You faint. This information is not intended to replace advice given to you by your health care provider. Make sure you discuss any questions you have with your health care provider. Document Released: 01/14/2008 Document Revised: 09/12/2015 Document Reviewed: 05/23/2012 Elsevier Interactive Patient Education  2017 [employer].

## 2016-12-11 NOTE — Progress Notes (Signed)
Subjective:  Patient ID: Thomas [name], male    DOB: 1951/02/11  Age: 66 y.o. MRN: [id number]  CC: Follow-up (medication refills/legs swelling?laxitive consult?)   Constipation  This is a chronic problem. The current episode started more than 1 year ago. The problem has been waxing and waning since onset. His stool frequency is 1 time per week or less. The stool is described as pellet like. The patient is not on a high fiber diet. He does not exercise regularly. There has not been adequate water intake. Associated symptoms include bloating. Pertinent negatives include no abdominal pain, anorexia, back pain, diarrhea, difficulty urinating, fecal incontinence, fever, hematochezia, melena, nausea, rectal pain or vomiting. Risk factors include obesity and recent illness. He has tried laxatives for the symptoms. The treatment provided mild relief. His past medical history is significant for metabolic disease.    CHF: lasix dose was decreased due to electrolyte imbalance during hospitalization. Does not check weight at home, has not been changes to diet to lower sodium intake. Has persistent LE edema, no PND, no cough.  A-Fib: rate controlled, Has not started metoprolol because prescription was not sent. No palpitations, no dizziness, no syncope.  Home with mother.  Fall: States he fell because of loose rug at home. He has since moved rugs.  Outpatient Medications Prior to Visit  Medication Sig Dispense Refill  . aspirin EC 325 MG tablet Take 1 tablet (325 mg total) by mouth 2 (two) times daily. 30 tablet 0  . lidocaine (LIDODERM) 5 % Place 1 patch onto the skin 2 (two) times daily as needed. Remove & Discard patch within 12 hours or as directed by MD 30 patch 3  . Multiple Vitamin (MULTIVITAMIN WITH MINERALS) TABS tablet Take 1 tablet by mouth daily.    . folic acid (FOLVITE) 1 MG tablet Take 1 tablet (1 mg total) by mouth daily. 90 tablet 1  . furosemide (LASIX) 40 MG tablet Take 1 tablet  (40 mg total) by mouth daily. 30 tablet 0  . potassium chloride SA (K-DUR,KLOR-CON) 20 MEQ tablet Take 2 tablets (40 mEq total) by mouth daily. 30 tablet 0  . thiamine 100 MG tablet Take 1 tablet (100 mg total) by mouth daily. 90 tablet 1  . methocarbamol (ROBAXIN) 500 MG tablet Take 1 tablet (500 mg total) by mouth 2 (two) times daily with a meal. (Patient not taking: Reported on 12/11/2016) 60 tablet 0  . metoprolol succinate (TOPROL-XL) 100 MG 24 hr tablet Take 1 tablet (100 mg total) by mouth daily. Take with or immediately following a meal. (Patient not taking: Reported on 12/11/2016) 30 tablet 0   No facility-administered medications prior to visit.     ROS See HPI  Objective:  BP 124/84   Pulse (!) 54   Temp 98.4 F (36.9 C)   Ht 6' (1.829 m)   Wt (!) 310 lb (140.6 kg)   SpO2 96%   BMI 42.04 kg/m   BP Readings from Last 3 Encounters:  12/11/16 124/84  11/24/16 112/70  08/24/16 138/78    Wt Readings from Last 3 Encounters:  12/11/16 (!) 310 lb (140.6 kg)  11/24/16 298 lb 11.2 oz (135.5 kg)  08/24/16 (!) 316 lb (143.3 kg)    Physical Exam  Constitutional: No distress.  Neck: No JVD present.  Cardiovascular: Normal rate.   Pulmonary/Chest: Effort normal and breath sounds normal. He has no rales.  Abdominal: Soft. Bowel sounds are normal. There is no tenderness. There is no guarding.  Musculoskeletal: He exhibits edema.  Vitals reviewed.   Lab Results  Component Value Date   WBC 5.2 12/11/2016   HGB 13.2 12/11/2016   HCT 39.0 12/11/2016   PLT 236.0 12/11/2016   GLUCOSE 98 12/11/2016   CHOL 131 11/22/2016   TRIG 78 11/22/2016   HDL 64 11/22/2016   LDLCALC 51 11/22/2016   ALT 99 (H) 11/20/2016   AST 219 (H) 11/20/2016   NA 134 (L) 12/11/2016   K 4.5 12/11/2016   CL 99 12/11/2016   CREATININE 0.97 12/11/2016   BUN 11 12/11/2016   CO2 27 12/11/2016   TSH 4.023 11/21/2016   INR 1.00 11/20/2016   HGBA1C 5.0 11/21/2016    Ct Head Wo Contrast  Result  Date: 11/21/2016 CLINICAL DATA:  66 year old male with recurrent fall, generalized weakness, slurred speech. EXAM: CT HEAD WITHOUT CONTRAST TECHNIQUE: Contiguous axial images were obtained from the base of the skull through the vertex without intravenous contrast. COMPARISON:  Neck CT 08/21/2013.  Brain MRI 08/20/2004. FINDINGS: Brain: Cerebral volume loss since 2006 appears generalized. Patchy and confluent white matter hypodensity which appears progressed since the prior MRI. No midline shift, ventriculomegaly, mass effect, evidence of mass lesion, intracranial hemorrhage or evidence of cortically based acute infarction. No cortical encephalomalacia. Vascular: Calcified atherosclerosis at the skull base. No suspicious intracranial vascular hyperdensity. Skull: Intact.  No acute osseous abnormality identified. Sinuses/Orbits: Clear aside from mild maxillary sinus mucosal thickening. Hyperplastic sinuses. Tympanic cavities and mastoids are clear. Other: No scalp hematoma identified. Visualized orbit soft tissues are within normal limits. IMPRESSION: 1.  No acute intracranial abnormality. 2. Generalized cerebral volume loss and progressed nonspecific white matter changes since 2006. Electronically Signed   By: Genevie Ann M.D.   On: 11/21/2016 20:08   Nm Pulmonary Vent And Perf (v/q Scan)  Result Date: 11/20/2016 CLINICAL DATA:  Shortness of breath for the past 3 days following a fall. Positive D-dimer. EXAM: NUCLEAR MEDICINE VENTILATION - PERFUSION LUNG SCAN TECHNIQUE: Ventilation images were obtained in multiple projections using inhaled aerosol Tc-42m DTPA. Perfusion images were obtained in multiple projections after intravenous injection of Tc-57m MAA. RADIOPHARMACEUTICALS:  30.2 mCi Technetium-22m DTPA aerosol inhalation and 4.2 mCi Technetium-71m MAA IV COMPARISON:  Portable chest obtained earlier today. FINDINGS: Ventilation: Horizontally oriented linear ventilation defect in the superior segment of the right  lower lobe. No corresponding abnormality on the portable chest radiograph obtained earlier today. Perfusion: Horizontally oriented linear perfusion defect in the superior segment of the right lower lobe, corresponding to the ventilation defect. Otherwise, normal perfusion of both lungs. IMPRESSION: Intermediate probability for pulmonary embolism. If there are no contraindications, a CT angiogram of the chest is recommended. Electronically Signed   By: Claudie Revering M.D.   On: 11/20/2016 18:51   Dg Chest Port 1 View  Result Date: 11/20/2016 CLINICAL DATA:  Tachycardia, weakness EXAM: PORTABLE CHEST 1 VIEW COMPARISON:  10/23/2015 FINDINGS: Low lung volumes and slight rotation to the left. Minor basilar atelectasis. Normal heart size and vascularity. No focal pneumonia, collapse or consolidation. Negative for edema, effusion or pneumothorax. Trachea is midline. Degenerative changes of the spine. Remote cholecystectomy noted. IMPRESSION: Low volume chest exam with basilar atelectasis. Negative for acute pneumonia or CHF. Electronically Signed   By: Jerilynn Mages.  Shick M.D.   On: 11/20/2016 11:56    Assessment & Plan:   Thomas [name] was seen today for follow-up.  Diagnoses and all orders for this visit:  Atrial fibrillation with RVR ([place]) -     CBC;  Future -     Basic metabolic panel; Future -     metoprolol succinate (TOPROL-XL) 50 MG 24 hr tablet; Take 1 tablet (50 mg total) by mouth daily. Take with or immediately following a meal.  Acute on chronic systolic (congestive) heart failure ([name]) -     CBC; Future -     Basic metabolic panel; Future -     spironolactone (ALDACTONE) 50 MG tablet; Take 1 tablet (50 mg total) by mouth daily. -     furosemide (LASIX) 40 MG tablet; Take 1 tablet (40 mg total) by mouth daily. -     potassium chloride SA (K-DUR,KLOR-CON) 20 MEQ tablet; Take 2 tablets (40 mEq total) by mouth daily.  Constipation, unspecified constipation type -     sennosides-docusate sodium (SENOKOT-S)  8.6-50 MG tablet; Take 1 tablet by mouth daily. -     lactulose (CHRONULAC) 10 GM/15ML solution; Take 30 mLs (20 g total) by mouth daily as needed for mild constipation.  Alcoholic liver disease ([name])  ETOH abuse -     folic acid (FOLVITE) 1 MG tablet; Take 1 tablet (1 mg total) by mouth daily. -     thiamine 100 MG tablet; Take 1 tablet (100 mg total) by mouth daily. -     vitamin B-12 (CYANOCOBALAMIN) 500 MCG tablet; Take 1 tablet (500 mcg total) by mouth daily.  Pancytopenia ([name]) -     folic acid (FOLVITE) 1 MG tablet; Take 1 tablet (1 mg total) by mouth daily. -     thiamine 100 MG tablet; Take 1 tablet (100 mg total) by mouth daily. -     vitamin B-12 (CYANOCOBALAMIN) 500 MCG tablet; Take 1 tablet (500 mcg total) by mouth daily.   I have changed Thomas [name]'s metoprolol succinate. I am also having him start on spironolactone, sennosides-docusate sodium, lactulose, and vitamin B-12. Additionally, I am having him maintain his multivitamin with minerals, methocarbamol, lidocaine, aspirin EC, furosemide, potassium chloride SA, folic acid, and thiamine.  Meds ordered this encounter  Medications  . metoprolol succinate (TOPROL-XL) 50 MG 24 hr tablet    Sig: Take 1 tablet (50 mg total) by mouth daily. Take with or immediately following a meal.    Dispense:  30 tablet    Refill:  3    Order Specific Question:   Supervising Provider    Answer:   Cassandria Anger [[id number]]  . spironolactone (ALDACTONE) 50 MG tablet    Sig: Take 1 tablet (50 mg total) by mouth daily.    Dispense:  30 tablet    Refill:  2    Order Specific Question:   Supervising Provider    Answer:   Cassandria Anger [[id number]]  . furosemide (LASIX) 40 MG tablet    Sig: Take 1 tablet (40 mg total) by mouth daily.    Dispense:  90 tablet    Refill:  0    Order Specific Question:   Supervising Provider    Answer:   Cassandria Anger [[id number]]  . potassium chloride SA (K-DUR,KLOR-CON) 20 MEQ tablet    Sig: Take 2  tablets (40 mEq total) by mouth daily.    Dispense:  180 tablet    Refill:  0    Order Specific Question:   Supervising Provider    Answer:   Cassandria Anger [[id number]]  . folic acid (FOLVITE) 1 MG tablet    Sig: Take 1 tablet (1 mg total) by mouth daily.    Dispense:  90 tablet    Refill:  0    Order Specific Question:   Supervising Provider    Answer:   Cassandria Anger [[id number]]  . thiamine 100 MG tablet    Sig: Take 1 tablet (100 mg total) by mouth daily.    Dispense:  90 tablet    Refill:  0    Order Specific Question:   Supervising Provider    Answer:   Cassandria Anger [[id number]]  . sennosides-docusate sodium (SENOKOT-S) 8.6-50 MG tablet    Sig: Take 1 tablet by mouth daily.    Dispense:  90 tablet    Refill:  0    Order Specific Question:   Supervising Provider    Answer:   Cassandria Anger [[id number]]  . lactulose (CHRONULAC) 10 GM/15ML solution    Sig: Take 30 mLs (20 g total) by mouth daily as needed for mild constipation.    Dispense:  120 mL    Refill:  0    Order Specific Question:   Supervising Provider    Answer:   Cassandria Anger [[id number]]  . vitamin B-12 (CYANOCOBALAMIN) 500 MCG tablet    Sig: Take 1 tablet (500 mcg total) by mouth daily.    Dispense:  90 tablet    Refill:  1    Order Specific Question:   Supervising Provider    Answer:   Cassandria Anger [[id number]]    Follow-up: Return in about 4 weeks (around 01/08/2017) for with Dr. Sharlet Salina.  Wilfred Lacy, NP

## 2016-12-15 ENCOUNTER — Ambulatory Visit: Payer: Commercial Managed Care - HMO | Admitting: Cardiology

## 2017-01-04 NOTE — Progress Notes (Signed)
Cardiology Office Note    Date:  01/05/2017   ID:  Thomas [name], DOB 04-11-1951, MRN [id number]  PCP:  Hoyt Koch, MD  Cardiologist: Dr. Radford Pax  Chief Complaint  Patient presents with  . Follow-up    History of Present Illness:  Thomas [name] is a 66 y.o. male with EtOH abuse, chronic systolic and diastolic heart failure, dilated cardiomyopathy secondary alcohol abuse, hypertension, pancytopenia secondary to alcoholism, and paroxysmal atrial fibrillation admitted with atrial fibrillation with rapid ventricular response. Patient's medications were adjusted and metoprolol changed to 100 mg once daily. Blood pressure was stable but low. He is not a candidate for anticoagulation due to noncompliance, alcohol abuse and thrombocytopenia. 2-D echo remains unchanged from prior echo LVEF 40-45% with diffuse hypokinesis. Carotid Doppler showed mild ICA stenosis bilaterally. Renal function improved.  Patient comes in today for follow-up. He never got his Toprol filled posthospitalization when he saw primary care 12/11/16 he was placed on Toprol-XL 50 mg once daily. He hasn't taken his medications yet today. His legs are starting to swell again but down overall. He eats Brendolyn Patty several times a week because he says he's hardheaded. He drinks a couple beers a day.     Past Medical History:  Diagnosis Date  . Alcoholic liver disease ([place])   . Arthritis   . Chronic atrial fibrillation ([name])    no an anticoagulation canditate due to alcohol abuse, liver cirrhosis with pancytopenia, medical noncompliance  . Chronic systolic CHF (congestive heart failure), NYHA class 2 ([place])   . DCM (dilated cardiomyopathy) ([place])    EF 30% but now improved to 40-45% by echo 2016 on medical therapy with moderate MR  . Diverticulosis of colon   . Edema extremities 07/02/2015  . ETOH abuse   . Gastroduodenitis    H Pylori positive  . Helicobacter pylori gastritis 10/19/2013  . Hypertension   .  Obesity   . Pancytopenia ([place])   . Personal history of colonic polyps - adenomas 08/21/2013  . Portal hypertensive gastropathy [facility])     Past Surgical History:  Procedure Laterality Date  . COLONOSCOPY N/A 08/21/2013   Procedure: COLONOSCOPY;  Surgeon: Jerene Bears, MD;  [facility] ENDOSCOPY;  Service: Endoscopy;  Laterality: N/A;  . ESOPHAGOGASTRODUODENOSCOPY N/A 08/21/2013   Procedure: ESOPHAGOGASTRODUODENOSCOPY (EGD);  Surgeon: Jerene Bears, MD;  Location[facility] ENDOSCOPY;  Service: Endoscopy;  Laterality: N/A;  . HIP CLOSED REDUCTION Right 08/13/2015   Procedure: CLOSED REDUCTION HIP;  Surgeon: [name], MD;  Location: [place] NEURO ORS;  Service: Orthopedics;  Laterality: Right;  . TOTAL HIP ARTHROPLASTY Right 05/20/2015   Procedure: TOTAL HIP ARTHROPLASTY;  Surgeon: Frederik Pear, MD;  Location: [place];  Service: Orthopedics;  Laterality: Right;  . TOTAL HIP REVISION Right 09/09/2015   Procedure: TOTAL HIP REVISION;  Surgeon: Frederik Pear, MD;  Location: [place];  Service: Orthopedics;  Laterality: Right;    Current Medications: Current Meds  Medication Sig  . aspirin EC 325 MG tablet Take 1 tablet (325 mg total) by mouth 2 (two) times daily.  . folic acid (FOLVITE) 1 MG tablet Take 1 tablet (1 mg total) by mouth daily.  . furosemide (LASIX) 40 MG tablet Take 1 tablet (40 mg total) by mouth daily.  Marland Kitchen lactulose (CHRONULAC) 10 GM/15ML solution Take 30 mLs (20 g total) by mouth daily as needed for mild constipation.  . lidocaine (LIDODERM) 5 % Place 1 patch onto the skin 2 (two) times daily as needed. Remove &  Discard patch within 12 hours or as directed by MD  . methocarbamol (ROBAXIN) 500 MG tablet Take 1 tablet (500 mg total) by mouth 2 (two) times daily with a meal.  . metoprolol succinate (TOPROL-XL) 50 MG 24 hr tablet Take 1 tablet (50 mg total) by mouth daily. Take with or immediately following a meal.  . Multiple Vitamin (MULTIVITAMIN WITH MINERALS) TABS tablet Take 1 tablet by mouth daily.   . potassium chloride SA (K-DUR,KLOR-CON) 20 MEQ tablet Take 2 tablets (40 mEq total) by mouth daily.  . sennosides-docusate sodium (SENOKOT-S) 8.6-50 MG tablet Take 1 tablet by mouth daily.  Marland Kitchen spironolactone (ALDACTONE) 50 MG tablet Take 1 tablet (50 mg total) by mouth daily.  Marland Kitchen thiamine 100 MG tablet Take 1 tablet (100 mg total) by mouth daily.  . vitamin B-12 (CYANOCOBALAMIN) 500 MCG tablet Take 1 tablet (500 mcg total) by mouth daily.     Allergies:   Patient has no known allergies.   Social History   Social History  . Marital status: Single    Spouse name: N/A  . Number of children: 0  . Years of education: N/A   Social History Main Topics  . Smoking status: Never Smoker  . Smokeless tobacco: Never Used  . Alcohol use 4.8 oz/week    8 Cans of beer per week     Comment: former ETOH abuse   . Drug use: Yes    Types: Marijuana     Comment: 10 yrs ago  . Sexual activity: Not Asked   Other Topics Concern  . None   Social History Narrative   Retired Sports coach city of [place]     Family History:  The patient's   family history includes Hypertension in his father.   ROS:   Please see the history of present illness.    Review of Systems  Constitution: Negative.  HENT: Negative.   Cardiovascular: Negative.   Respiratory: Negative.   Endocrine: Negative.   Hematologic/Lymphatic: Negative.   Musculoskeletal: Negative.   Gastrointestinal: Negative.   Genitourinary: Negative.   Neurological: Negative.    All other systems reviewed and are negative.   PHYSICAL EXAM:   VS:  BP 136/90   Pulse 95   Ht 6' (1.829 m)   Wt (!) 303 lb 12.8 oz (137.8 kg)   BMI 41.20 kg/m   Physical Exam  GEN: Obese, in no acute distress  Neck: Slight increase no JVD, no carotid bruits, or masses Cardiac:RRR; distant heart sounds, no murmurs, rubs, or gallops  Respiratory:  Decreased breath sounds clear to auscultation bilaterally, normal work of breathing GI: soft, nontender, nondistended,  + BS Ext: +2 edema bilaterally without cyanosis, clubbing, decreased distal pulses bilaterally Neuro:  Alert and Oriented x 3 Psych: euthymic mood, full affect  Wt Readings from Last 3 Encounters:  01/05/17 (!) 303 lb 12.8 oz (137.8 kg)  12/11/16 (!) 310 lb (140.6 kg)  11/24/16 298 lb 11.2 oz (135.5 kg)      Studies/Labs Reviewed:   EKG:  EKG is  ordered today.  The ekg ordered today demonstrates Atrial fibrillation at 95 bpm  Recent Labs: 11/20/2016: ALT 99 11/21/2016: TSH 4.023 12/11/2016: BUN 11; Creatinine, Ser 0.97; Hemoglobin 13.2; Platelets 236.0; Potassium 4.5; Sodium 134   Lipid Panel    Component Value Date/Time   CHOL 131 11/22/2016 0344   TRIG 78 11/22/2016 0344   HDL 64 11/22/2016 0344   CHOLHDL 2.0 11/22/2016 0344   VLDL 16 11/22/2016 0344  [place] 51 11/22/2016 0344    Additional studies/ records that were reviewed today include:  2-D echo 11/21/16 Study Conclusions  - Left ventricle: The cavity size was normal. Wall thickness was   normal. Systolic function was mildly to moderately reduced. The   estimated ejection fraction was in the range of 40% to 45%.   Diffuse hypokinesis. - Mitral valve: There was moderate regurgitation. - Left atrium: The atrium was mildly dilated. - Right atrium: The atrium was mildly dilated. - Pulmonary arteries: PA peak pressure: 35 mm Hg (S).      ASSESSMENT:    1. Chronic atrial fibrillation ([place])   2. DCM (dilated cardiomyopathy) ([place])   3. Acute on chronic systolic (congestive) heart failure ([place])   4. Essential hypertension      PLAN:  In order of problems listed above:  Chronic atrial fibrillation heart rate 95 bpm. Patient hasn't taken his medications today. He never got his Toprol filled posthospitalization which was supposed to be 100 mg daily primary care start him back and 50 mg daily. Will increase to 100 mg daily. Blood pressure is elevated today as well.  Dilated cardiomyopathy ejection fraction 58%  likely alcoholic. Continues to drink several Beers daily.  Acute on chronic systolic CHF increased leg edema. Eating fast food. Discussed 2 g sodium diet. Take extra Lasix 40 mg today. Follow-up with Dr. Meda Coffee. Also has follow-up with primary care in 10 days. Labs checked by primary care 12/11/16 and creatinine stable. Will not repeat.  Essential hypertension patient's blood pressure is elevated today but he hasn't has medications. Increasing Toprol to 100 mg daily.  Medication Adjustments/Labs and Tests Ordered: Current medicines are reviewed at length with the patient today.  Concerns regarding medicines are outlined above.  Medication changes, Labs and Tests ordered today are listed in the Patient Instructions below. There are no Patient Instructions on file for this visit.   Sumner Boast, PA-C  01/05/2017 9:30 AM    [facility] [address], [place], [place]  [address] Phone: [phone]; Fax: [phone]

## 2017-01-05 ENCOUNTER — Ambulatory Visit (INDEPENDENT_AMBULATORY_CARE_PROVIDER_SITE_OTHER): Payer: 59 | Admitting: Physician Assistant

## 2017-01-05 ENCOUNTER — Encounter: Payer: Self-pay | Admitting: Physician Assistant

## 2017-01-05 ENCOUNTER — Encounter (INDEPENDENT_AMBULATORY_CARE_PROVIDER_SITE_OTHER): Payer: Self-pay

## 2017-01-05 VITALS — BP 136/90 | HR 95 | Ht 72.0 in | Wt 303.8 lb

## 2017-01-05 DIAGNOSIS — I482 Chronic atrial fibrillation, unspecified: Secondary | ICD-10-CM

## 2017-01-05 DIAGNOSIS — I420 Dilated cardiomyopathy: Secondary | ICD-10-CM | POA: Diagnosis not present

## 2017-01-05 DIAGNOSIS — I5023 Acute on chronic systolic (congestive) heart failure: Secondary | ICD-10-CM | POA: Diagnosis not present

## 2017-01-05 DIAGNOSIS — I4891 Unspecified atrial fibrillation: Secondary | ICD-10-CM

## 2017-01-05 DIAGNOSIS — I10 Essential (primary) hypertension: Secondary | ICD-10-CM

## 2017-01-05 MED ORDER — FUROSEMIDE 40 MG PO TABS: 40.0000 mg | ORAL_TABLET | Freq: Every day | ORAL | 3 refills | Status: AC

## 2017-01-05 MED ORDER — SPIRONOLACTONE 50 MG PO TABS: 50.0000 mg | ORAL_TABLET | Freq: Every day | ORAL | 3 refills | Status: AC

## 2017-01-05 MED ORDER — METOPROLOL SUCCINATE ER 100 MG PO TB24: 100.0000 mg | ORAL_TABLET | Freq: Every day | ORAL | 3 refills | Status: AC

## 2017-01-05 MED ORDER — POTASSIUM CHLORIDE CRYS ER 20 MEQ PO TBCR: 40.0000 meq | EXTENDED_RELEASE_TABLET | Freq: Every day | ORAL | 3 refills | Status: AC

## 2017-01-05 NOTE — Patient Instructions (Signed)
Medication Instructions:  Your physician has recommended you make the following change in your medication:  1. Increase Metoprolol (100 mg ) daily, sent in today to patient's requested pharmacy. 2. All refills sent in today 3. Take an extra lasix (40 mg )  today   Labwork: -None  Testing/Procedures: -None  Follow-Up: Your physician recommends that you keep your  scheduled  follow-up appointment with Dr. Radford [name].    Any Other Special Instructions Will Be Listed Below (If Applicable).   Heart-Healthy Eating Plan Many factors influence your heart health, including eating and exercise habits. Heart (coronary) risk increases with abnormal blood fat (lipid) levels. Heart-healthy meal planning includes limiting unhealthy fats, increasing healthy fats, and making other small dietary changes. This includes maintaining a healthy body weight to help keep lipid levels within a normal range. What is my plan? Your health care provider recommends that you:  Get no more than _________% of the total calories in your daily diet from fat.  Limit your intake of saturated fat to less than _________% of your total calories each day.  Limit the amount of cholesterol in your diet to less than _____2000____ mg per day.  What types of fat should I choose?  Choose healthy fats more often. Choose monounsaturated and polyunsaturated fats, such as olive oil and canola oil, flaxseeds, walnuts, almonds, and seeds.  Eat more omega-3 fats. Good choices include salmon, mackerel, sardines, tuna, flaxseed oil, and ground flaxseeds. Aim to eat fish at least two times each week.  Limit saturated fats. Saturated fats are primarily found in animal products, such as meats, butter, and cream. Plant sources of saturated fats include palm oil, palm kernel oil, and coconut oil.  Avoid foods with partially hydrogenated oils in them. These contain trans fats. Examples of foods that contain trans fats are stick margarine, some  tub margarines, cookies, crackers, and other baked goods. What general guidelines do I need to follow?  Check food labels carefully to identify foods with trans fats or high amounts of saturated fat.  Fill one half of your plate with vegetables and green salads. Eat 4-5 servings of vegetables per day. A serving of vegetables equals 1 cup of raw leafy vegetables,  cup of raw or cooked cut-up vegetables, or  cup of vegetable juice.  Fill one fourth of your plate with whole grains. Look for the word "whole" as the first word in the ingredient list.  Fill one fourth of your plate with lean protein foods.  Eat 4-5 servings of fruit per day. A serving of fruit equals one medium whole fruit,  cup of dried fruit,  cup of fresh, frozen, or canned fruit, or  cup of 100% fruit juice.  Eat more foods that contain soluble fiber. Examples of foods that contain this type of fiber are apples, broccoli, carrots, beans, peas, and barley. Aim to get 20-30 g of fiber per day.  Eat more home-cooked food and less restaurant, buffet, and fast food.  Limit or avoid alcohol.  Limit foods that are high in starch and sugar.  Avoid fried foods.  Cook foods by using methods other than frying. Baking, boiling, grilling, and broiling are all great options. Other fat-reducing suggestions include: ? Removing the skin from poultry. ? Removing all visible fats from meats. ? Skimming the fat off of stews, soups, and gravies before serving them. ? Steaming vegetables in water or broth.  Lose weight if you are overweight. Losing just 5-10% of your initial body weight can  help your overall health and prevent diseases such as diabetes and heart disease.  Increase your consumption of nuts, legumes, and seeds to 4-5 servings per week. One serving of dried beans or legumes equals  cup after being cooked, one serving of nuts equals 1 ounces, and one serving of seeds equals  ounce or 1 tablespoon.  You may need to  monitor your salt (sodium) intake, especially if you have high blood pressure. Talk with your health care provider or dietitian to get more information about reducing sodium. What foods can I eat? Grains  Breads, including [place], white, pita, wheat, raisin, rye, oatmeal, and [place]. Tortillas that are neither fried nor made with lard or trans fat. Low-fat rolls, including hotdog and hamburger buns and English muffins. Biscuits. Muffins. Waffles. Pancakes. Light popcorn. Whole-grain cereals. Flatbread. Melba toast. Pretzels. Breadsticks. Rusks. Low-fat snacks and crackers, including oyster, saltine, matzo, graham, animal, and rye. Rice and pasta, including brown rice and those that are made with whole wheat. Vegetables All vegetables. Fruits All fruits, but limit coconut. Meats and Other Protein Sources Lean, well-trimmed beef, veal, pork, and lamb. Chicken and [place] without skin. All fish and shellfish. Wild duck, rabbit, pheasant, and venison. Egg whites or low-cholesterol egg substitutes. Dried beans, peas, lentils, and tofu.Seeds and most nuts. Dairy Low-fat or nonfat cheeses, including ricotta, string, and mozzarella. Skim or 1% milk that is liquid, powdered, or evaporated. Buttermilk that is made with low-fat milk. Nonfat or low-fat yogurt. Beverages Mineral water. Diet carbonated beverages. Sweets and Desserts Sherbets and fruit ices. Honey, jam, marmalade, jelly, and syrups. Meringues and gelatins. Pure sugar candy, such as hard candy, jelly beans, gumdrops, mints, marshmallows, and small amounts of dark chocolate. [employer] Eat all sweets and desserts in moderation. Fats and Oils Nonhydrogenated (trans-free) margarines. Vegetable oils, including soybean, sesame, sunflower, olive, peanut, safflower, corn, canola, and cottonseed. Salad dressings or mayonnaise that are made with a vegetable oil. Limit added fats and oils that you use for cooking, baking, salads, and as  spreads. Other Cocoa powder. Coffee and tea. All seasonings and condiments. The items listed above may not be a complete list of recommended foods or beverages. Contact your dietitian for more options. What foods are not recommended? Grains Breads that are made with saturated or trans fats, oils, or whole milk. Croissants. Butter rolls. Cheese breads. Sweet rolls. Donuts. Buttered popcorn. Chow mein noodles. High-fat crackers, such as cheese or butter crackers. Meats and Other Protein Sources Fatty meats, such as hotdogs, short ribs, sausage, spareribs, bacon, ribeye roast or steak, and mutton. High-fat deli meats, such as salami and bologna. Caviar. Domestic duck and goose. Organ meats, such as kidney, liver, sweetbreads, brains, gizzard, chitterlings, and heart. Dairy Cream, sour cream, cream cheese, and creamed cottage cheese. Whole milk cheeses, including blue (bleu), Monte[place] Jack, [place], [place], [place], [place], [place], [place], [place], and [place]. Whole or 2% milk that is liquid, evaporated, or condensed. Whole buttermilk. Cream sauce or high-fat cheese sauce. Yogurt that is made from whole milk. Beverages Regular sodas and drinks with added sugar. Sweets and Desserts Frosting. Pudding. Cookies. Cakes other than angel food cake. Candy that has milk chocolate or white chocolate, hydrogenated fat, butter, coconut, or unknown ingredients. Buttered syrups. Full-fat ice cream or ice cream drinks. Fats and Oils Gravy that has suet, meat fat, or shortening. Cocoa butter, hydrogenated oils, palm oil, coconut oil, palm kernel oil. These can often be found in baked products, candy, fried foods, nondairy creamers, and whipped toppings.  Solid fats and shortenings, including bacon fat, salt pork, lard, and butter. Nondairy cream substitutes, such as coffee creamers and sour cream substitutes. Salad dressings that are made of unknown oils, cheese, or sour cream. The items listed above may not be a  complete list of foods and beverages to avoid. Contact your dietitian for more information. This information is not intended to replace advice given to you by your health care provider. Make sure you discuss any questions you have with your health care provider. Document Released: 01/14/2008 Document Revised: 10/25/2015 Document Reviewed: 09/28/2013 Elsevier Interactive Patient Education  2017 [employer].    If you need a refill on your cardiac medications before your next appointment, please call your pharmacy.

## 2017-01-15 ENCOUNTER — Encounter: Payer: Self-pay | Admitting: Internal Medicine

## 2017-01-15 ENCOUNTER — Ambulatory Visit (INDEPENDENT_AMBULATORY_CARE_PROVIDER_SITE_OTHER): Payer: 59 | Admitting: Internal Medicine

## 2017-01-15 DIAGNOSIS — K709 Alcoholic liver disease, unspecified: Secondary | ICD-10-CM

## 2017-01-15 DIAGNOSIS — I5022 Chronic systolic (congestive) heart failure: Secondary | ICD-10-CM

## 2017-01-15 DIAGNOSIS — K5900 Constipation, unspecified: Secondary | ICD-10-CM | POA: Diagnosis not present

## 2017-01-15 DIAGNOSIS — M1611 Unilateral primary osteoarthritis, right hip: Secondary | ICD-10-CM

## 2017-01-15 MED ORDER — LACTULOSE 10 GM/15ML PO SOLN: 20.0000 g | Freq: Every day | ORAL | 11 refills | Status: AC | PRN

## 2017-01-15 MED ORDER — LIDOCAINE 5 % EX PTCH: 1.0000 | MEDICATED_PATCH | Freq: Two times a day (BID) | CUTANEOUS | 11 refills | Status: AC | PRN

## 2017-01-15 NOTE — Progress Notes (Signed)
   Subjective:    Patient ID: Thomas [name], male    DOB: [birth year], 66 y.o.   MRN: [id number]  HPI The patient is a 66 YO man coming in for follow up of medical conditions including his chronic systolic heart failure (EF 40%, likely due to alcohol abuse and non-compliance previously with meds, he is taking his metoprolol and spironolactone and lasix now, not on ACE-I or ARB at this time, he does not do low sodium diet and still is drinking sodas and 1-2 beers daily, some increase in swelling which fluctuates daily, he does not check weight at home), and his constipation (taking lactulose and senokot and this is helping him significantly, he feels his medications make his constipated, does not eat much fiber or drink a lot of water), and his alcoholic liver disease (admits to drinking 1-2 beers per day, does not recall anyone talking to him about negative effects of his drinking previously, he is not aware of any harm to his health from alcohol).  Review of Systems  Constitutional: Positive for activity change. Negative for appetite change, chills, fatigue, fever and unexpected weight change.  HENT: Negative.   Eyes: Negative.   Respiratory: Positive for shortness of breath. Negative for cough and chest tightness.   Cardiovascular: Negative for chest pain, palpitations and leg swelling.  Gastrointestinal: Negative for abdominal distention, abdominal pain, constipation, diarrhea, nausea and vomiting.  Musculoskeletal: Positive for arthralgias and gait problem.  Skin: Negative.   Neurological: Negative for dizziness, seizures and numbness.  Psychiatric/Behavioral: Negative.       Objective:   Physical Exam  Constitutional: He is oriented to person, place, and time. He appears well-developed and well-nourished.  Overweight  HENT:  Head: Normocephalic and atraumatic.  Eyes: EOM are normal.  Neck: Normal range of motion.  Cardiovascular: Normal rate and regular rhythm.   Pulmonary/Chest:  Effort normal and breath sounds normal. No respiratory distress. He has no wheezes. He has no rales.  Abdominal: Soft. Bowel sounds are normal. He exhibits no distension. There is no tenderness. There is no rebound.  Musculoskeletal: He exhibits edema.  1+ edema to midshin bilaterally  Neurological: He is alert and oriented to person, place, and time. Coordination normal.  Skin: Skin is warm and dry.  Psychiatric: He has a normal mood and affect.   Vitals:   01/15/17 0819  BP: 116/80  Pulse: 100  Temp: 98.4 F (36.9 C)  TempSrc: Oral  SpO2: 98%  Weight: (!) 308 lb (139.7 kg)  Height: 6' (1.829 m)      Assessment & Plan:

## 2017-01-15 NOTE — Patient Instructions (Signed)
We are not checking the labs today.   Keep the same medicines, we have sent in refills.   Work on cutting down the alcohol and the salty foods to help the heart.    DASH Eating Plan DASH stands for "Dietary Approaches to Stop Hypertension." The DASH eating plan is a healthy eating plan that has been shown to reduce high blood pressure (hypertension). It may also reduce your risk for type 2 diabetes, heart disease, and stroke. The DASH eating plan may also help with weight loss. What are tips for following this plan? General guidelines  Avoid eating more than 2,300 mg (milligrams) of salt (sodium) a day. If you have hypertension, you may need to reduce your sodium intake to 1,500 mg a day.  Limit alcohol intake to no more than 1 drink a day for nonpregnant women and 2 drinks a day for men. One drink equals 12 oz of beer, 5 oz of wine, or 1 oz of hard liquor.  Work with your health care provider to maintain a healthy body weight or to lose weight. Ask what an ideal weight is for you.  Get at least 30 minutes of exercise that causes your heart to beat faster (aerobic exercise) most days of the week. Activities may include walking, swimming, or biking.  Work with your health care provider or diet and nutrition specialist (dietitian) to adjust your eating plan to your individual calorie needs. Reading food labels  Check food labels for the amount of sodium per serving. Choose foods with less than 5 percent of the Daily Value of sodium. Generally, foods with less than 300 mg of sodium per serving fit into this eating plan.  To find whole grains, look for the word "whole" as the first word in the ingredient list. Shopping  Buy products labeled as "low-sodium" or "no salt added."  Buy fresh foods. Avoid canned foods and premade or frozen meals. Cooking  Avoid adding salt when cooking. Use salt-free seasonings or herbs instead of table salt or sea salt. Check with your health care provider  or pharmacist before using salt substitutes.  Do not fry foods. Cook foods using healthy methods such as baking, boiling, grilling, and broiling instead.  Cook with heart-healthy oils, such as olive, canola, soybean, or sunflower oil. Meal planning   Eat a balanced diet that includes: ? 5 or more servings of fruits and vegetables each day. At each meal, try to fill half of your plate with fruits and vegetables. ? Up to 6-8 servings of whole grains each day. ? Less than 6 oz of lean meat, poultry, or fish each day. A 3-oz serving of meat is about the same size as a deck of cards. One egg equals 1 oz. ? 2 servings of low-fat dairy each day. ? A serving of nuts, seeds, or beans 5 times each week. ? Heart-healthy fats. Healthy fats called Omega-3 fatty acids are found in foods such as flaxseeds and coldwater fish, like sardines, salmon, and mackerel.  Limit how much you eat of the following: ? Canned or prepackaged foods. ? Food that is high in trans fat, such as fried foods. ? Food that is high in saturated fat, such as fatty meat. ? Sweets, desserts, sugary drinks, and other foods with added sugar. ? Full-fat dairy products.  Do not salt foods before eating.  Try to eat at least 2 vegetarian meals each week.  Eat more home-cooked food and less restaurant, buffet, and fast food.  When  eating at a restaurant, ask that your food be prepared with less salt or no salt, if possible. What foods are recommended? The items listed may not be a complete list. Talk with your dietitian about what dietary choices are best for you. Grains Whole-grain or whole-wheat bread. Whole-grain or whole-wheat pasta. Brown rice. [place]. Bulgur. Whole-grain and low-sodium cereals. Pita bread. Low-fat, low-sodium crackers. Whole-wheat flour tortillas. Vegetables Fresh or frozen vegetables (raw, steamed, roasted, or grilled). Low-sodium or reduced-sodium tomato and vegetable juice. Low-sodium or  reduced-sodium tomato sauce and tomato paste. Low-sodium or reduced-sodium canned vegetables. Fruits All fresh, dried, or frozen fruit. Canned fruit in natural juice (without added sugar). Meat and other protein foods Skinless chicken or [place]. Ground chicken or [place]. Pork with fat trimmed off. Fish and seafood. Egg whites. Dried beans, peas, or lentils. Unsalted nuts, nut butters, and seeds. Unsalted canned beans. Lean cuts of beef with fat trimmed off. Low-sodium, lean deli meat. Dairy Low-fat (1%) or fat-free (skim) milk. Fat-free, low-fat, or reduced-fat cheeses. Nonfat, low-sodium ricotta or cottage cheese. Low-fat or nonfat yogurt. Low-fat, low-sodium cheese. Fats and oils Soft margarine without trans fats. Vegetable oil. Low-fat, reduced-fat, or light mayonnaise and salad dressings (reduced-sodium). Canola, safflower, olive, soybean, and sunflower oils. Avocado. Seasoning and other foods Herbs. Spices. Seasoning mixes without salt. Unsalted popcorn and pretzels. Fat-free sweets. What foods are not recommended? The items listed may not be a complete list. Talk with your dietitian about what dietary choices are best for you. Grains Baked goods made with fat, such as croissants, muffins, or some breads. Dry pasta or rice meal packs. Vegetables Creamed or fried vegetables. Vegetables in a cheese sauce. Regular canned vegetables (not low-sodium or reduced-sodium). Regular canned tomato sauce and paste (not low-sodium or reduced-sodium). Regular tomato and vegetable juice (not low-sodium or reduced-sodium). Angie Fava. Olives. Fruits Canned fruit in a light or heavy syrup. Fried fruit. Fruit in cream or butter sauce. Meat and other protein foods Fatty cuts of meat. Ribs. Fried meat. Berniece Salines. Sausage. Bologna and other processed lunch meats. Salami. Fatback. Hotdogs. Bratwurst. Salted nuts and seeds. Canned beans with added salt. Canned or smoked fish. Whole eggs or egg yolks. Chicken or [place]  with skin. Dairy Whole or 2% milk, cream, and half-and-half. Whole or full-fat cream cheese. Whole-fat or sweetened yogurt. Full-fat cheese. Nondairy creamers. Whipped toppings. Processed cheese and cheese spreads. Fats and oils Butter. Stick margarine. Lard. Shortening. Ghee. Bacon fat. Tropical oils, such as coconut, palm kernel, or palm oil. Seasoning and other foods Salted popcorn and pretzels. Onion salt, garlic salt, seasoned salt, table salt, and sea salt. Worcestershire sauce. Tartar sauce. Barbecue sauce. Teriyaki sauce. Soy sauce, including reduced-sodium. Steak sauce. Canned and packaged gravies. Fish sauce. Oyster sauce. Cocktail sauce. Horseradish that you find on the shelf. Ketchup. Mustard. Meat flavorings and tenderizers. Bouillon cubes. Hot sauce and Tabasco sauce. Premade or packaged marinades. Premade or packaged taco seasonings. Relishes. Regular salad dressings. Where to find more information:  National Heart, Lung, and [place]: https://[employer]-eaton.com/  American Heart Association: www.heart.org Summary  The DASH eating plan is a healthy eating plan that has been shown to reduce high blood pressure (hypertension). It may also reduce your risk for type 2 diabetes, heart disease, and stroke.  With the DASH eating plan, you should limit salt (sodium) intake to 2,300 mg a day. If you have hypertension, you may need to reduce your sodium intake to 1,500 mg a day.  When on the DASH eating plan, aim  to eat more fresh fruits and vegetables, whole grains, lean proteins, low-fat dairy, and heart-healthy fats.  Work with your health care provider or diet and nutrition specialist (dietitian) to adjust your eating plan to your individual calorie needs. This information is not intended to replace advice given to you by your health care provider. Make sure you discuss any questions you have with your health care provider. Document Released: 03/26/2011 Document Revised: 03/30/2016  Document Reviewed: 03/30/2016 Elsevier Interactive Patient Education  2017 [employer].

## 2017-01-15 NOTE — Assessment & Plan Note (Signed)
Talked to him about health complications from alcohol and is advised to cut back on beer. He admits to 2 beers daily. Is on beta blocker and spironolactone. No ascites. He does have systolic heart failure in part related to his alcohol usage.

## 2017-01-15 NOTE — Assessment & Plan Note (Signed)
Not on ACE-I or ARB due to recent AKI although this should be restarted in 1-2 months if Cr still stable. He is taking spironolactone, lasix, metoprolol. He is trying to take his medications but compliance has been a struggle for him in the past. Reminded him about how important this is to keep him out of the hospital to take his medications and work on diet.

## 2017-01-15 NOTE — Assessment & Plan Note (Signed)
Refill lidoderm patches which help with his pain.

## 2017-01-15 NOTE — Assessment & Plan Note (Signed)
Lactulose has been helpful in his constipation. He is sometimes using senokot-d but not regularly. He is reminded about the need to increase water intake and fiber and he will work on this.

## 2017-02-23 ENCOUNTER — Ambulatory Visit: Payer: 59 | Admitting: Cardiology

## 2017-04-16 ENCOUNTER — Ambulatory Visit: Payer: 59 | Admitting: Internal Medicine

## 2017-05-11 ENCOUNTER — Telehealth: Payer: Self-pay

## 2017-05-11 NOTE — Telephone Encounter (Signed)
On 05/11/17 I received a d/c from [facility] (original). The d/c is for burial. The patient is a patient of Doctor Crawford. The d/c will be taken to [facility] for signature.  On 05/12/17 I received the d/c back from Doctor Sharlet [place].  I got the d/c ready and called the funeral home to let them know the d/c is ready for pickup.

## 2017-05-21 DEATH — deceased

## 2018-12-26 IMAGING — CT CT HEAD W/O CM
4 series · 16 of 47 positions shown, 18 images · non-contrast
Comparison: Neck CT 08/21/2013.  Brain MRI 08/20/2004.

CLINICAL DATA: 65-year-old male with recurrent fall, generalized
weakness, slurred speech.

EXAM:
CT HEAD WITHOUT CONTRAST
TECHNIQUE: Contiguous axial images were obtained from the base of the skull
through the vertex without intravenous contrast.

[Series 3: head without · axial · non-contrast · 0.47mm/px · z∈[-114,+21]mm · 7 of 37 slices shown, 9 images]
[im 5/37  brain]
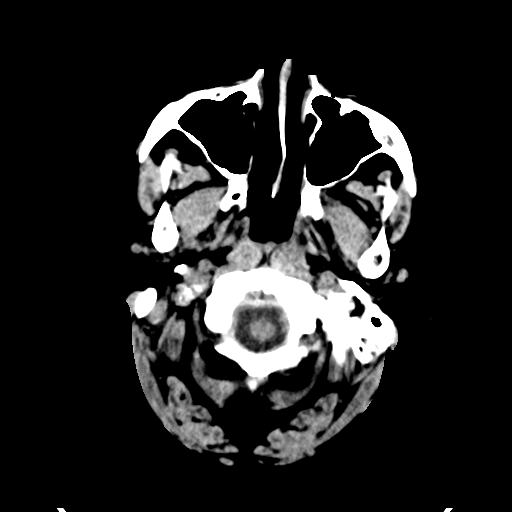
[im 5/37  bone]
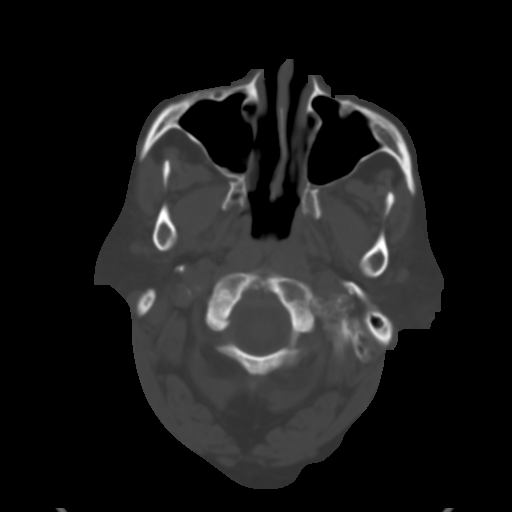
[im 10/37  brain]
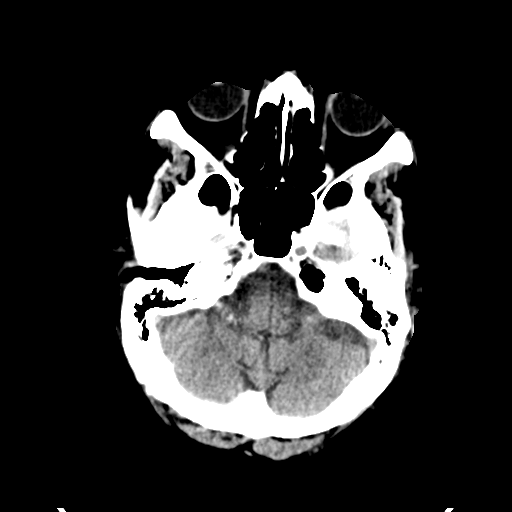
[im 14/37  brain]
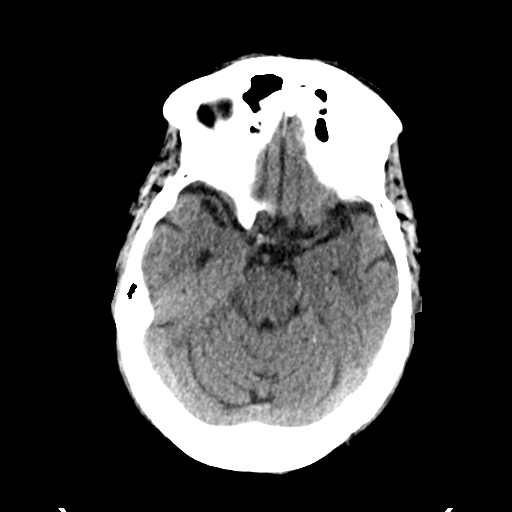
[im 19/37  brain]
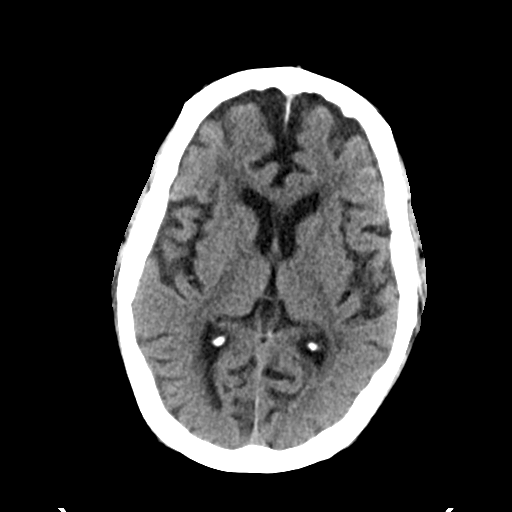
[im 23/37  brain]
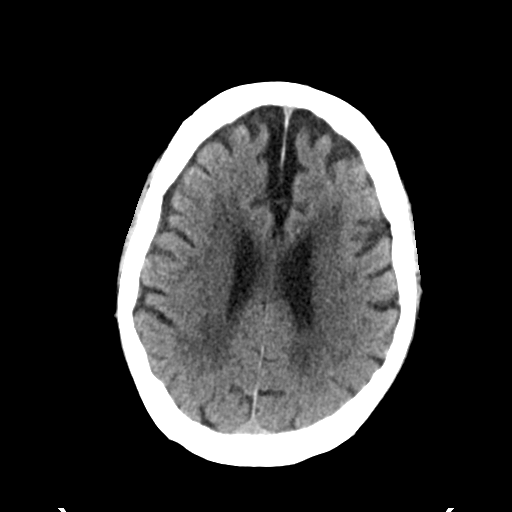
[im 23/37  bone]
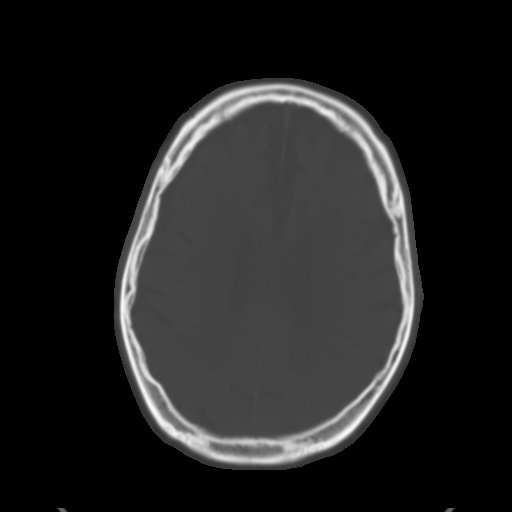
[im 28/37  brain]
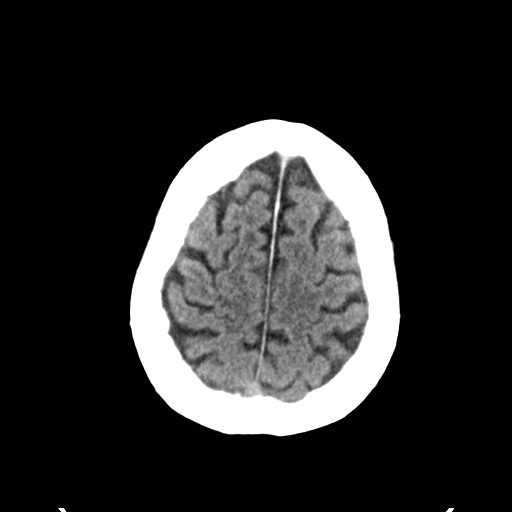
[im 32/37  brain]
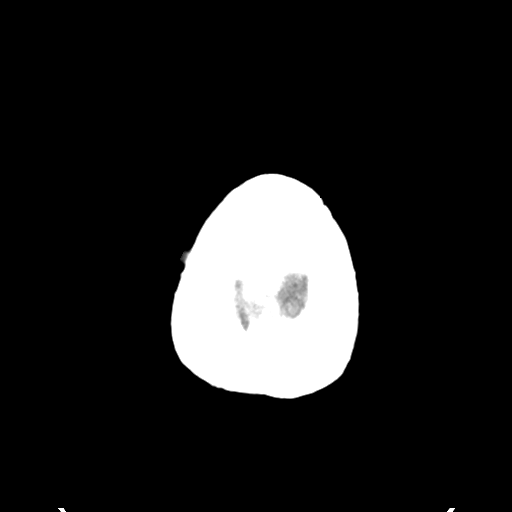

[Series 4: head bone · axial · 0.47mm/px · z∈[-116,-80]mm · 3 of 93 slices shown]
[im 10/93  bone]
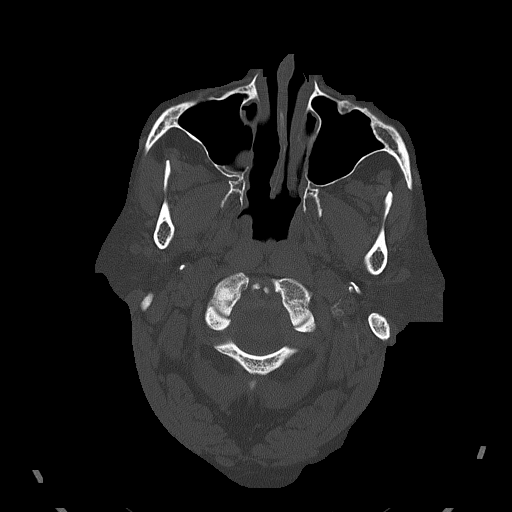
[im 19/93  bone]
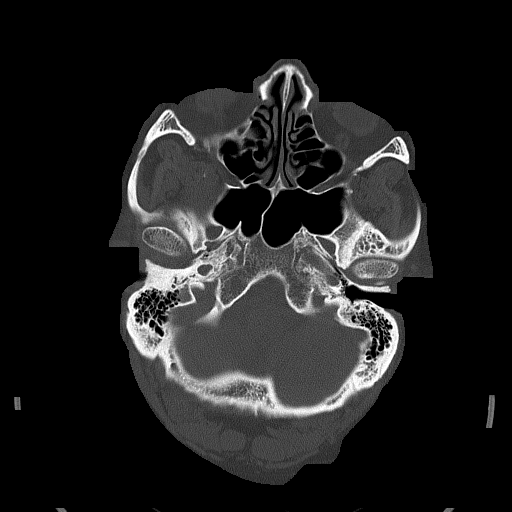
[im 28/93  bone]
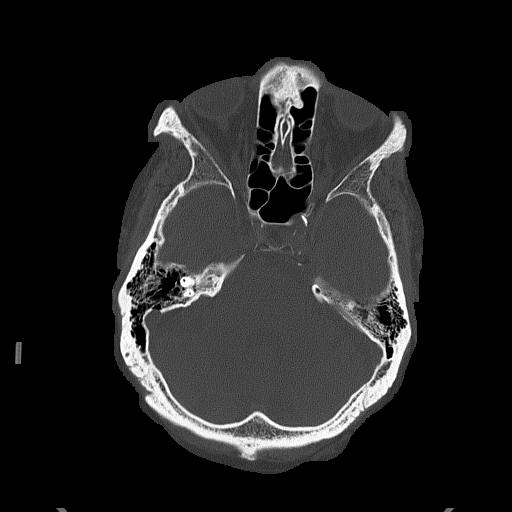

[Series 5: head without cor · coronal · non-contrast · 0.37mm/px · 3 of 67 slices shown]
[im 23/67  brain]
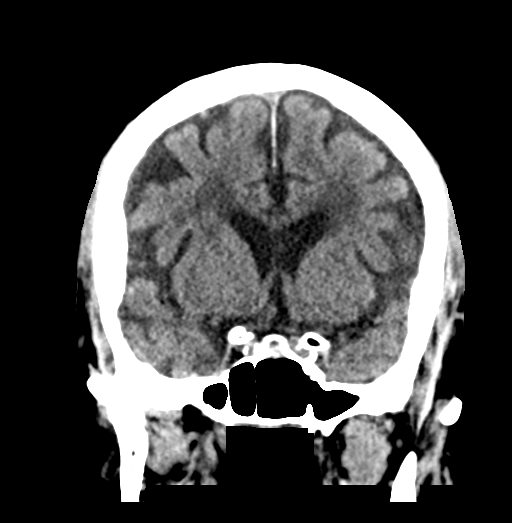
[im 30/67  brain]
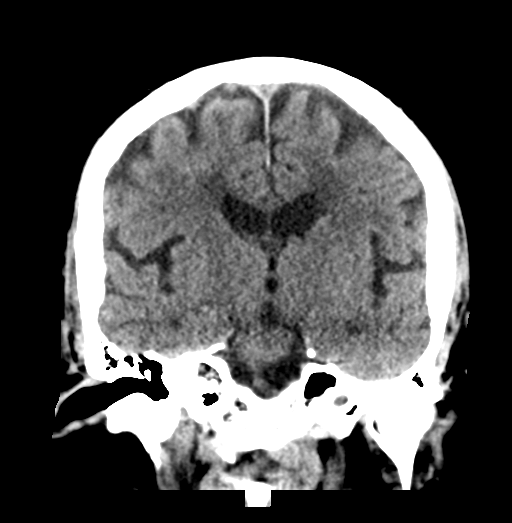
[im 37/67  brain]
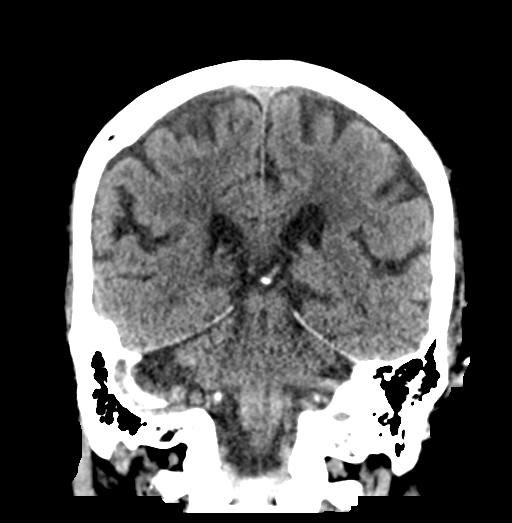

[Series 6: head without sag · sagittal · non-contrast · 0.36mm/px · 3 of 67 slices shown]
[im 23/67  brain]
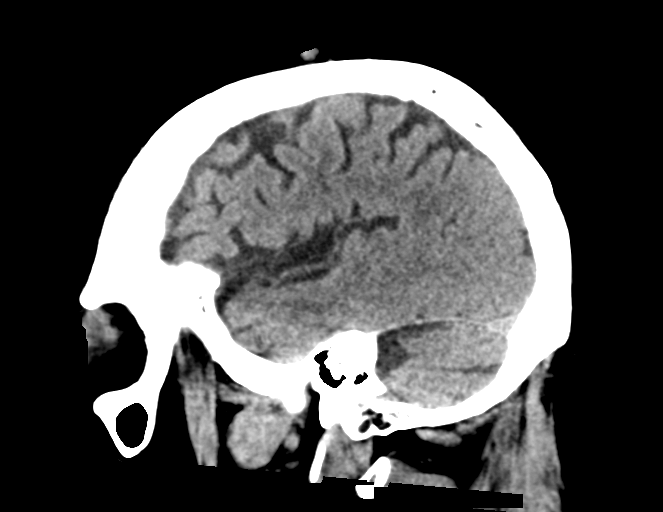
[im 34/67  brain]
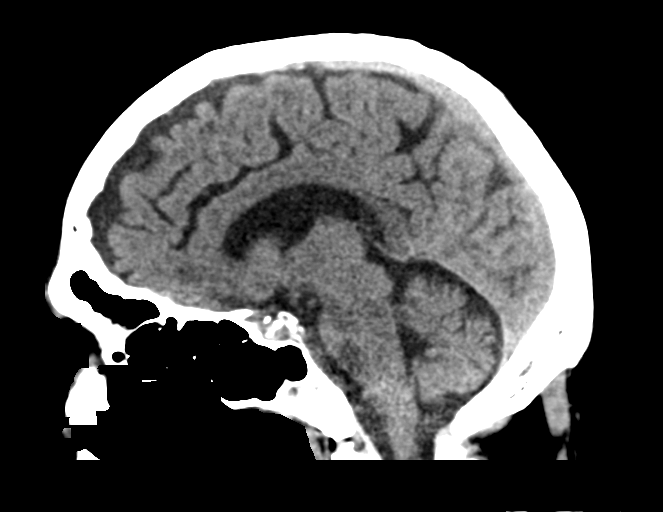
[im 45/67  brain]
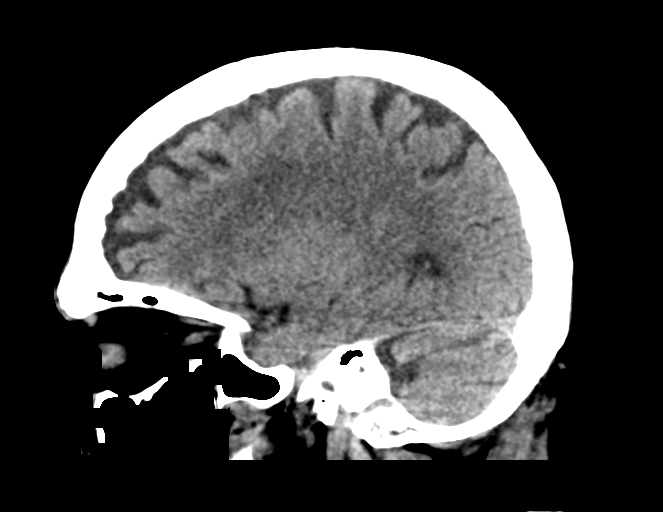

[16 of 47 positions shown; findings below may reference images not displayed]

FINDINGS: Brain: Cerebral volume loss since 0332 appears generalized. Patchy
and confluent white matter hypodensity which appears progressed
since the prior MRI. No midline shift, ventriculomegaly, mass
effect, evidence of mass lesion, intracranial hemorrhage or evidence
of cortically based acute infarction. No cortical encephalomalacia.

Vascular: Calcified atherosclerosis at the skull base. No suspicious
intracranial vascular hyperdensity.

Skull: Intact.  No acute osseous abnormality identified.

Sinuses/Orbits: Clear aside from mild maxillary sinus mucosal
thickening. Hyperplastic sinuses. Tympanic cavities and mastoids are
clear.

Other: No scalp hematoma identified. Visualized orbit soft tissues
are within normal limits.
IMPRESSION: 1.  No acute intracranial abnormality.
2. Generalized cerebral volume loss and progressed nonspecific white
matter changes since [DATE].
